# Patient Record
Sex: Female | Born: 1935
Health system: Southern US, Community
[De-identification: ages and names within clinical notes are randomized; demographics above are authoritative.]

## PROBLEM LIST (undated history)

## (undated) DIAGNOSIS — R011 Cardiac murmur, unspecified: Secondary | ICD-10-CM

## (undated) DIAGNOSIS — L608 Other nail disorders: Secondary | ICD-10-CM

## (undated) DIAGNOSIS — M1990 Unspecified osteoarthritis, unspecified site: Secondary | ICD-10-CM

## (undated) DIAGNOSIS — E1129 Type 2 diabetes mellitus with other diabetic kidney complication: Secondary | ICD-10-CM

## (undated) DIAGNOSIS — E875 Hyperkalemia: Secondary | ICD-10-CM

## (undated) DIAGNOSIS — N189 Chronic kidney disease, unspecified: Secondary | ICD-10-CM

## (undated) DIAGNOSIS — G4700 Insomnia, unspecified: Secondary | ICD-10-CM

## (undated) DIAGNOSIS — I739 Peripheral vascular disease, unspecified: Secondary | ICD-10-CM

## (undated) DIAGNOSIS — I10 Essential (primary) hypertension: Secondary | ICD-10-CM

## (undated) DIAGNOSIS — E785 Hyperlipidemia, unspecified: Secondary | ICD-10-CM

## (undated) DIAGNOSIS — R5381 Other malaise: Secondary | ICD-10-CM

## (undated) DIAGNOSIS — N390 Urinary tract infection, site not specified: Secondary | ICD-10-CM

## (undated) DIAGNOSIS — E11319 Type 2 diabetes mellitus with unspecified diabetic retinopathy without macular edema: Secondary | ICD-10-CM

## (undated) DIAGNOSIS — R5383 Other fatigue: Secondary | ICD-10-CM

## (undated) DIAGNOSIS — F419 Anxiety disorder, unspecified: Secondary | ICD-10-CM

## (undated) DIAGNOSIS — R112 Nausea with vomiting, unspecified: Secondary | ICD-10-CM

## (undated) DIAGNOSIS — R609 Edema, unspecified: Secondary | ICD-10-CM

## (undated) DIAGNOSIS — M13169 Monoarthritis, not elsewhere classified, unspecified knee: Secondary | ICD-10-CM

## (undated) DIAGNOSIS — F411 Generalized anxiety disorder: Secondary | ICD-10-CM

## (undated) DIAGNOSIS — S42309A Unspecified fracture of shaft of humerus, unspecified arm, initial encounter for closed fracture: Secondary | ICD-10-CM

## (undated) DIAGNOSIS — I8390 Asymptomatic varicose veins of unspecified lower extremity: Secondary | ICD-10-CM

## (undated) DIAGNOSIS — H9190 Unspecified hearing loss, unspecified ear: Secondary | ICD-10-CM

## (undated) DIAGNOSIS — R7989 Other specified abnormal findings of blood chemistry: Secondary | ICD-10-CM

## (undated) DIAGNOSIS — E042 Nontoxic multinodular goiter: Secondary | ICD-10-CM

## (undated) DIAGNOSIS — E663 Overweight: Secondary | ICD-10-CM

## (undated) DIAGNOSIS — N289 Disorder of kidney and ureter, unspecified: Secondary | ICD-10-CM

## (undated) DIAGNOSIS — Z9889 Other specified postprocedural states: Secondary | ICD-10-CM

## (undated) DIAGNOSIS — M25569 Pain in unspecified knee: Secondary | ICD-10-CM

## (undated) DIAGNOSIS — M25559 Pain in unspecified hip: Secondary | ICD-10-CM

## (undated) HISTORY — DX: Pain in unspecified knee: M25.569

## (undated) HISTORY — DX: Chronic kidney disease, unspecified: N18.9

## (undated) HISTORY — DX: Disorder of kidney and ureter, unspecified: N28.9

## (undated) HISTORY — DX: Hyperlipidemia, unspecified: E78.5

## (undated) HISTORY — DX: Hyperkalemia: E87.5

## (undated) HISTORY — DX: Pain in unspecified hip: M25.559

## (undated) HISTORY — DX: Type 2 diabetes mellitus with other diabetic kidney complication: E11.29

## (undated) HISTORY — DX: Urinary tract infection, site not specified: N39.0

## (undated) HISTORY — DX: Insomnia, unspecified: G47.00

## (undated) HISTORY — DX: Unspecified osteoarthritis, unspecified site: M19.90

## (undated) HISTORY — DX: Type 2 diabetes mellitus with unspecified diabetic retinopathy without macular edema: E11.319

## (undated) HISTORY — PX: LIPOMA EXCISION: SHX5283

## (undated) HISTORY — DX: Cardiac murmur, unspecified: R01.1

## (undated) HISTORY — DX: Unspecified fracture of shaft of humerus, unspecified arm, initial encounter for closed fracture: S42.309A

## (undated) HISTORY — DX: Other malaise: R53.83

## (undated) HISTORY — DX: Edema, unspecified: R60.9

## (undated) HISTORY — PX: EYE SURGERY: SHX253

## (undated) HISTORY — DX: Overweight: E66.3

## (undated) HISTORY — DX: Other malaise: R53.81

## (undated) HISTORY — DX: Other nail disorders: L60.8

## (undated) HISTORY — DX: Generalized anxiety disorder: F41.1

## (undated) HISTORY — PX: ABDOMINAL HYSTERECTOMY: SHX81

## (undated) HISTORY — DX: Other specified abnormal findings of blood chemistry: R79.89

## (undated) HISTORY — DX: Asymptomatic varicose veins of unspecified lower extremity: I83.90

## (undated) HISTORY — PX: TONSILLECTOMY: SUR1361

---
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
## 2004-10-27 ENCOUNTER — Ambulatory Visit: Payer: Self-pay

## 2008-10-13 ENCOUNTER — Emergency Department (HOSPITAL_COMMUNITY): Admission: EM | Admit: 2008-10-13 | Discharge: 2008-10-13 | Payer: Self-pay | Admitting: Emergency Medicine

## 2012-06-24 ENCOUNTER — Other Ambulatory Visit: Payer: Self-pay | Admitting: Internal Medicine

## 2012-06-24 DIAGNOSIS — I999 Unspecified disorder of circulatory system: Secondary | ICD-10-CM

## 2012-06-27 ENCOUNTER — Ambulatory Visit
Admission: RE | Admit: 2012-06-27 | Discharge: 2012-06-27 | Disposition: A | Discharge status: Home / Self Care | Payer: Medicare Other | Source: Ambulatory Visit | Attending: Internal Medicine | Admitting: Internal Medicine

## 2012-06-27 DIAGNOSIS — I999 Unspecified disorder of circulatory system: Secondary | ICD-10-CM

## 2012-09-16 ENCOUNTER — Ambulatory Visit
Admission: RE | Admit: 2012-09-16 | Discharge: 2012-09-16 | Disposition: A | Discharge status: Home / Self Care | Payer: Medicare Other | Source: Ambulatory Visit | Attending: Internal Medicine | Admitting: Internal Medicine

## 2012-09-16 ENCOUNTER — Other Ambulatory Visit: Payer: Self-pay | Admitting: Internal Medicine

## 2012-09-16 DIAGNOSIS — R52 Pain, unspecified: Secondary | ICD-10-CM

## 2012-09-16 DIAGNOSIS — R609 Edema, unspecified: Secondary | ICD-10-CM

## 2012-11-08 ENCOUNTER — Other Ambulatory Visit (HOSPITAL_COMMUNITY): Payer: Self-pay | Admitting: Orthopaedic Surgery

## 2012-11-12 ENCOUNTER — Encounter (HOSPITAL_COMMUNITY): Payer: Self-pay | Admitting: Pharmacy Technician

## 2012-11-15 NOTE — Patient Instructions (Addendum)
[place]  11/15/2012   Your procedure is scheduled on:  11/29/12 FRIDAY  Report to [place] at  [place]    AM.  Call this number if you have problems the morning of surgery: [phone]     DO NOT TAKE ANY DIABETES MEDICINES MORNING OF SURGERY  Remember:   Do not eat food  Or drink :After Midnight.THURSDAY NIGHT   Take these medicines the morning of surgery with A SIP OF WATER: ATENOLOL   .  Contacts, dentures or partial plates can not be worn to surgery  Leave suitcase in the car. After surgery it may be brought to your room.  For patients admitted to the hospital, checkout time is 11:00 AM day of  discharge.             SPECIAL INSTRUCTIONS- SEE [place] PREPARING FOR SURGERY INSTRUCTION SHEET-     DO NOT WEAR JEWELRY, LOTIONS, POWDERS, OR PERFUMES.  WOMEN-- DO NOT SHAVE LEGS OR UNDERARMS FOR 12 HOURS BEFORE SHOWERS. MEN MAY SHAVE FACE.  Patients discharged the day of surgery will not be allowed to drive home. IF going home the day of surgery, you must have a driver and someone to stay with you for the first 24 hours  Name and phone number of your driver:  admission                                                                      Please read over the following fact sheets that you were given: MRSA Information, Incentive Spirometry Sheet, Blood Transfusion Sheet  Information                                                                                   [name]  PST 336  [phone]                 [place]                                                  Patient Signature _____________________________

## 2012-11-18 ENCOUNTER — Ambulatory Visit (HOSPITAL_COMMUNITY)
Admission: RE | Admit: 2012-11-18 | Discharge: 2012-11-18 | Disposition: A | Discharge status: Home / Self Care | Payer: Medicare Other | Source: Ambulatory Visit | Attending: Orthopaedic Surgery | Admitting: Orthopaedic Surgery

## 2012-11-18 ENCOUNTER — Encounter (HOSPITAL_COMMUNITY): Payer: Self-pay

## 2012-11-18 ENCOUNTER — Encounter (HOSPITAL_COMMUNITY)
Admission: RE | Admit: 2012-11-18 | Discharge: 2012-11-18 | Disposition: A | Discharge status: Home / Self Care | Payer: Medicare Other | Source: Ambulatory Visit | Attending: Orthopaedic Surgery | Admitting: Orthopaedic Surgery

## 2012-11-18 DIAGNOSIS — I700 Atherosclerosis of aorta: Secondary | ICD-10-CM | POA: Insufficient documentation

## 2012-11-18 DIAGNOSIS — Z01810 Encounter for preprocedural cardiovascular examination: Secondary | ICD-10-CM | POA: Insufficient documentation

## 2012-11-18 DIAGNOSIS — Z01812 Encounter for preprocedural laboratory examination: Secondary | ICD-10-CM | POA: Insufficient documentation

## 2012-11-18 DIAGNOSIS — R9431 Abnormal electrocardiogram [ECG] [EKG]: Secondary | ICD-10-CM | POA: Insufficient documentation

## 2012-11-18 DIAGNOSIS — Z01818 Encounter for other preprocedural examination: Secondary | ICD-10-CM | POA: Insufficient documentation

## 2012-11-18 HISTORY — DX: Peripheral vascular disease, unspecified: I73.9

## 2012-11-18 HISTORY — DX: Essential (primary) hypertension: I10

## 2012-11-18 HISTORY — DX: Anxiety disorder, unspecified: F41.9

## 2012-11-18 HISTORY — DX: Unspecified osteoarthritis, unspecified site: M19.90

## 2012-11-18 LAB — CBC
HCT: 36.2 % (ref 36.0–46.0)
Hemoglobin: 12.2 g/dL (ref 12.0–15.0)
MCH: 30.7 pg (ref 26.0–34.0)
MCHC: 33.7 g/dL (ref 30.0–36.0)

## 2012-11-18 LAB — PROTIME-INR: INR: 0.88 (ref 0.00–1.49)

## 2012-11-18 LAB — BASIC METABOLIC PANEL
BUN: 22 mg/dL (ref 6–23)
Chloride: 103 mEq/L (ref 96–112)
Glucose, Bld: 129 mg/dL — ABNORMAL HIGH (ref 70–99)
Potassium: 4.2 mEq/L (ref 3.5–5.1)

## 2012-11-18 LAB — APTT: aPTT: 29 seconds (ref 24–37)

## 2012-11-18 LAB — URINE MICROSCOPIC-ADD ON

## 2012-11-18 LAB — URINALYSIS, ROUTINE W REFLEX MICROSCOPIC
Bilirubin Urine: NEGATIVE
Glucose, UA: NEGATIVE mg/dL
Ketones, ur: NEGATIVE mg/dL
Protein, ur: NEGATIVE mg/dL

## 2012-11-18 LAB — ABO/RH: ABO/RH(D): A NEG

## 2012-11-18 NOTE — Progress Notes (Signed)
Faxed u/a with micro to Dr Ninfa Linden with confirmation= also notified Judeen Hammans at his office of impending fax

## 2012-11-28 NOTE — Anesthesia Preprocedure Evaluation (Addendum)
Anesthesia Evaluation  Patient identified by MRN, date of birth, ID band Patient awake    Reviewed: Allergy & Precautions, H&P , NPO status , Patient's Chart, lab work & pertinent test results  Airway Mallampati: III TM Distance: >3 FB Neck ROM: Full    Dental  (+) Edentulous Upper and Partial Lower   Pulmonary neg pulmonary ROS,  breath sounds clear to auscultation  Pulmonary exam normal       Cardiovascular hypertension, Pt. on medications and Pt. on home beta blockers + Peripheral Vascular Disease Rhythm:Regular Rate:Normal     Neuro/Psych Anxiety negative neurological ROS     GI/Hepatic negative GI ROS, Neg liver ROS,   Endo/Other  diabetes, Type 2, Oral Hypoglycemic Agents  Renal/GU negative Renal ROS  negative genitourinary   Musculoskeletal negative musculoskeletal ROS (+)   Abdominal   Peds  Hematology negative hematology ROS (+)   Anesthesia Other Findings   Reproductive/Obstetrics                          Anesthesia Physical Anesthesia Plan  ASA: II  Anesthesia Plan: General   Post-op Pain Management:    Induction: Intravenous  Airway Management Planned: Oral ETT  Additional Equipment:   Intra-op Plan:   Post-operative Plan: Extubation in OR  Informed Consent: I have reviewed the patients History and Physical, chart, labs and discussed the procedure including the risks, benefits and alternatives for the proposed anesthesia with the patient or authorized representative who has indicated his/her understanding and acceptance.   Dental advisory given  Plan Discussed with: CRNA  Anesthesia Plan Comments:         Anesthesia Quick Evaluation

## 2012-11-29 ENCOUNTER — Inpatient Hospital Stay (HOSPITAL_COMMUNITY): Payer: Medicare Other

## 2012-11-29 ENCOUNTER — Encounter (HOSPITAL_COMMUNITY): Payer: Self-pay | Admitting: *Deleted

## 2012-11-29 ENCOUNTER — Inpatient Hospital Stay (HOSPITAL_COMMUNITY)
Admission: RE | Admit: 2012-11-29 | Discharge: 2012-12-02 | DRG: 470 | Disposition: A | Discharge status: Home Health | Payer: Medicare Other | Source: Ambulatory Visit | Attending: Orthopaedic Surgery | Admitting: Orthopaedic Surgery

## 2012-11-29 ENCOUNTER — Encounter (HOSPITAL_COMMUNITY): Payer: Self-pay | Admitting: Anesthesiology

## 2012-11-29 ENCOUNTER — Encounter (HOSPITAL_COMMUNITY)
Admission: RE | Disposition: A | Discharge status: Home Health | Payer: Self-pay | Source: Ambulatory Visit | Attending: Orthopaedic Surgery

## 2012-11-29 ENCOUNTER — Inpatient Hospital Stay (HOSPITAL_COMMUNITY): Payer: Medicare Other | Admitting: Anesthesiology

## 2012-11-29 DIAGNOSIS — F411 Generalized anxiety disorder: Secondary | ICD-10-CM | POA: Diagnosis present

## 2012-11-29 DIAGNOSIS — M169 Osteoarthritis of hip, unspecified: Secondary | ICD-10-CM

## 2012-11-29 DIAGNOSIS — M1610 Unilateral primary osteoarthritis, unspecified hip: Principal | ICD-10-CM | POA: Diagnosis present

## 2012-11-29 DIAGNOSIS — D62 Acute posthemorrhagic anemia: Secondary | ICD-10-CM | POA: Diagnosis not present

## 2012-11-29 DIAGNOSIS — I10 Essential (primary) hypertension: Secondary | ICD-10-CM | POA: Diagnosis present

## 2012-11-29 DIAGNOSIS — E119 Type 2 diabetes mellitus without complications: Secondary | ICD-10-CM | POA: Diagnosis present

## 2012-11-29 HISTORY — PX: TOTAL HIP ARTHROPLASTY: SHX124

## 2012-11-29 LAB — GLUCOSE, CAPILLARY
Glucose-Capillary: 120 mg/dL — ABNORMAL HIGH (ref 70–99)
Glucose-Capillary: 144 mg/dL — ABNORMAL HIGH (ref 70–99)
Glucose-Capillary: 218 mg/dL — ABNORMAL HIGH (ref 70–99)
Glucose-Capillary: 237 mg/dL — ABNORMAL HIGH (ref 70–99)
Glucose-Capillary: 284 mg/dL — ABNORMAL HIGH (ref 70–99)

## 2012-11-29 SURGERY — ARTHROPLASTY, HIP, TOTAL, ANTERIOR APPROACH
Anesthesia: General | Site: Hip | Laterality: Right | Wound class: Clean

## 2012-11-29 MED ORDER — HYDROMORPHONE HCL PF 1 MG/ML IJ SOLN
INTRAMUSCULAR | Status: DC | PRN
  Administered 2012-11-29 (×2): 1 mg via INTRAVENOUS

## 2012-11-29 MED ORDER — METHOCARBAMOL 100 MG/ML IJ SOLN: 500.0000 mg | Freq: Four times a day (QID) | INTRAVENOUS | Status: DC | PRN

## 2012-11-29 MED ORDER — FENTANYL CITRATE 0.05 MG/ML IJ SOLN
INTRAMUSCULAR | Status: DC | PRN
  Administered 2012-11-29 (×2): 25 ug via INTRAVENOUS

## 2012-11-29 MED ORDER — METOCLOPRAMIDE HCL 5 MG/ML IJ SOLN: 5.0000 mg | Freq: Three times a day (TID) | INTRAMUSCULAR | Status: DC | PRN

## 2012-11-29 MED ORDER — SODIUM CHLORIDE 0.9 % IR SOLN
Status: DC | PRN
  Administered 2012-11-29: 1000 mL

## 2012-11-29 MED ORDER — ACETAMINOPHEN 325 MG PO TABS: 650.0000 mg | ORAL_TABLET | Freq: Four times a day (QID) | ORAL | Status: DC | PRN

## 2012-11-29 MED ORDER — FERROUS SULFATE 325 (65 FE) MG PO TABS
325.0000 mg | ORAL_TABLET | Freq: Three times a day (TID) | ORAL | Status: DC
  Administered 2012-11-29 – 2012-12-02 (×9): 325 mg via ORAL
  Filled 2012-11-29 (×11): qty 1

## 2012-11-29 MED ORDER — LACTATED RINGERS IV SOLN
INTRAVENOUS | Status: DC | PRN
  Administered 2012-11-29 (×3): via INTRAVENOUS

## 2012-11-29 MED ORDER — PHENYLEPHRINE HCL 10 MG/ML IJ SOLN
INTRAMUSCULAR | Status: DC | PRN
  Administered 2012-11-29 (×2): 40 ug via INTRAVENOUS

## 2012-11-29 MED ORDER — ATENOLOL 50 MG PO TABS
75.0000 mg | ORAL_TABLET | Freq: Two times a day (BID) | ORAL | Status: DC
  Administered 2012-11-29 – 2012-12-02 (×5): 75 mg via ORAL
  Filled 2012-11-29 (×8): qty 1

## 2012-11-29 MED ORDER — GLIPIZIDE 5 MG PO TABS
5.0000 mg | ORAL_TABLET | Freq: Two times a day (BID) | ORAL | Status: DC
  Administered 2012-11-29 – 2012-12-01 (×5): 5 mg via ORAL
  Filled 2012-11-29 (×8): qty 1

## 2012-11-29 MED ORDER — SODIUM CHLORIDE 0.9 % IV SOLN
INTRAVENOUS | Status: DC
  Administered 2012-11-29: 75 mL/h via INTRAVENOUS
  Administered 2012-11-30 (×2): via INTRAVENOUS

## 2012-11-29 MED ORDER — DOCUSATE SODIUM 100 MG PO CAPS
100.0000 mg | ORAL_CAPSULE | Freq: Two times a day (BID) | ORAL | Status: DC
  Administered 2012-11-29 – 2012-12-02 (×7): 100 mg via ORAL

## 2012-11-29 MED ORDER — HYDRALAZINE HCL 20 MG/ML IJ SOLN
INTRAMUSCULAR | Status: DC | PRN
  Administered 2012-11-29 (×2): 4 mg via INTRAVENOUS

## 2012-11-29 MED ORDER — ZOLPIDEM TARTRATE 5 MG PO TABS: 5.0000 mg | ORAL_TABLET | Freq: Every evening | ORAL | Status: DC | PRN

## 2012-11-29 MED ORDER — FUROSEMIDE 20 MG PO TABS
20.0000 mg | ORAL_TABLET | Freq: Every morning | ORAL | Status: DC
Start: 2012-11-29 — End: 2012-12-02
  Administered 2012-11-29: 20 mg via ORAL
  Filled 2012-11-29 (×4): qty 1

## 2012-11-29 MED ORDER — ACETAMINOPHEN 650 MG RE SUPP: 650.0000 mg | Freq: Four times a day (QID) | RECTAL | Status: DC | PRN

## 2012-11-29 MED ORDER — MIDAZOLAM HCL 5 MG/5ML IJ SOLN
INTRAMUSCULAR | Status: DC | PRN
  Administered 2012-11-29 (×2): 1 mg via INTRAVENOUS

## 2012-11-29 MED ORDER — ACETAMINOPHEN-CODEINE #3 300-30 MG PO TABS
1.0000 | ORAL_TABLET | ORAL | Status: DC | PRN
  Administered 2012-12-01: 1 via ORAL
  Administered 2012-12-02: 2 via ORAL
  Filled 2012-11-29: qty 1
  Filled 2012-11-29: qty 2

## 2012-11-29 MED ORDER — INSULIN ASPART 100 UNIT/ML ~~LOC~~ SOLN
0.0000 [IU] | Freq: Every day | SUBCUTANEOUS | Status: DC
  Administered 2012-11-29: 3 [IU] via SUBCUTANEOUS

## 2012-11-29 MED ORDER — LOSARTAN POTASSIUM 25 MG PO TABS
25.0000 mg | ORAL_TABLET | Freq: Two times a day (BID) | ORAL | Status: DC
  Administered 2012-11-29 – 2012-12-01 (×3): 25 mg via ORAL
  Filled 2012-11-29 (×8): qty 1

## 2012-11-29 MED ORDER — LACTATED RINGERS IV SOLN: INTRAVENOUS | Status: DC

## 2012-11-29 MED ORDER — DEXAMETHASONE SODIUM PHOSPHATE 4 MG/ML IJ SOLN
INTRAMUSCULAR | Status: DC | PRN
  Administered 2012-11-29: 10 mg via INTRAVENOUS

## 2012-11-29 MED ORDER — METOCLOPRAMIDE HCL 5 MG/ML IJ SOLN
INTRAMUSCULAR | Status: DC | PRN
  Administered 2012-11-29 (×2): 5 mg via INTRAVENOUS

## 2012-11-29 MED ORDER — METFORMIN HCL 500 MG PO TABS
1000.0000 mg | ORAL_TABLET | Freq: Two times a day (BID) | ORAL | Status: DC
  Administered 2012-11-29 – 2012-12-01 (×5): 1000 mg via ORAL
  Filled 2012-11-29 (×8): qty 2

## 2012-11-29 MED ORDER — EPHEDRINE SULFATE 50 MG/ML IJ SOLN
INTRAMUSCULAR | Status: DC | PRN
  Administered 2012-11-29: 10 mg via INTRAVENOUS
  Administered 2012-11-29: 5 mg via INTRAVENOUS
  Administered 2012-11-29: 10 mg via INTRAVENOUS
  Administered 2012-11-29: 5 mg via INTRAVENOUS

## 2012-11-29 MED ORDER — METHOCARBAMOL 500 MG PO TABS
500.0000 mg | ORAL_TABLET | Freq: Four times a day (QID) | ORAL | Status: DC | PRN
  Administered 2012-11-29 – 2012-12-01 (×5): 500 mg via ORAL
  Filled 2012-11-29 (×6): qty 1

## 2012-11-29 MED ORDER — CEFAZOLIN SODIUM 1-5 GM-% IV SOLN
1.0000 g | Freq: Four times a day (QID) | INTRAVENOUS | Status: AC
  Administered 2012-11-29 (×2): 1 g via INTRAVENOUS
  Filled 2012-11-29 (×2): qty 50

## 2012-11-29 MED ORDER — PHENOL 1.4 % MT LIQD: 1.0000 | OROMUCOSAL | Status: DC | PRN

## 2012-11-29 MED ORDER — PROPOFOL INFUSION 10 MG/ML OPTIME
INTRAVENOUS | Status: DC | PRN
  Administered 2012-11-29: 75 ug/kg/min via INTRAVENOUS

## 2012-11-29 MED ORDER — ONDANSETRON HCL 4 MG PO TABS
4.0000 mg | ORAL_TABLET | Freq: Four times a day (QID) | ORAL | Status: DC | PRN
  Administered 2012-12-01: 4 mg via ORAL
  Filled 2012-11-29: qty 1

## 2012-11-29 MED ORDER — CEFAZOLIN SODIUM-DEXTROSE 2-3 GM-% IV SOLR
2.0000 g | INTRAVENOUS | Status: AC
  Administered 2012-11-29: 2 g via INTRAVENOUS

## 2012-11-29 MED ORDER — POLYVINYL ALCOHOL 1.4 % OP SOLN
1.0000 [drp] | Freq: Two times a day (BID) | OPHTHALMIC | Status: DC | PRN
  Filled 2012-11-29 (×2): qty 15

## 2012-11-29 MED ORDER — DIPHENHYDRAMINE HCL 12.5 MG/5ML PO ELIX: 12.5000 mg | ORAL_SOLUTION | ORAL | Status: DC | PRN

## 2012-11-29 MED ORDER — HYDROMORPHONE HCL PF 1 MG/ML IJ SOLN: 1.0000 mg | INTRAMUSCULAR | Status: DC | PRN

## 2012-11-29 MED ORDER — 0.9 % SODIUM CHLORIDE (POUR BTL) OPTIME
TOPICAL | Status: DC | PRN
  Administered 2012-11-29: 1000 mL

## 2012-11-29 MED ORDER — ACETAMINOPHEN 10 MG/ML IV SOLN
INTRAVENOUS | Status: DC | PRN
  Administered 2012-11-29: 1000 mg via INTRAVENOUS

## 2012-11-29 MED ORDER — ATORVASTATIN CALCIUM 20 MG PO TABS
20.0000 mg | ORAL_TABLET | Freq: Every day | ORAL | Status: DC
  Administered 2012-11-29 – 2012-12-01 (×3): 20 mg via ORAL
  Filled 2012-11-29 (×4): qty 1

## 2012-11-29 MED ORDER — INSULIN ASPART 100 UNIT/ML ~~LOC~~ SOLN
0.0000 [IU] | Freq: Three times a day (TID) | SUBCUTANEOUS | Status: DC
  Administered 2012-11-30: 3 [IU] via SUBCUTANEOUS
  Administered 2012-11-30 – 2012-12-01 (×4): 2 [IU] via SUBCUTANEOUS
  Administered 2012-12-01: 3 [IU] via SUBCUTANEOUS

## 2012-11-29 MED ORDER — OXYCODONE HCL ER 10 MG PO T12A
10.0000 mg | EXTENDED_RELEASE_TABLET | Freq: Two times a day (BID) | ORAL | Status: DC
  Administered 2012-11-29 – 2012-12-02 (×6): 10 mg via ORAL
  Filled 2012-11-29 (×6): qty 1

## 2012-11-29 MED ORDER — FENTANYL CITRATE 0.05 MG/ML IJ SOLN: 25.0000 ug | INTRAMUSCULAR | Status: DC | PRN

## 2012-11-29 MED ORDER — RIVAROXABAN 10 MG PO TABS
10.0000 mg | ORAL_TABLET | Freq: Every day | ORAL | Status: DC
  Administered 2012-11-30 – 2012-12-02 (×3): 10 mg via ORAL
  Filled 2012-11-29 (×4): qty 1

## 2012-11-29 MED ORDER — ONDANSETRON HCL 4 MG/2ML IJ SOLN
INTRAMUSCULAR | Status: DC | PRN
  Administered 2012-11-29: 4 mg via INTRAVENOUS

## 2012-11-29 MED ORDER — PROPOFOL 10 MG/ML IV BOLUS
INTRAVENOUS | Status: DC | PRN
  Administered 2012-11-29: 150 mg via INTRAVENOUS

## 2012-11-29 MED ORDER — ROCURONIUM BROMIDE 100 MG/10ML IV SOLN
INTRAVENOUS | Status: DC | PRN
  Administered 2012-11-29: 50 mg via INTRAVENOUS

## 2012-11-29 MED ORDER — METOCLOPRAMIDE HCL 5 MG PO TABS
5.0000 mg | ORAL_TABLET | Freq: Three times a day (TID) | ORAL | Status: DC | PRN
  Filled 2012-11-29: qty 2

## 2012-11-29 MED ORDER — OXYCODONE HCL 5 MG PO TABS
5.0000 mg | ORAL_TABLET | ORAL | Status: DC | PRN
  Administered 2012-11-29 – 2012-11-30 (×4): 5 mg via ORAL
  Administered 2012-11-30 (×3): 10 mg via ORAL
  Administered 2012-11-30: 5 mg via ORAL
  Administered 2012-11-30 – 2012-12-01 (×3): 10 mg via ORAL
  Administered 2012-12-01: 5 mg via ORAL
  Filled 2012-11-29 (×2): qty 2
  Filled 2012-11-29 (×2): qty 1
  Filled 2012-11-29: qty 2
  Filled 2012-11-29 (×3): qty 1
  Filled 2012-11-29: qty 2
  Filled 2012-11-29: qty 1
  Filled 2012-11-29 (×2): qty 2

## 2012-11-29 MED ORDER — MENTHOL 3 MG MT LOZG: 1.0000 | LOZENGE | OROMUCOSAL | Status: DC | PRN

## 2012-11-29 MED ORDER — GLYCOPYRROLATE 0.2 MG/ML IJ SOLN
INTRAMUSCULAR | Status: DC | PRN
  Administered 2012-11-29: 0.3 mg via INTRAVENOUS

## 2012-11-29 MED ORDER — ONDANSETRON HCL 4 MG/2ML IJ SOLN
4.0000 mg | Freq: Four times a day (QID) | INTRAMUSCULAR | Status: DC | PRN
  Administered 2012-11-29: 4 mg via INTRAVENOUS
  Filled 2012-11-29: qty 2

## 2012-11-29 MED ORDER — NEOSTIGMINE METHYLSULFATE 1 MG/ML IJ SOLN
INTRAMUSCULAR | Status: DC | PRN
  Administered 2012-11-29: 3 mg via INTRAVENOUS

## 2012-11-29 SURGICAL SUPPLY — 37 items
ADH SKN CLS APL DERMABOND .7 (GAUZE/BANDAGES/DRESSINGS) ×1
BAG SPEC THK2 15X12 ZIP CLS (MISCELLANEOUS) ×1
BAG ZIPLOCK 12X15 (MISCELLANEOUS) ×2 IMPLANT
BLADE SAW SGTL 18X1.27X75 (BLADE) ×2 IMPLANT
CELLS DAT CNTRL 66122 CELL SVR (MISCELLANEOUS) ×1 IMPLANT
CLOTH BEACON ORANGE TIMEOUT ST (SAFETY) ×2 IMPLANT
DERMABOND ADVANCED (GAUZE/BANDAGES/DRESSINGS) ×1
DERMABOND ADVANCED .7 DNX12 (GAUZE/BANDAGES/DRESSINGS) ×1 IMPLANT
DRAPE C-ARM 42X72 X-RAY (DRAPES) ×2 IMPLANT
DRAPE STERI IOBAN 125X83 (DRAPES) ×2 IMPLANT
DRAPE U-SHAPE 47X51 STRL (DRAPES) ×6 IMPLANT
DRSG AQUACEL AG ADV 3.5X10 (GAUZE/BANDAGES/DRESSINGS) ×2 IMPLANT
DURAPREP 26ML APPLICATOR (WOUND CARE) ×2 IMPLANT
ELECT BLADE TIP CTD 4 INCH (ELECTRODE) ×2 IMPLANT
ELECT REM PT RETURN 9FT ADLT (ELECTROSURGICAL) ×2
ELECTRODE REM PT RTRN 9FT ADLT (ELECTROSURGICAL) ×1 IMPLANT
FACESHIELD LNG OPTICON STERILE (SAFETY) ×8 IMPLANT
GLOVE BIO SURGEON STRL SZ7.5 (GLOVE) ×2 IMPLANT
GLOVE BIOGEL PI IND STRL 8 (GLOVE) ×3 IMPLANT
GLOVE BIOGEL PI INDICATOR 8 (GLOVE) ×2
GLOVE ECLIPSE 8.0 STRL XLNG CF (GLOVE) ×3 IMPLANT
GOWN STRL REIN XL XLG (GOWN DISPOSABLE) ×4 IMPLANT
HANDPIECE INTERPULSE COAX TIP (DISPOSABLE) ×2
KIT BASIN OR (CUSTOM PROCEDURE TRAY) ×2 IMPLANT
PACK TOTAL JOINT (CUSTOM PROCEDURE TRAY) ×2 IMPLANT
PADDING CAST COTTON 6X4 STRL (CAST SUPPLIES) ×2 IMPLANT
RTRCTR WOUND ALEXIS 18CM MED (MISCELLANEOUS) ×2
SET HNDPC FAN SPRY TIP SCT (DISPOSABLE) ×1 IMPLANT
SUT ETHIBOND NAB CT1 #1 30IN (SUTURE) ×2 IMPLANT
SUT MNCRL AB 4-0 PS2 18 (SUTURE) ×2 IMPLANT
SUT NYLON 3 0 (SUTURE) ×2 IMPLANT
SUT VIC AB 1 CT1 36 (SUTURE) ×4 IMPLANT
SUT VIC AB 2-0 CT1 27 (SUTURE) ×4
SUT VIC AB 2-0 CT1 TAPERPNT 27 (SUTURE) ×2 IMPLANT
TOWEL OR 17X26 10 PK STRL BLUE (TOWEL DISPOSABLE) ×4 IMPLANT
TOWEL OR NON WOVEN STRL DISP B (DISPOSABLE) ×2 IMPLANT
TRAY FOLEY CATH 14FRSI W/METER (CATHETERS) ×2 IMPLANT

## 2012-11-29 NOTE — Transfer of Care (Signed)
Immediate Anesthesia Transfer of Care Note  Patient: Julie Baxter  Procedure(s) Performed: Procedure(s): RIGHT TOTAL HIP ARTHROPLASTY ANTERIOR APPROACH (Right)  Patient Location: PACU  Anesthesia Type:General  Level of Consciousness: awake, alert , oriented, patient cooperative and responds to stimulation  Airway & Oxygen Therapy: Patient Spontanous Breathing and Patient connected to face mask  Post-op Assessment: Report given to PACU RN, Post -op Vital signs reviewed and stable and Patient moving all extremities  Post vital signs: Reviewed and stable  Complications: No apparent anesthesia complications

## 2012-11-29 NOTE — Progress Notes (Signed)
Utilization review completed.  

## 2012-11-29 NOTE — H&P (Signed)
TOTAL HIP ADMISSION H&P  Patient is admitted for right total hip arthroplasty.  Subjective:  Chief Complaint: right hip pain  HPI: [place], 77 y.o. female, has a history of pain and functional disability in the right hip(s) due to arthritis and patient has failed non-surgical conservative treatments for greater than 12 weeks to include NSAID's and/or analgesics, use of assistive devices and activity modification.  Onset of symptoms was abrupt starting 1 years ago with rapidlly worsening course since that time.The patient noted no past surgery on the right hip(s).  Patient currently rates pain in the right hip at 9 out of 10 with activity. Patient has night pain, worsening of pain with activity and weight bearing, trendelenberg gait, pain that interfers with activities of daily living, pain with passive range of motion and crepitus. Patient has evidence of subchondral cysts, subchondral sclerosis, periarticular osteophytes and joint space narrowing by imaging studies. This condition presents safety issues increasing the risk of falls.  There is no current active infection.  Patient Active Problem List   Diagnosis Date Noted  . Degenerative arthritis of hip 11/29/2012   Past Medical History  Diagnosis Date  . Hypertension   . Anxiety     regarding surgery  . Diabetes mellitus without complication   . Peripheral vascular disease     varicose veins-  "Blood Clot anterior RIGHT LEG WITH PREGNANCY"  . Arthritis     Past Surgical History  Procedure Laterality Date  . Abdominal hysterectomy    . Eye surgery Bilateral     cataract extraction with IOL  . Spinal meningitis  1957  . Tonsillectomy    . Lipoma excision      x2  bilateral buttocks    Prescriptions prior to admission  Medication Sig Dispense Refill  . aspirin EC 81 MG tablet Take 81 mg by mouth every evening.      Marland Kitchen atenolol (TENORMIN) 50 MG tablet Take 75 mg by mouth 2 (two) times daily.      . furosemide (LASIX) 20  MG tablet Take 20 mg by mouth every morning.      Marland Kitchen glipiZIDE (GLUCOTROL) 5 MG tablet Take 5 mg by mouth 2 (two) times daily before a meal.      . losartan (COZAAR) 25 MG tablet Take 25 mg by mouth 2 (two) times daily.      . metFORMIN (GLUCOPHAGE) 500 MG tablet Take 1,000 mg by mouth 2 (two) times daily with a meal.      . methylcellulose (ARTIFICIAL TEARS) 1 % ophthalmic solution Place 1 drop into both eyes 2 (two) times daily as needed (dry eyes).      . rosuvastatin (CRESTOR) 10 MG tablet Take 10 mg by mouth every evening.      Marland Kitchen OVER THE COUNTER MEDICATION Take 3 tablets by mouth daily as needed. Restful legs       Allergies  Allergen Reactions  . Naproxen Swelling and Other (See Comments)    Blacked out   . Tramadol Nausea And Vomiting    History  Substance Use Topics  . Smoking status: Never Smoker   . Smokeless tobacco: Never Used  . Alcohol Use: No    History reviewed. No pertinent family history.   Review of Systems  Musculoskeletal: Positive for joint pain.  All other systems reviewed and are negative.    Objective:  Physical Exam  Constitutional: She is oriented to person, place, and time. She appears well-developed and well-nourished.  HENT:  Head: Normocephalic and atraumatic.  Eyes: EOM are normal. Pupils are equal, round, and reactive to light.  Neck: Normal range of motion. Neck supple.  Cardiovascular: Normal rate and regular rhythm.   Respiratory: Effort normal and breath sounds normal.  GI: Soft. Bowel sounds are normal.  Musculoskeletal:       Right hip: She exhibits decreased range of motion, decreased strength, bony tenderness and crepitus.  Neurological: She is alert and oriented to person, place, and time.  Skin: Skin is warm and dry.  Psychiatric: She has a normal mood and affect.    Vital signs in last 24 hours: Temp:  [98.3 F (36.8 C)] 98.3 F (36.8 C) (04/04 0527) Pulse Rate:  [71] 71 (04/04 0527) Resp:  [18] 18 (04/04 0527) BP:  (185)/(92) 185/92 mmHg (04/04 0527) SpO2:  [100 %] 100 % (04/04 0527)  Labs:   There is no weight on file to calculate BMI.   Imaging Review Plain radiographs demonstrate moderate degenerative joint disease of the right hip(s). The bone quality appears to be good for age and reported activity level.  Assessment/Plan:  End stage arthritis, right hip(s)  The patient history, physical examination, clinical judgement of the provider and imaging studies are consistent with end stage degenerative joint disease of the right hip(s) and total hip arthroplasty is deemed medically necessary. The treatment options including medical management, injection therapy, arthroscopy and arthroplasty were discussed at length. The risks and benefits of total hip arthroplasty were presented and reviewed. The risks due to aseptic loosening, infection, stiffness, dislocation/subluxation,  thromboembolic complications and other imponderables were discussed.  The patient acknowledged the explanation, agreed to proceed with the plan and consent was signed. Patient is being admitted for inpatient treatment for surgery, pain control, PT, OT, prophylactic antibiotics, VTE prophylaxis, progressive ambulation and ADL's and discharge planning.The patient is planning to be discharged to skilled nursing facility

## 2012-11-29 NOTE — Op Note (Signed)
Julie Baxter, Julie Baxter NO.:  [id number]  MEDICAL RECORD NO.:  [id number]  LOCATION:  91                         FACILITY:  [facility]  PHYSICIAN:  Lind Guest. Ninfa Linden, M.D.DATE OF BIRTH:  [birth year]  DATE OF PROCEDURE:  11/29/2012 DATE OF DISCHARGE:                              OPERATIVE REPORT   PREOPERATIVE DIAGNOSIS:  Severe end-stage arthritis and degenerative joint disease, right hip.  POSTOPERATIVE DIAGNOSIS:  Severe end-stage arthritis and degenerative joint disease, right hip.  PROCEDURE:  Right total hip arthroplasty through direct anterior approach.  IMPLANTS:  DePuy Sector Gription acetabular component size 50, size 32+ 4 neutral polyethylene liner, size 11 Corail femoral component with a varus offset (KLA), size 32+ 1 metal hip ball.  SURGEON:  Lind Guest. Ninfa Linden, M.D.  ASSISTANT:  Erskine Emery, P.A.C.  ANESTHESIA:  General.  ESTIMATED BLOOD LOSS:  350-400 mL.  ANTIBIOTICS:  2 g IV Ancef.  COMPLICATIONS:  None.  INDICATIONS:  Ms. Medsker is a 77 year old female, well known to me. She has debilitating pain in her right hip and known end-stage arthritis.  She has tried all types of conservative treatment, and this has not gotten better for her.  At that point it has gotten to where her pain is daily.  Her activities are barely limited.  Her mobility is severely limited.  She wished to proceed with a total hip arthroplasty. She understands the risks of acute blood loss anemia, infection, fracture, nerve injury, DVT, and PE.  The goals or decreased pain and improved mobility and function as well as improved quality of life.  PROCEDURE DESCRIPTION:  After informed consent was obtained, appropriate right hip was marked.  She was brought to the operating room.  General anesthesia was obtained while she was on her stretcher.  Foley catheter was placed and then both feet had traction boots applied to the them. She was then placed supine  on the Hana fracture table with perineal post in place and both feet in line with skeletal traction, but no traction applied.  Her right operative hip was then prepped and draped with DuraPrep and sterile drapes.  Time-out was called.  She was identified as correct patient, correct right hip.  We then made an incision inferior and posterior to the anterior superior iliac spine, and dissected down to the tensor fascia lata.  The tensor fascia was then divided longitudinally, and we proceeded with a direct anterior approach to the hip.  A Cobra retractor was placed around the lateral neck, and up underneath the rectus femoris.  A Cobra retractor was placed medially.  We cauterized the lateral femoral circumflex vessels and then opened up the hip capsule in the L-type format.  The fusion was encounter, and we could see right away that she had severe sclerotic changes in the femoral head.  We placed a Cobra retractors within the joint capsule.  Then, I made my femoral neck cut just proximal to the lesser trochanter with the oscillating saw and completed this with an osteotome.  We placed a corkscrew guide to the femoral head and removed the femoral head in its entirety and found a large section devoid of cartilage with hard sclerotic bone, which  has been going on for years. We then placed the Bent Hohmann medially over the acetabular rim and a Cobra retractor laterally.  I cleaned the acetabulum debris and remnants of acetabular labrum.  We then began reaming from size 42 reamer in 2 mm increments up to a size 50 with all reamers placed under direct visualization and the last reamer placed under direct fluoroscopy so we could obtain better reaming, inclination, and version.  Once I was pleased with this, we placed the real DePuy Sector Gription acetabular component size 50, and again was pleased with our alignment, we knocked this into place with a mallet.  I then placed the apex hole  eliminator guide and the real polyethylene liner.  Attention was then turned to the femur.  With the leg externally rotated to 90 degrees, extended and adducted, placed the Mueller retractor medially and Hohmann retractor behind the greater trochanter, I released the lateral joint capsule.  I used a box cutting guide and a rongeur to open up the femoral canal and to lateralize side.  I then began broaching with a size 8 broach up to a size 11.  We trialed a standard and a varus neck and brought the leg back up and over it with a 32+ 1 trial hip ball.  We brought the leg into reduced position.  She was stable with internal-external rotation with minimal shuck, and her leg lengths were measured towards where she was just slightly long on the right side.  We then dislocated the hip and removed the trial components.  I lateralized a little more so we could just improve her alignment and then placed the real Corail femoral component from DePuy size 11 with varus offset and the real 32+ 1 metal hip ball.  We then brought the leg back up and over traction and internal rotation, reducing the pelvis.  Again, it was stable.  We copiously irrigated the soft tissues with normal saline solution and got a final pictures under direct fluoroscopic guidance.  We then closed the joint capsule with interrupted #1 Ethibond suture followed by a running #1 Vicryl in the tensor fascia; 0 and 2-0 Vicryl in the deep and subcutaneous tissue.  Subcuticular 4-0 Monocryl with Dermabond on the skin.  A well-padded dressing was applied.  She was taken off the Hana table, awakened, and extubated and taken to recovery room in stable condition.  All final counts correct.  There were no complications noted.  Of note, Erskine Emery, P.A.C was present in the entire case. The assistant was cruciate with the patient's positioning, exposure of the hip joint, retracting and closure of the wound.     Lind Guest. Ninfa Linden,  M.D.     CYB/MEDQ  D:  11/29/2012  T:  11/29/2012  Job:  [id number]

## 2012-11-29 NOTE — Progress Notes (Signed)
Dr. Winfred Leeds made aware of patient's blood pressures and Aldrete scores.

## 2012-11-29 NOTE — Progress Notes (Signed)
Portable AP PELVIS and Lateral Right Hip X-RAYS done.

## 2012-11-29 NOTE — Progress Notes (Signed)
X-RAY results noted 

## 2012-11-29 NOTE — Anesthesia Postprocedure Evaluation (Signed)
Anesthesia Post Note  Patient: Julie Baxter  Procedure(s) Performed: Procedure(s) (LRB): RIGHT TOTAL HIP ARTHROPLASTY ANTERIOR APPROACH (Right)  Anesthesia type: General  Patient location: PACU  Post pain: Pain level controlled  Post assessment: Post-op Vital signs reviewed  Last Vitals:  Filed Vitals:   11/29/12 1015  BP: 147/60  Pulse: 63  Temp:   Resp: 15    Post vital signs: Reviewed  Level of consciousness: sedated  Complications: No apparent anesthesia complications

## 2012-11-29 NOTE — Care Management Note (Addendum)
    Page 1 of 2   12/02/2012     12:00:45 PM   CARE MANAGEMENT NOTE 12/02/2012  Patient:  [place], [place]   Account Number:  [id number]  Date Initiated:  11/29/2012  Documentation initiated by:  Sherrin Daisy  Subjective/Objective Assessment:   dx rt total hip arthroplasty-anterior approach.    Pre-arranged with [place] for [facility] services.     Action/Plan:   Pt plans to go home to [place] where spouse and daughter will be caregivers.Daughter works for [employer]. She wants [facility] for services.   Anticipated DC Date:  12/03/2011   Anticipated DC Plan:  [place]  In-house referral  Clinical Social Worker      DC Planning Services  CM consult      [facility] Choice  HOME HEALTH   Choice offered to / List presented to:  C-1 Patient   DME arranged  3-N-1  [place]      DME agency  [place] arranged  [place].   Status of service:  Completed, signed off Medicare Important Message given?  NA - LOS <3 / Initial given by admissions (If response is "NO", the following Medicare IM given date fields will be blank) Date Medicare IM given:   Date Additional Medicare IM given:    Discharge Disposition:  [place]  Per UR Regulation:    If discussed at Long Length of Stay Meetings, dates discussed:    Comments:  12/02/2012 Sherrin Daisy BSN RN CCM [phone] Plans are for patient to discharge today.  [facility] notified that patient will be discharged today; plans are for [facility] services to start [place] 12/04/2012.   12/01/12 Elsie Ra, [place] Spoke with patient and family at bedside to discuss home health choices. Daughter in law, Gwynne Edinger reports she works for [employer] and the choice is [place] [facility] for services. Patient will need HHC orders for PT/OT and RN if needed. Spoke with Altha Harm with  [facility] to make aware of referral and will fax face sheet. Will also need order for 3 in 1, RW and shower bench. Marthenia Rolling, [place] [phone]    11/29/2012 Sherrin Daisy BSN RN CCM [phone] PT/ot pending. Pt states daughter will handle DME needs when questioned about equipment. CM will f/u.

## 2012-11-29 NOTE — Preoperative (Signed)
Beta Blockers   Reason not to administer Beta Blockers:Not Applicable, took BB this am 

## 2012-11-29 NOTE — Progress Notes (Signed)
CSW met with pt / family to assist with d/c planning. Pt plans to return home with [facility] services. RNCM will assist with d/c planning needs. CSW is available to assist if plan changes and [facility] is needed.   Werner Lean LCSW [phone]

## 2012-11-29 NOTE — Brief Op Note (Signed)
11/29/2012  9:17 AM  PATIENT:  Julie Baxter  77 y.o. female  PRE-OPERATIVE DIAGNOSIS:  Severe osteoarthritis right hip  POST-OPERATIVE DIAGNOSIS:  Severe osteoarthritis right hip  PROCEDURE:  Procedure(s): RIGHT TOTAL HIP ARTHROPLASTY ANTERIOR APPROACH (Right)  SURGEON:  Surgeon(s) and Role:    * Mcarthur Rossetti, MD - Primary  PHYSICIAN ASSISTANT:  Benita Stabile, PA-C   ANESTHESIA:   general  EBL:  Total I/O In: 1000 [I.V.:1000] Out: 600 [Urine:250; Blood:350]  BLOOD ADMINISTERED:none  DRAINS: none   LOCAL MEDICATIONS USED:  NONE  SPECIMEN:  No Specimen  DISPOSITION OF SPECIMEN:  N/A  COUNTS:  YES  TOURNIQUET:  * No tourniquets in log *  DICTATION: .Other Dictation: Dictation Number [id number]  PLAN OF CARE: Admit to inpatient   PATIENT DISPOSITION:  PACU - hemodynamically stable.   Delay start of Pharmacological VTE agent (>24hrs) due to surgical blood loss or risk of bleeding: no

## 2012-11-29 NOTE — Evaluation (Signed)
Physical Therapy Evaluation Patient Details Name: Julie Baxter MRN: [id number] DOB: 1935-11-02 Today's Date: 11/29/2012 Time: 1520-1600 PT Time Calculation (min): 40 min  PT Assessment / Plan / Recommendation Clinical Impression  Pt s/p R THR presents with decreased R LE strength/ROM  and post op pain limiting functional mobility    PT Assessment  Patient needs continued PT services    Follow Up Recommendations  Home health PT    Does the patient have the potential to tolerate intense rehabilitation      Barriers to Discharge None      Equipment Recommendations  None recommended by PT    Recommendations for Other Services OT consult   Frequency 7X/week    Precautions / Restrictions Precautions Precautions: Fall Restrictions Weight Bearing Restrictions: No Other Position/Activity Restrictions: WBAT   Pertinent Vitals/Pain 3-4/10; premed, ice pack provided      Mobility  Bed Mobility Bed Mobility: Supine to Sit Supine to Sit: 1: +2 Total assist Supine to Sit: Patient Percentage: 60% Details for Bed Mobility Assistance: cues for sequence and use of UEs and L LE to self assist.  Physical assist to manage R LE and to bring trunk to upright sitting Transfers Transfers: Sit to Stand;Stand to Sit Sit to Stand: 1: +2 Total assist Sit to Stand: Patient Percentage: 70% Stand to Sit: 1: +2 Total assist Stand to Sit: Patient Percentage: 70% Details for Transfer Assistance: cues for LE management and use of UEs to self assist Ambulation/Gait Ambulation/Gait Assistance: 1: +2 Total assist Ambulation/Gait: Patient Percentage: 70% Ambulation Distance (Feet): 28 Feet Assistive device: Rolling walker Ambulation/Gait Assistance Details: cues for posture, sequence and position from RW Gait Pattern: Step-to pattern;Decreased step length - right;Decreased step length - left    Exercises Total Joint Exercises Ankle Circles/Pumps: AROM;15 reps;Both;Supine Quad Sets:  AROM;Both;10 reps;Supine Heel Slides: AAROM;15 reps;Right;Supine Hip ABduction/ADduction: AAROM;15 reps;Right;Supine   PT Diagnosis: Difficulty walking  PT Problem List: Decreased strength;Decreased range of motion;Decreased activity tolerance;Decreased mobility;Pain;Obesity;Decreased knowledge of use of DME;Decreased safety awareness PT Treatment Interventions: DME instruction;Gait training;Stair training;Functional mobility training;Therapeutic activities;Therapeutic exercise;Patient/family education   PT Goals Acute Rehab PT Goals PT Goal Formulation: With patient Time For Goal Achievement: 12/06/12 Potential to Achieve Goals: Good Pt will go Supine/Side to Sit: with supervision PT Goal: Supine/Side to Sit - Progress: Goal set today Pt will go Sit to Supine/Side: with supervision PT Goal: Sit to Supine/Side - Progress: Goal set today Pt will go Sit to Stand: with supervision PT Goal: Sit to Stand - Progress: Goal set today Pt will go Stand to Sit: with supervision PT Goal: Stand to Sit - Progress: Goal set today Pt will Ambulate: 51 - 150 feet;with supervision;with rolling walker PT Goal: Ambulate - Progress: Goal set today Pt will Go Up / Down Stairs: 1-2 stairs;with min assist;with least restrictive assistive device PT Goal: Up/Down Stairs - Progress: Goal set today  Visit Information  Last PT Received On: 11/29/12 Assistance Needed: +2    Subjective Data  Subjective: It doesn't hurt as much as it did before surgery Patient Stated Goal: Resume previous lifestyle with decreased pain   Prior Functioning  Home Living Lives With: Spouse Available Help at Discharge: Family Type of Home: House Home Access: Stairs to enter Technical brewer of Steps: 2+1 Entrance Stairs-Rails: None Home Layout: One level Additional Comments: Pt states her DIL works for [employer] and will arrange equipment needs Prior Function Level of Independence: Independent Able to Take  Stairs?: Yes Driving: Yes Communication Communication:  No difficulties Dominant Hand: Right    Cognition  Cognition Overall Cognitive Status: Appears within functional limits for tasks assessed/performed Arousal/Alertness: Awake/alert Orientation Level: Appears intact for tasks assessed Behavior During Session: [facility] for tasks performed Cognition - Other Comments: Occasionally slow to process - ?? Meds    Extremity/Trunk Assessment Right Upper Extremity Assessment RUE ROM/Strength/Tone: WFL for tasks assessed Left Upper Extremity Assessment LUE ROM/Strength/Tone: WFL for tasks assessed Right Lower Extremity Assessment RLE ROM/Strength/Tone: Deficits RLE ROM/Strength/Tone Deficits: Hip strength 2+/5 with AAROM to 80 flex and 20 abd Left Lower Extremity Assessment LLE ROM/Strength/Tone: WFL for tasks assessed   Balance    End of Session PT - End of Session Equipment Utilized During Treatment: Gait belt Activity Tolerance: Patient tolerated treatment well Patient left: in chair;with call bell/phone within reach;with family/visitor present Nurse Communication: Mobility status  GP     Julie Baxter 11/29/2012, 4:55 PM

## 2012-11-30 LAB — BASIC METABOLIC PANEL
CO2: 28 mEq/L (ref 19–32)
Calcium: 8.9 mg/dL (ref 8.4–10.5)
Creatinine, Ser: 0.92 mg/dL (ref 0.50–1.10)
GFR calc non Af Amer: 60 mL/min — ABNORMAL LOW (ref >90)
Sodium: 138 mEq/L (ref 135–145)

## 2012-11-30 LAB — CBC
MCH: 31.3 pg (ref 26.0–34.0)
MCHC: 34.7 g/dL (ref 30.0–36.0)
MCV: 90.2 fL (ref 78.0–100.0)
Platelets: 206 10*3/uL (ref 150–400)

## 2012-11-30 NOTE — Progress Notes (Signed)
Physical Therapy Treatment Patient Details Name: Julie Baxter MRN: [id number] DOB: 1936/04/06 Today's Date: 11/30/2012 Time: 0932-1007 PT Time Calculation (min): 35 min  PT Assessment / Plan / Recommendation Comments on Treatment Session  Pt cooperative but ltd by nausea    Follow Up Recommendations  Home health PT     Does the patient have the potential to tolerate intense rehabilitation     Barriers to Discharge        Equipment Recommendations  None recommended by PT    Recommendations for Other Services OT consult  Frequency 7X/week   Plan Discharge plan remains appropriate    Precautions / Restrictions Precautions Precautions: Fall Restrictions Weight Bearing Restrictions: No Other Position/Activity Restrictions: WBAT   Pertinent Vitals/Pain Pt c/o L abdominal pain more than R hip pain but both are min at end of session with pt ore concerned about ongoing nausea    Mobility  Bed Mobility Bed Mobility: Supine to Sit Supine to Sit: 1: +2 Total assist Supine to Sit: Patient Percentage: 70% Details for Bed Mobility Assistance: cues for sequence and use of UEs and L LE to self assist.  Physical assist to manage R LE and to bring trunk to upright sitting Transfers Transfers: Sit to Stand;Stand to Sit Sit to Stand: 1: +2 Total assist Sit to Stand: Patient Percentage: 70% Stand to Sit: 1: +2 Total assist Stand to Sit: Patient Percentage: 70% Details for Transfer Assistance: cues for LE management and use of UEs to self assist Ambulation/Gait Ambulation/Gait Assistance: 1: +2 Total assist Ambulation/Gait: Patient Percentage: 70% Ambulation Distance (Feet): 31 Feet Assistive device: Rolling walker Ambulation/Gait Assistance Details: cues for sequence, posture and position from RW Gait Pattern: Step-to pattern;Decreased step length - right;Decreased step length - left General Gait Details: Ltd by ongoing nausea    Exercises Total Joint Exercises Ankle  Circles/Pumps: AROM;15 reps;Both;Supine Quad Sets: AROM;Both;10 reps;Supine Heel Slides: AAROM;15 reps;Right;Supine Hip ABduction/ADduction: AAROM;15 reps;Right;Supine   PT Diagnosis:    PT Problem List:   PT Treatment Interventions:     PT Goals Acute Rehab PT Goals PT Goal Formulation: With patient Time For Goal Achievement: 12/06/12 Potential to Achieve Goals: Good Pt will go Supine/Side to Sit: with supervision PT Goal: Supine/Side to Sit - Progress: Progressing toward goal Pt will go Sit to Supine/Side: with supervision PT Goal: Sit to Supine/Side - Progress: Progressing toward goal Pt will go Sit to Stand: with supervision PT Goal: Sit to Stand - Progress: Progressing toward goal Pt will go Stand to Sit: with supervision PT Goal: Stand to Sit - Progress: Progressing toward goal Pt will Ambulate: 51 - 150 feet;with supervision;with rolling walker PT Goal: Ambulate - Progress: Progressing toward goal Pt will Go Up / Down Stairs: 1-2 stairs;with min assist;with least restrictive assistive device  Visit Information  Last PT Received On: 11/30/12 Assistance Needed: +2    Subjective Data  Subjective: I'm nauseaous and my stomach hurts but I want to try Patient Stated Goal: Resume previous lifestyle with decreased pain   Cognition  Cognition Overall Cognitive Status: Appears within functional limits for tasks assessed/performed Arousal/Alertness: Awake/alert Orientation Level: Appears intact for tasks assessed Behavior During Session: [facility] for tasks performed    Balance     End of Session PT - End of Session Equipment Utilized During Treatment: Gait belt Activity Tolerance: Patient tolerated treatment well Patient left: in chair;with call bell/phone within reach;with family/visitor present Nurse Communication: Mobility status   GP     Graylyn Bunney 11/30/2012, 12:53 PM

## 2012-11-30 NOTE — Progress Notes (Signed)
Physical Therapy Treatment Patient Details Name: Julie Baxter MRN: [id number] DOB: 1936-05-22 Today's Date: 11/30/2012 Time: [id number] PT Time Calculation (min): 32 min  PT Assessment / Plan / Recommendation Comments on Treatment Session  Pt cooperative and motivated but demonstrating intermittant slow processing of cues and difficulty problem solving     Follow Up Recommendations  Home health PT     Does the patient have the potential to tolerate intense rehabilitation     Barriers to Discharge        Equipment Recommendations  None recommended by PT    Recommendations for Other Services OT consult  Frequency 7X/week   Plan Discharge plan remains appropriate    Precautions / Restrictions Precautions Precautions: Fall Restrictions Weight Bearing Restrictions: No Other Position/Activity Restrictions: WBAT   Pertinent Vitals/Pain     Mobility  Bed Mobility Bed Mobility: Supine to Sit;Sit to Supine Supine to Sit: 3: Mod assist;HOB elevated Sit to Supine: 1: +2 Total assist Sit to Supine: Patient Percentage: 50% Details for Bed Mobility Assistance: cues for sequence and use of UEs and L LE to self assist.  Physical assist to manage Bil LE and to bring trunk to upright sitting Transfers Transfers: Sit to Stand;Stand to Sit Sit to Stand: 4: Min assist;3: Mod assist;From elevated surface;From bed;From chair/3-in-1;With armrests;With upper extremity assist Stand to Sit: 4: Min assist;3: Mod assist;With upper extremity assist;With armrests;To bed;To chair/3-in-1 Details for Transfer Assistance: cues for LE management and use of UEs to self assist Ambulation/Gait Ambulation/Gait Assistance: 1: +2 Total assist Ambulation/Gait: Patient Percentage: 70% Ambulation Distance (Feet): 112 Feet (and 20) Assistive device: Rolling walker Ambulation/Gait Assistance Details: cues for posture, initial sequence and position from RW Gait Pattern: Step-to pattern;Step-through  pattern;Trunk flexed    Exercises     PT Diagnosis:    PT Problem List:   PT Treatment Interventions:     PT Goals Acute Rehab PT Goals PT Goal Formulation: With patient Time For Goal Achievement: 12/06/12 Potential to Achieve Goals: Good Pt will go Supine/Side to Sit: with supervision PT Goal: Supine/Side to Sit - Progress: Progressing toward goal Pt will go Sit to Supine/Side: with supervision PT Goal: Sit to Supine/Side - Progress: Progressing toward goal Pt will go Sit to Stand: with supervision PT Goal: Sit to Stand - Progress: Progressing toward goal Pt will go Stand to Sit: with supervision PT Goal: Stand to Sit - Progress: Progressing toward goal Pt will Ambulate: 51 - 150 feet;with supervision;with rolling walker PT Goal: Ambulate - Progress: Progressing toward goal Pt will Go Up / Down Stairs: 1-2 stairs;with min assist;with least restrictive assistive device  Visit Information  Last PT Received On: 11/30/12 Assistance Needed: +2    Subjective Data  Subjective: I am feeling better but you must have to have a lot of patience to work with someone like me Patient Stated Goal: Resume previous lifestyle with decreased pain   Cognition  Cognition Overall Cognitive Status: Appears within functional limits for tasks assessed/performed Arousal/Alertness: Awake/alert Orientation Level: Appears intact for tasks assessed Behavior During Session: [facility] for tasks performed Cognition - Other Comments: Occasionally slow to process - ?? Meds    Balance     End of Session PT - End of Session Equipment Utilized During Treatment: Gait belt Activity Tolerance: Patient tolerated treatment well Patient left: in bed;with call bell/phone within reach Nurse Communication: Mobility status   GP     Kaveh Kissinger 11/30/2012, 4:52 PM

## 2012-11-30 NOTE — Progress Notes (Signed)
Subjective: 1 Day Post-Op Procedure(s) (LRB): RIGHT TOTAL HIP ARTHROPLASTY ANTERIOR APPROACH (Right) Patient reports pain as moderate.  Reports left lower abdominal pain.  Some nausea.  Asymptomatic acute blood loss anemia.  Objective: Vital signs in last 24 hours: Temp:  [97.4 F (36.3 C)-98.6 F (37 C)] 98.6 F (37 C) (04/05 0515) Pulse Rate:  [58-74] 68 (04/05 0515) Resp:  [12-16] 16 (04/05 0515) BP: (93-151)/(44-77) 93/56 mmHg (04/05 0515) SpO2:  [97 %-100 %] 98 % (04/05 0515) Weight:  [94.802 kg (209 lb)] 94.802 kg (209 lb) (04/04 1114)  Intake/Output from previous day: 04/04 0701 - 04/05 0700 In: 3995 [P.O.:280; I.V.:3665; IV Piggyback:50] Out: 2775 [Urine:2425; Blood:350] Intake/Output this shift: Total I/O In: 120 [P.O.:120] Out: 150 [Urine:150]   Recent Labs  11/30/12 0430  HGB 9.9*    Recent Labs  11/30/12 0430  WBC 13.6*  RBC 3.16*  HCT 28.5*  PLT 206    Recent Labs  11/30/12 0430  NA 138  K 4.5  CL 103  CO2 28  BUN 15  CREATININE 0.92  GLUCOSE 178*  CALCIUM 8.9   No results found for this basename: LABPT, INR,  in the last 72 hours  Sensation intact distally Intact pulses distally Dorsiflexion/Plantar flexion intact Incision: dressing C/D/I Abdomin soft  Assessment/Plan: 1 Day Post-Op Procedure(s) (LRB): RIGHT TOTAL HIP ARTHROPLASTY ANTERIOR APPROACH (Right) Up with therapy  Domanique Luckett Y 11/30/2012, 9:51 AM

## 2012-12-01 LAB — CBC
HCT: 28.5 % — ABNORMAL LOW (ref 36.0–46.0)
MCH: 31.7 pg (ref 26.0–34.0)
MCV: 92.2 fL (ref 78.0–100.0)
RBC: 3.09 MIL/uL — ABNORMAL LOW (ref 3.87–5.11)
RDW: 13.6 % (ref 11.5–15.5)
WBC: 13.4 10*3/uL — ABNORMAL HIGH (ref 4.0–10.5)

## 2012-12-01 LAB — GLUCOSE, CAPILLARY
Glucose-Capillary: 160 mg/dL — ABNORMAL HIGH (ref 70–99)
Glucose-Capillary: 108 mg/dL — ABNORMAL HIGH (ref 70–99)

## 2012-12-01 NOTE — Progress Notes (Signed)
Subjective: Pt stable nausea this am   Objective: Vital signs in last 24 hours: Temp:  [98.5 F (36.9 C)-99 F (37.2 C)] 99 F (37.2 C) (04/06 0458) Pulse Rate:  [77-83] 83 (04/06 0744) Resp:  [16-20] 20 (04/06 0458) BP: (96-140)/(64-75) 140/75 mmHg (04/06 0744) SpO2:  [92 %-98 %] 92 % (04/06 0458)  Intake/Output from previous day: 04/05 0701 - 04/06 0700 In: 1065 [P.O.:120; I.V.:945] Out: 1350 [Urine:1350] Intake/Output this shift: Total I/O In: 120 [P.O.:120] Out: 600 [Urine:600]  Exam:  Neurovascular intact Sensation intact distally Intact pulses distally Dorsiflexion/Plantar flexion intact  Labs:  Recent Labs  11/30/12 0430 12/01/12 0432  HGB 9.9* 9.8*    Recent Labs  11/30/12 0430 12/01/12 0432  WBC 13.6* 13.4*  RBC 3.16* 3.09*  HCT 28.5* 28.5*  PLT 206 PLATELET CLUMPS NOTED ON SMEAR    Recent Labs  11/30/12 0430  NA 138  K 4.5  CL 103  CO2 28  BUN 15  CREATININE 0.92  GLUCOSE 178*  CALCIUM 8.9   No results found for this basename: LABPT, INR,  in the last 72 hours  Assessment/Plan: Pt doing well probable dc am   Julie Baxter 12/01/2012, 1:53 PM

## 2012-12-01 NOTE — Evaluation (Signed)
Occupational Therapy Evaluation Patient Details Name: Julie Baxter MRN: [id number] DOB: 02-Nov-1935 Today's Date: 12/01/2012 Time: 0902-0927 OT Time Calculation (min): 25 min  OT Assessment / Plan / Recommendation Clinical Impression  77 yo female s/p Rt THR direct anterior that could benefit from skilled OT acutely. Recommend HHOt for d/c planning.    OT Assessment  Patient needs continued OT Services    Follow Up Recommendations  Home health OT;Supervision - Intermittent    Barriers to Discharge      Equipment Recommendations  3 in 1 bedside comode    Recommendations for Other Services    Frequency  Min 2X/week    Precautions / Restrictions Precautions Precautions: Fall Restrictions Other Position/Activity Restrictions: WBAT   Pertinent Vitals/Pain Reports feeling flush with mobility    ADL  Eating/Feeding: Independent Where Assessed - Eating/Feeding: Chair Grooming: Wash/dry hands;Modified independent Where Assessed - Grooming: Unsupported standing Lower Body Bathing: Supervision/safety Where Assessed - Lower Body Bathing: Unsupported sit to stand Upper Body Dressing: Modified independent Where Assessed - Upper Body Dressing: Unsupported sitting Toilet Transfer: Supervision/safety Toilet Transfer Method: Sit to stand Toilet Transfer Equipment: Raised toilet seat with arms (or 3-in-1 over toilet) Toileting - Clothing Manipulation and Hygiene: Supervision/safety Where Assessed - Toileting Clothing Manipulation and Hygiene: Sit to stand from 3-in-1 or toilet Equipment Used: Gait belt;Rolling walker Transfers/Ambulation Related to ADLs: Pt completed transfer with MIN v/c for hand placement. pt with delayed response and needing min v/c for sequence with RW. Pt needing to stop several times to process the correct sequence ADL Comments: Pt needed (A) for bed mobility and demonstrates delayed processing throughout session. pt is appropriate but slow to respond. pt needed  v/c for safety with RW and hand placement. Pt positioned in chair for breakfast and Ot to reattempt second visit to address LB dressing / bathing    OT Diagnosis: Generalized weakness;Acute pain  OT Problem List: Decreased strength;Decreased activity tolerance;Impaired balance (sitting and/or standing);Decreased safety awareness;Decreased knowledge of use of DME or AE;Decreased knowledge of precautions;Pain;Obesity OT Treatment Interventions: Self-care/ADL training;DME and/or AE instruction;Therapeutic activities;Patient/family education;Balance training   OT Goals Acute Rehab OT Goals OT Goal Formulation: With patient/family Time For Goal Achievement: 12/15/12 Potential to Achieve Goals: Good ADL Goals Pt Will Perform Lower Body Bathing: with modified independence;Sit to stand from chair ADL Goal: Lower Body Bathing - Progress: Goal set today Pt Will Perform Lower Body Dressing: with modified independence;Sit to stand from chair ADL Goal: Lower Body Dressing - Progress: Goal set today Pt Will Perform Tub/Shower Transfer: with supervision;Ambulation ADL Goal: Tub/Shower Transfer - Progress: Goal set today  Visit Information  Last OT Received On: 12/01/12 Assistance Needed: +1    Subjective Data  Subjective: "I took that muscle relaxer and the pain medicine- I should have done that together"- pt verbalizing feeling flush and slower processing Patient Stated Goal: to go home and ride in grandson blazer   Prior [place] Lives With: Spouse Available Help at Discharge: Family Type of Home: House Home Access: Stairs to enter Technical brewer of Steps: 2+1 Entrance Stairs-Rails: None Home Layout: One level Biochemist, clinical: Standard Additional Comments: Pt states her DIL works for [employer] and will arrange equipment needs Prior Function Level of Independence: Independent Able to Take Stairs?: Yes Driving: Yes Communication Communication: No  difficulties Dominant Hand: Right         Vision/Perception Vision - History Baseline Vision: No visual deficits Patient Visual Report: No change from baseline  Vision - Assessment Vision Assessment: Vision not tested   Cognition  Cognition Overall Cognitive Status: Appears within functional limits for tasks assessed/performed Arousal/Alertness: Awake/alert Orientation Level: Appears intact for tasks assessed Behavior During Session: [facility] for tasks performed Cognition - Other Comments: slow processing throughout session    Extremity/Trunk Assessment Right Upper Extremity Assessment RUE ROM/Strength/Tone: WFL for tasks assessed RUE Sensation: WFL - Light Touch RUE Coordination: WFL - gross/fine motor Left Upper Extremity Assessment LUE ROM/Strength/Tone: Within functional levels LUE Sensation: WFL - Light Touch LUE Coordination: WFL - gross/fine motor     Mobility Bed Mobility Supine to Sit: 3: Mod assist Details for Bed Mobility Assistance: pt is able to boost buttock to EOB and slide bil Le off eob. pt is unable to push into sitting with elbows or to roll onto side to push into sitting. Pt needed therapist (A) to facilitate OOB. Transfers Sit to Stand: 4: Min guard;With upper extremity assist;From bed Stand to Sit: With upper extremity assist;4: Min guard;To chair/3-in-1 Details for Transfer Assistance: cues for Lt LE and hand placement     Exercise     Balance Static Sitting Balance Static Sitting - Balance Support: Bilateral upper extremity supported;Feet supported Static Sitting - Level of Assistance: 5: Stand by assistance Static Standing Balance Static Standing - Balance Support: No upper extremity supported;During functional activity Static Standing - Level of Assistance: 5: Stand by assistance (sink level task)   End of Session OT - End of Session Activity Tolerance: Patient tolerated treatment well Patient left: in chair;with call bell/phone within  reach;with family/visitor present Nurse Communication: Mobility status  GO     Veneda Melter 12/01/2012, 9:36 AM Pager: [phone]

## 2012-12-01 NOTE — Progress Notes (Signed)
Physical Therapy Treatment Patient Details Name: Julie Baxter MRN: [id number] DOB: 1935/11/04 Today's Date: 12/01/2012 Time: 1133-1207 PT Time Calculation (min): 34 min  PT Assessment / Plan / Recommendation Comments on Treatment Session  Pt cooperative and motivated but demonstrating intermittant slow processing of cues and difficulty problem solving  -? ongong nausea    Follow Up Recommendations  Home health PT     Does the patient have the potential to tolerate intense rehabilitation     Barriers to Discharge        Equipment Recommendations  None recommended by PT    Recommendations for Other Services OT consult  Frequency 7X/week   Plan Discharge plan remains appropriate    Precautions / Restrictions Precautions Precautions: Fall Restrictions Other Position/Activity Restrictions: WBAT   Pertinent Vitals/Pain     Mobility  Bed Mobility Bed Mobility: Sit to Supine Supine to Sit: 3: Mod assist Sit to Supine: 4: Min assist Details for Bed Mobility Assistance: VC for sequence and min assist for R LE onto bed Transfers Transfers: Sit to Stand;Stand to Sit Sit to Stand: 4: Min guard;With armrests;From chair/3-in-1;With upper extremity assist Stand to Sit: 4: Min guard;With armrests;To bed;To chair/3-in-1;With upper extremity assist Details for Transfer Assistance: cues for Lt LE and hand placement Ambulation/Gait Ambulation/Gait Assistance: 4: Min guard Ambulation Distance (Feet): 68 Feet (and 20 and 5) Assistive device: Rolling walker Ambulation/Gait Assistance Details: min cues for posture and position from  RW Gait Pattern: Step-to pattern;Step-through pattern;Trunk flexed General Gait Details: Ltd by ongoing nausea Stairs: Yes Stairs Assistance: 4: Min assist Stairs Assistance Details (indicate cue type and reason): cues for sequence and foot placement Stair Management Technique: Two rails;Forwards;Step to pattern Number of Stairs: 3    Exercises     PT  Diagnosis:    PT Problem List:   PT Treatment Interventions:     PT Goals Acute Rehab PT Goals PT Goal Formulation: With patient Time For Goal Achievement: 12/06/12 Potential to Achieve Goals: Good Pt will go Supine/Side to Sit: with supervision PT Goal: Supine/Side to Sit - Progress: Progressing toward goal Pt will go Sit to Supine/Side: with supervision PT Goal: Sit to Supine/Side - Progress: Progressing toward goal Pt will go Sit to Stand: with supervision PT Goal: Sit to Stand - Progress: Progressing toward goal Pt will go Stand to Sit: with supervision PT Goal: Stand to Sit - Progress: Progressing toward goal Pt will Ambulate: 51 - 150 feet;with supervision;with rolling walker PT Goal: Ambulate - Progress: Progressing toward goal Pt will Go Up / Down Stairs: 1-2 stairs;with min assist;with least restrictive assistive device PT Goal: Up/Down Stairs - Progress: Progressing toward goal  Visit Information  Last PT Received On: 12/01/12 Assistance Needed: +1    Subjective Data  Subjective: I am feeling a little qeasy (during ambulation) Patient Stated Goal: Resume previous lifestyle with decreased pain   Cognition  Cognition Overall Cognitive Status: Appears within functional limits for tasks assessed/performed Arousal/Alertness: Awake/alert Orientation Level: Appears intact for tasks assessed Behavior During Session: [facility] for tasks performed Cognition - Other Comments: processing much improved this session    Balance  Static Sitting Balance Static Sitting - Balance Support: Bilateral upper extremity supported;Feet supported Static Sitting - Level of Assistance: 5: Stand by assistance Static Standing Balance Static Standing - Balance Support: No upper extremity supported;During functional activity Static Standing - Level of Assistance: 5: Stand by assistance (sink level task)  End of Session PT - End of Session Equipment Utilized During Treatment:  Gait belt Activity  Tolerance: Patient limited by fatigue;Other (comment) (and nausea) Patient left: in bed;with family/visitor present;with call bell/phone within reach Nurse Communication: Mobility status;Other (comment) (requests meds for nausea)   GP     Dolorez Jeffrey 12/01/2012, 12:48 PM

## 2012-12-01 NOTE — Progress Notes (Signed)
Occupational Therapy Treatment Patient Details Name: Julie Baxter MRN: [id number] DOB: 08-07-1936 Today's Date: 12/01/2012 Time: [id number] OT Time Calculation (min): 15 min  OT Assessment / Plan / Recommendation Comments on Treatment Session Pt will required 3n1, HHOT and tub bench for d/c home.  Pt will need to speak with MD regarding pain management and decr flushed feeling with medications prior to d/c home.    Follow Up Recommendations  Home health OT;Supervision - Intermittent    Barriers to Discharge       Equipment Recommendations  3 in 1 bedside comode;Tub/shower bench    Recommendations for Other Services    Frequency Min 2X/week   Plan Discharge plan remains appropriate    Precautions / Restrictions Precautions Precautions: Fall Restrictions Other Position/Activity Restrictions: WBAT   Pertinent Vitals/Pain No pain at this time Improved progressing Did report vomiting this AM    ADL  Eating/Feeding: Independent Where Assessed - Eating/Feeding: Chair Grooming: Wash/dry hands;Modified independent Where Assessed - Grooming: Unsupported standing Lower Body Bathing: Supervision/safety Where Assessed - Lower Body Bathing: Unsupported sit to stand Upper Body Dressing: Modified independent Where Assessed - Upper Body Dressing: Unsupported sitting Lower Body Dressing: Modified independent Where Assessed - Lower Body Dressing: Unsupported sit to stand Toilet Transfer: Supervision/safety Toilet Transfer Method: Sit to Loss adjuster, chartered: Raised toilet seat with arms (or 3-in-1 over toilet) Toileting - Clothing Manipulation and Hygiene: Supervision/safety Where Assessed - Toileting Clothing Manipulation and Hygiene: Sit to stand from 3-in-1 or toilet Equipment Used: Gait belt;Rolling walker Transfers/Ambulation Related to ADLs: Pt completed transfer with MIN v/c for hand placement. pt with delayed response and needing min v/c for sequence with RW. Pt  needing to stop several times to process the correct sequence ADL Comments: Pt demonstrates ability to touch bil toes with bil UE at the same time unsupported sitting. Pt does not required AE for d/c home. Pt has tub with doors at home. Pt and family educated on the need for the doors to be removed and then could utilize a tub bench. Pt will practice tub bench with home therapist. Daughter in law is able to obtain all necessary DME today from [facility] if needed. Pt reports vomiting this morning s/p breakfast and poor reaction to medication. OT recommends that pt have adequate level of pain management prior to d/c home however clear for d/c from OT standpoint.    OT Diagnosis: Generalized weakness;Acute pain  OT Problem List: Decreased strength;Decreased activity tolerance;Impaired balance (sitting and/or standing);Decreased safety awareness;Decreased knowledge of use of DME or AE;Decreased knowledge of precautions;Pain;Obesity OT Treatment Interventions: Self-care/ADL training;DME and/or AE instruction;Therapeutic activities;Patient/family education;Balance training   OT Goals Acute Rehab OT Goals OT Goal Formulation: With patient/family Time For Goal Achievement: 12/15/12 Potential to Achieve Goals: Good ADL Goals Pt Will Perform Lower Body Bathing: with modified independence;Sit to stand from chair ADL Goal: Lower Body Bathing - Progress: Met Pt Will Perform Lower Body Dressing: with modified independence;Sit to stand from chair ADL Goal: Lower Body Dressing - Progress: Met Pt Will Perform Tub/Shower Transfer: with supervision;Ambulation ADL Goal: Tub/Shower Transfer - Progress: Goal set today  Visit Information  Last OT Received On: 12/01/12 Assistance Needed: +1    Subjective Data  Subjective: "I took that muscle relaxer and the pain medicine- I should have done that together"- pt verbalizing feeling flush and slower processing Patient Stated Goal: to go home and ride in  grandson blazer   Prior [place] Lives With: Spouse Available  Help at Discharge: Family Type of Home: House Home Access: Stairs to enter [employer] of Steps: 2+1 Entrance Stairs-Rails: None Home Layout: One level Bathroom Shower/Tub: Tub/shower unit;Door [employer]: Standard Additional Comments: Pt states her DIL works for [employer] and will arrange equipment needs Prior Function Level of Independence: Independent Able to Take Stairs?: Yes Driving: Yes Communication Communication: No difficulties Dominant Hand: Right    Cognition  Cognition Overall Cognitive Status: Appears within functional limits for tasks assessed/performed Arousal/Alertness: Awake/alert Orientation Level: Appears intact for tasks assessed Behavior During Session: [facility] for tasks performed Cognition - Other Comments: processing much improved this session    Mobility  Bed Mobility Supine to Sit: 3: Mod assist Details for Bed Mobility Assistance: pt is able to boost buttock to EOB and slide bil Le off eob. pt is unable to push into sitting with elbows or to roll onto side to push into sitting. Pt needed therapist (A) to facilitate OOB. Transfers Sit to Stand: 4: Min guard;With upper extremity assist;From bed Stand to Sit: With upper extremity assist;4: Min guard;To chair/3-in-1 Details for Transfer Assistance: cues for Lt LE and hand placement    Exercises      Balance Static Sitting Balance Static Sitting - Balance Support: Bilateral upper extremity supported;Feet supported Static Sitting - Level of Assistance: 5: Stand by assistance Static Standing Balance Static Standing - Balance Support: No upper extremity supported;During functional activity Static Standing - Level of Assistance: 5: Stand by assistance (sink level task)   End of Session OT - End of Session Activity Tolerance: Patient tolerated treatment well Patient left: in chair;with call bell/phone  within reach;with family/visitor present Nurse Communication: Mobility status  GO     Veneda Melter 12/01/2012, 11:39 AM  Pager: [phone]

## 2012-12-01 NOTE — Progress Notes (Signed)
PT NOTE - PM session deferred - per nursing pt with BP 78/46.  Will follow in am

## 2012-12-01 NOTE — Care Management Note (Addendum)
Spoke with patient and family at bedside to discuss home health choices. Daughter in law, Gwynne Edinger reports she works for [employer] and the choice is [facility] for services. Patient will need HHC orders for PT/OT and RN if needed. Spoke with Altha Harm with [facility] to make aware of referral and will fax face sheet. Will also need order for 3 in 1, RW and shower bench. [place], [place]

## 2012-12-02 ENCOUNTER — Encounter (HOSPITAL_COMMUNITY): Payer: Self-pay | Admitting: Orthopaedic Surgery

## 2012-12-02 LAB — PREPARE RBC (CROSSMATCH)

## 2012-12-02 LAB — CBC
HCT: 23.4 % — ABNORMAL LOW (ref 36.0–46.0)
MCH: 31.4 pg (ref 26.0–34.0)
MCHC: 34.2 g/dL (ref 30.0–36.0)
MCV: 91.8 fL (ref 78.0–100.0)
RDW: 13.3 % (ref 11.5–15.5)
WBC: 7.9 10*3/uL (ref 4.0–10.5)

## 2012-12-02 LAB — GLUCOSE, CAPILLARY
Glucose-Capillary: 57 mg/dL — ABNORMAL LOW (ref 70–99)
Glucose-Capillary: 115 mg/dL — ABNORMAL HIGH (ref 70–99)

## 2012-12-02 MED ORDER — ASPIRIN EC 325 MG PO TBEC: 325.0000 mg | DELAYED_RELEASE_TABLET | Freq: Two times a day (BID) | ORAL | Status: DC

## 2012-12-02 MED ORDER — FERROUS SULFATE 325 (65 FE) MG PO TABS: 325.0000 mg | ORAL_TABLET | Freq: Three times a day (TID) | ORAL | Status: DC

## 2012-12-02 MED ORDER — OXYCODONE-ACETAMINOPHEN 5-325 MG PO TABS: 1.0000 | ORAL_TABLET | ORAL | Status: DC | PRN

## 2012-12-02 MED ORDER — ACETAMINOPHEN-CODEINE #3 300-30 MG PO TABS: 1.0000 | ORAL_TABLET | ORAL | Status: DC | PRN

## 2012-12-02 MED ORDER — METHOCARBAMOL 500 MG PO TABS: 500.0000 mg | ORAL_TABLET | Freq: Four times a day (QID) | ORAL | Status: DC | PRN

## 2012-12-02 NOTE — Progress Notes (Signed)
Physical Therapy Treatment Patient Details Name: Julie Baxter MRN: [id number] DOB: December 07, 1935 Today's Date: 12/02/2012 Time: 1346-1400 PT Time Calculation (min): 14 min  PT Assessment / Plan / Recommendation Comments on Treatment Session  POD # 3 R THR Dir Anter planning to D/C to home today.  Practiced steps and given HEP.    Follow Up Recommendations  Home health PT     Does the patient have the potential to tolerate intense rehabilitation     Barriers to Discharge        Equipment Recommendations  None recommended by PT    Recommendations for Other Services    Frequency 7X/week   Plan Discharge plan remains appropriate    Precautions / Restrictions  Direct Anterior THR   Pertinent Vitals/Pain No c/o pain    Mobility  Bed Mobility Bed Mobility: Not assessed Details for Bed Mobility Assistance: Pt EOB on arrival Transfers Transfers: Sit to Stand;Stand to Sit Sit to Stand: 5: Supervision Stand to Sit: 5: Supervision Details for Transfer Assistance: increased time and 25% VC's on proper hand placement and safety Ambulation/Gait Ambulation/Gait Assistance: 5: Supervision Ambulation Distance (Feet): 2 Feet Assistive device: Rolling walker Ambulation/Gait Assistance Details: increased time and 25% VC's on safety with turns and backward gait Gait Pattern: Step-to pattern;Step-through pattern;Trunk flexed Gait velocity: decreased Stairs: Yes Stairs Assistance: 4: Min assist Stairs Assistance Details (indicate cue type and reason): with family and 25% VC's on proper tech Number of Stairs: 2    PT Goals                                            progressing     Visit Information  Last PT Received On: 12/02/12 Assistance Needed: +1    Subjective Data  Subjective: You get me home and I will find a way to get into the house Patient Stated Goal: home   Cognition       Balance   fair+  End of Session PT - End of Session Equipment Utilized During Treatment:  Gait belt  Rica Koyanagi  PTA [facility]  Acute  Rehab Pager      [phone]

## 2012-12-02 NOTE — Progress Notes (Signed)
CBG: 57  Treatment: 15 GM carbohydrate snack  Symptoms: None  Follow-up CBG: Time: 0850 CBG Result: 85  Possible Reasons for Event: Inadequate meal intake

## 2012-12-02 NOTE — Progress Notes (Signed)
Subjective: 3 Days Post-Op Procedure(s) (LRB): RIGHT TOTAL HIP ARTHROPLASTY ANTERIOR APPROACH (Right) Patient reports pain as mild.  Somewhat symptomatic acute blood loss anemia.  Objective: Vital signs in last 24 hours: Temp:  [97.8 F (36.6 C)-99.2 F (37.3 C)] 98.5 F (36.9 C) (04/07 0450) Pulse Rate:  [76-83] 83 (04/07 0450) Resp:  [16-20] 20 (04/07 0450) BP: (78-140)/(46-75) 103/62 mmHg (04/07 0450) SpO2:  [91 %-95 %] 94 % (04/07 0450)  Intake/Output from previous day: 04/06 0701 - 04/07 0700 In: 400 [P.O.:360; I.V.:40] Out: 1775 [Urine:1775] Intake/Output this shift:     Recent Labs  11/30/12 0430 12/01/12 0432 12/02/12 0428  HGB 9.9* 9.8* 8.0*    Recent Labs  12/01/12 0432 12/02/12 0428  WBC 13.4* 7.9  RBC 3.09* 2.55*  HCT 28.5* 23.4*  PLT PLATELET CLUMPS NOTED ON SMEAR 182    Recent Labs  11/30/12 0430  NA 138  K 4.5  CL 103  CO2 28  BUN 15  CREATININE 0.92  GLUCOSE 178*  CALCIUM 8.9   No results found for this basename: LABPT, INR,  in the last 72 hours  Sensation intact distally Intact pulses distally Dorsiflexion/Plantar flexion intact Incision: dressing C/D/I  Assessment/Plan: 3 Days Post-Op Procedure(s) (LRB): RIGHT TOTAL HIP ARTHROPLASTY ANTERIOR APPROACH (Right) Discharge home with home health Transfuse one unit PRBC's today  Rosella Crandell Y 12/02/2012, 7:23 AM

## 2012-12-02 NOTE — Progress Notes (Signed)
OT Cancellation Note  Patient Details Name: KIRBI SIRMON MRN: [id number] DOB: [birth year]   Cancelled Treatment:    Reason Eval/Treat Not Completed: Other (comment) (pt with no further concerns. defer tub transfer to [facility]) Pt has adequate assist for ADL by husband and goals met for LB ADL. Pt states she will sponge bathe initially so defer tub transfer to [facility].  Jules Schick [id number] 12/02/2012, 12:19 PM

## 2012-12-02 NOTE — Discharge Summary (Signed)
Patient ID: Julie Baxter MRN: [id number] DOB/AGE: Jul 10, 1936 77 y.o.  Admit date: 11/29/2012 Discharge date: 12/02/2012  Admission Diagnoses:  Principal Problem:   Degenerative arthritis of hip   Discharge Diagnoses:  Same  Past Medical History  Diagnosis Date  . Hypertension   . Anxiety     regarding surgery  . Diabetes mellitus without complication   . Peripheral vascular disease     varicose veins-  "Blood Clot anterior RIGHT LEG WITH PREGNANCY"  . Arthritis     Surgeries: Procedure(s): RIGHT TOTAL HIP ARTHROPLASTY ANTERIOR APPROACH on 11/29/2012   Consultants:    Discharged Condition: Improved  Hospital Course: Julie Baxter is an 77 y.o. female who was admitted 11/29/2012 for operative treatment ofDegenerative arthritis of hip. Patient has severe unremitting pain that affects sleep, daily activities, and work/hobbies. After pre-op clearance the patient was taken to the operating room on 11/29/2012 and underwent  Procedure(s):   RIGHT TOTAL HIP ARTHROPLASTY ANTERIOR APPROACH.    Patient was given perioperative antibiotics: Anti-infectives   Start     Dose/Rate Route Frequency Ordered Stop   11/29/12 1400  ceFAZolin (ANCEF) IVPB 1 g/50 mL premix     1 g 100 mL/hr over 30 Minutes Intravenous Every 6 hours 11/29/12 1125 11/29/12 2030   11/29/12 0552  ceFAZolin (ANCEF) IVPB 2 g/50 mL premix     2 g 100 mL/hr over 30 Minutes Intravenous On call to O.R. 11/29/12 [id number] 11/29/12 [id number]       Patient was given sequential compression devices, early ambulation, and chemoprophylaxis to prevent DVT.  Patient benefited maximally from hospital stay and there were no complications.  She did receive one unit of blood due to symptomatic acute blood loss anemia prior to discharge.  Recent vital signs: Patient Vitals for the past 24 hrs:  BP Temp Temp src Pulse Resp SpO2  12/02/12 0450 103/62 mmHg 98.5 F (36.9 C) Oral 83 20 94 %  12/01/12 2200 99/61 mmHg 97.8 F (36.6 C) Oral 77  20 93 %  12/01/12 1600 - - - - 16 94 %  12/01/12 1459 106/50 mmHg - - 76 - 95 %  12/01/12 1400 78/46 mmHg 99.2 F (37.3 C) Oral 77 17 93 %  12/01/12 1200 - - - - 20 91 %  12/01/12 0800 - - - - 16 94 %  12/01/12 0744 140/75 mmHg - - 83 - -     Recent laboratory studies:  Recent Labs  11/30/12 0430 12/01/12 0432 12/02/12 0428  WBC 13.6* 13.4* 7.9  HGB 9.9* 9.8* 8.0*  HCT 28.5* 28.5* 23.4*  PLT 206 PLATELET CLUMPS NOTED ON SMEAR 182  NA 138  --   --   K 4.5  --   --   CL 103  --   --   CO2 28  --   --   BUN 15  --   --   CREATININE 0.92  --   --   GLUCOSE 178*  --   --   CALCIUM 8.9  --   --      Discharge Medications:     Medication List    TAKE these medications       acetaminophen-codeine 300-30 MG per tablet  Commonly known as:  TYLENOL #3  Take 1-2 tablets by mouth every 4 (four) hours as needed for pain.     aspirin EC 325 MG tablet  Take 1 tablet (325 mg total) by mouth 2 (two) times daily.  atenolol 50 MG tablet  Commonly known as:  TENORMIN  Take 75 mg by mouth 2 (two) times daily.     ferrous sulfate 325 (65 FE) MG tablet  Take 1 tablet (325 mg total) by mouth 3 (three) times daily after meals.     furosemide 20 MG tablet  Commonly known as:  LASIX  Take 20 mg by mouth every morning.     glipiZIDE 5 MG tablet  Commonly known as:  GLUCOTROL  Take 5 mg by mouth 2 (two) times daily before a meal.     losartan 25 MG tablet  Commonly known as:  COZAAR  Take 25 mg by mouth 2 (two) times daily.     metFORMIN 500 MG tablet  Commonly known as:  GLUCOPHAGE  Take 1,000 mg by mouth 2 (two) times daily with a meal.     methylcellulose 1 % ophthalmic solution  Commonly known as:  ARTIFICIAL TEARS  Place 1 drop into both eyes 2 (two) times daily as needed (dry eyes).     OVER THE COUNTER MEDICATION  Take 3 tablets by mouth daily as needed. Restful legs     oxyCODONE-acetaminophen 5-325 MG per tablet  Commonly known as:  ROXICET  Take 1-2 tablets  by mouth every 4 (four) hours as needed for pain.     rosuvastatin 10 MG tablet  Commonly known as:  CRESTOR  Take 10 mg by mouth every evening.        Diagnostic Studies: Dg Chest 2 View  11/18/2012  *RADIOLOGY REPORT*  Clinical Data: Preoperative evaluation prior to total hip replacement.  CHEST - 2 VIEW  Comparison: No priors.  Findings: Lung volumes are normal.  No consolidative airspace disease.  No pleural effusions.  No pneumothorax.  No pulmonary nodule or mass noted.  Pulmonary vasculature and the cardiomediastinal silhouette are within normal limits. Atherosclerosis of the thoracic aorta.  IMPRESSION: 1. No radiographic evidence of acute cardiopulmonary disease. 2.  Atherosclerosis.   Original Report Authenticated By: Vinnie Langton, M.D.    Dg Hip Complete Right  11/29/2012  *RADIOLOGY REPORT*  Clinical Data: Post hip replacement procedure.  RIGHT HIP - COMPLETE 2+ VIEW  Comparison: 09/16/2012.  Findings: Right hip arthroplasty has been performed. There is expected relationship between the femoral and acetabular hardware components with no dislocation or disruption of hardware evident.  IMPRESSION: Right hip arthroplasty has been performed. There is expected relationship between the femoral and acetabular hardware components with no dislocation or disruption of hardware evident.   Original Report Authenticated By: Shanon Brow Call    Dg Pelvis Portable  11/29/2012  *RADIOLOGY REPORT*  Clinical Data: Right hip replacement  PORTABLE PELVIS  Comparison: Plain film 09/16/2012  Findings: Interval right hip total arthroplasty.  The femoral component and acetabular component appear well positioned.  No evidence of fracture.  IMPRESSION: No complication following right hip total arthroplasty.   Original Report Authenticated By: Suzy Bouchard, M.D.    Dg Hip Portable 1 View Right  11/29/2012  *RADIOLOGY REPORT*  Clinical Data: Postop right hip replacement  PORTABLE RIGHT HIP - 1 VIEW  Comparison:  Plain film 09/16/2012  Findings: Interval total right hip arthroplasty.  No evidence of fracture or dislocation  IMPRESSION: No complication following right hip arthroplasty.   Original Report Authenticated By: Suzy Bouchard, M.D.    Dg C-arm 1-60 Min-no Report  11/29/2012  CLINICAL DATA: anterior hip   C-ARM 1-60 MINUTES  Fluoroscopy was utilized by the requesting physician.  No radiographic  interpretation.      Disposition:  To home      Discharge Orders   Future Orders Complete By Expires     Call MD / Call 911  As directed     Comments:      If you experience chest pain or shortness of breath, CALL 911 and be transported to the hospital emergency room.  If you develope a fever above 101 F, pus (white drainage) or increased drainage or redness at the wound, or calf pain, call your surgeon's office.    Constipation Prevention  As directed     Comments:      Drink plenty of fluids.  Prune juice may be helpful.  You may use a stool softener, such as Colace (over the counter) 100 mg twice a day.  Use MiraLax (over the counter) for constipation as needed.    Diet - low sodium heart healthy  As directed     Discharge patient  As directed     Discharge wound care:  As directed     Comments:      Keep dressing clean dry and intact until Wednesday then remove and shower    Increase activity slowly as tolerated  As directed     Weight bearing as tolerated  As directed        Follow-up Information   Follow up with [facility]. (home health services and DME/ equipment)    Contact information:   [phone]      Follow up with Mcarthur Rossetti, MD. Schedule an appointment as soon as possible for a visit in 2 weeks.   Contact information:   [place] [address] [phone]        Signed: Mcarthur Rossetti 12/02/2012, 7:27 AM

## 2012-12-02 NOTE — Progress Notes (Signed)
Pt to d/c home with [facility]. Pt has all recommended DME. AVS reviewed and "My Chart" discussed with pt. Pt capable of verbalizing medications and follow-up appointments. Remains hemodynamically stable. No signs and symptoms of distress. Educated pt to return to ER in the case of SOB, dizziness, or chest pain.

## 2012-12-03 LAB — TYPE AND SCREEN: ABO/RH(D): A NEG

## 2012-12-04 ENCOUNTER — Ambulatory Visit: Payer: Self-pay | Admitting: Internal Medicine

## 2012-12-04 ENCOUNTER — Encounter: Payer: Self-pay | Admitting: Geriatric Medicine

## 2013-02-23 ENCOUNTER — Other Ambulatory Visit: Payer: Self-pay | Admitting: Internal Medicine

## 2013-02-24 ENCOUNTER — Other Ambulatory Visit: Payer: Self-pay | Admitting: *Deleted

## 2013-02-24 MED ORDER — GLIPIZIDE 5 MG PO TABS: 5.0000 mg | ORAL_TABLET | Freq: Two times a day (BID) | ORAL | Status: DC

## 2013-02-24 MED ORDER — ROSUVASTATIN CALCIUM 10 MG PO TABS: 10.0000 mg | ORAL_TABLET | Freq: Every evening | ORAL | Status: DC

## 2013-02-25 ENCOUNTER — Other Ambulatory Visit: Payer: Self-pay | Admitting: *Deleted

## 2013-03-10 ENCOUNTER — Ambulatory Visit: Payer: Self-pay | Admitting: Internal Medicine

## 2013-03-28 ENCOUNTER — Other Ambulatory Visit: Payer: Self-pay | Admitting: Geriatric Medicine

## 2013-03-28 MED ORDER — ATENOLOL 50 MG PO TABS: 75.0000 mg | ORAL_TABLET | Freq: Two times a day (BID) | ORAL | Status: DC

## 2013-04-14 ENCOUNTER — Encounter: Payer: Self-pay | Admitting: Internal Medicine

## 2013-04-14 ENCOUNTER — Ambulatory Visit (INDEPENDENT_AMBULATORY_CARE_PROVIDER_SITE_OTHER): Payer: Medicare Other | Admitting: Internal Medicine

## 2013-04-14 VITALS — BP 136/88 | HR 61 | Temp 97.9°F | Resp 14 | Ht 64.0 in | Wt 203.6 lb

## 2013-04-14 DIAGNOSIS — E782 Mixed hyperlipidemia: Secondary | ICD-10-CM | POA: Insufficient documentation

## 2013-04-14 DIAGNOSIS — E119 Type 2 diabetes mellitus without complications: Secondary | ICD-10-CM

## 2013-04-14 DIAGNOSIS — E785 Hyperlipidemia, unspecified: Secondary | ICD-10-CM

## 2013-04-14 DIAGNOSIS — E11319 Type 2 diabetes mellitus with unspecified diabetic retinopathy without macular edema: Secondary | ICD-10-CM

## 2013-04-14 DIAGNOSIS — M1611 Unilateral primary osteoarthritis, right hip: Secondary | ICD-10-CM

## 2013-04-14 DIAGNOSIS — M1610 Unilateral primary osteoarthritis, unspecified hip: Secondary | ICD-10-CM

## 2013-04-14 DIAGNOSIS — M169 Osteoarthritis of hip, unspecified: Secondary | ICD-10-CM

## 2013-04-14 DIAGNOSIS — E1139 Type 2 diabetes mellitus with other diabetic ophthalmic complication: Secondary | ICD-10-CM

## 2013-04-14 DIAGNOSIS — I10 Essential (primary) hypertension: Secondary | ICD-10-CM

## 2013-04-14 NOTE — Progress Notes (Signed)
Patient ID: Julie Baxter, female   DOB: [birth year], 77 y.o.   MRN: [id number] Location:  [facility]  Code Status: does not yet have living will or HCPOA at this time--discussed completing this  Allergies  Allergen Reactions  . Naproxen Swelling and Other (See Comments)    Blacked out   . Tramadol Nausea And Vomiting    Chief Complaint  Patient presents with  . Medical Managment of Chronic Issues    HPI: Patient is a 77 y.o. white female seen in the office today for f/u of chronic conditions including DMII, HTN, hyperlipidemia and osteoarthritis of the knees and hips.  Doing well.  Only c/o dryness of nose and cheeks beneath eyes--using a salicylic acid product that she will stop.  Having difficulty with right knee, but following up with ortho due to bone spur behind knee.  BP great at home.  Ok here, too.  Had right hip replaced by Dr. Ninfa Linden.  No visual changes.  Sees eye doctor in [place].  Has some sensation loss in her right leg also.  Review of Systems:  Review of Systems  Constitutional: Negative for fever and chills.  HENT: Negative for congestion.   Eyes: Negative for blurred vision.  Respiratory: Negative for shortness of breath.   Cardiovascular: Negative for chest pain.  Gastrointestinal: Negative for constipation.  Genitourinary: Negative for dysuria, urgency and frequency.  Musculoskeletal: Positive for joint pain.  Neurological: Positive for tingling and sensory change.  Psychiatric/Behavioral: Negative for depression and memory loss. The patient is not nervous/anxious.      Past Medical History  Diagnosis Date  . Hypertension   . Anxiety     regarding surgery  . Diabetes mellitus without complication   . Peripheral vascular disease     varicose veins-  "Blood Clot anterior RIGHT LEG WITH PREGNANCY"  . Arthritis   . Pain in joint, pelvic region and thigh   . Hyperpotassemia   . Osteoarthrosis, unspecified whether  generalized or localized, unspecified site   . Overweight(278.02)   . Diabetic retinopathy   . Chronic kidney disease   . Asymptomatic varicose veins   . Edema   . Type II or unspecified type diabetes mellitus with renal manifestations, not stated as uncontrolled(250.40)   . Unspecified disorder of kidney and ureter   . Unspecified disorder of kidney and ureter   . Cough   . Pain in joint, lower leg   . Other specified disease of nail   . Urinary tract infection, site not specified   . Type II or unspecified type diabetes mellitus without mention of complication, uncontrolled   . Other and unspecified hyperlipidemia   . Other abnormal blood chemistry   . Other malaise and fatigue   . Insomnia, unspecified   . Type II or unspecified type diabetes mellitus without mention of complication, not stated as uncontrolled   . Anxiety state, unspecified   . Undiagnosed cardiac murmurs     Past Surgical History  Procedure Laterality Date  . Abdominal hysterectomy    . Eye surgery Bilateral     cataract extraction with IOL  . Spinal meningitis  1957  . Tonsillectomy    . Lipoma excision      x2  bilateral buttocks  . Total hip arthroplasty Right 11/29/2012    Procedure: RIGHT TOTAL HIP ARTHROPLASTY ANTERIOR APPROACH;  Surgeon: Mcarthur Rossetti, MD;  Location: [facility];  Service: Orthopedics;  Laterality: Right;  Social History:   reports that she has never smoked. She has never used smokeless tobacco. She reports that she does not drink alcohol or use illicit drugs.  No family history on file.  Medications: Patient's Medications  New Prescriptions   No medications on file  Previous Medications   ASPIRIN 81 MG TABLET    Take one tablet twice daily   ATENOLOL (TENORMIN) 50 MG TABLET    Take 1.5 tablets (75 mg total) by mouth 2 (two) times daily.   FUROSEMIDE (LASIX) 20 MG TABLET    Take 20 mg by mouth every morning. Take 1 tablet by mouth daily for blood pressure and edema.    GLIPIZIDE (GLUCOTROL) 5 MG TABLET    Take 1 tablet (5 mg total) by mouth 2 (two) times daily before a meal.   LOSARTAN (COZAAR) 25 MG TABLET    Take 1 tablet by mouth  every morning and 1 tablet  in the evening   METFORMIN (GLUCOPHAGE) 500 MG TABLET    Take 1,000 mg by mouth 2 (two) times daily with a meal.   METHYLCELLULOSE (ARTIFICIAL TEARS) 1 % OPHTHALMIC SOLUTION    Place 1 drop into both eyes 2 (two) times daily as needed (dry eyes).   OVER THE COUNTER MEDICATION    Take 3 tablets by mouth daily as needed. Restful legs   ROSUVASTATIN (CRESTOR) 10 MG TABLET    Take 1 tablet (10 mg total) by mouth every evening.  Modified Medications   No medications on file  Discontinued Medications   ACETAMINOPHEN-CODEINE (TYLENOL #3) 300-30 MG PER TABLET    Take 1-2 tablets by mouth every 4 (four) hours as needed for pain.   ASPIRIN EC 325 MG TABLET    Take 1 tablet (325 mg total) by mouth 2 (two) times daily.   FERROUS SULFATE 325 (65 FE) MG TABLET    Take 1 tablet (325 mg total) by mouth 3 (three) times daily after meals.   OXYCODONE-ACETAMINOPHEN (ROXICET) 5-325 MG PER TABLET    Take 1-2 tablets by mouth every 4 (four) hours as needed for pain.     Physical Exam: Filed Vitals:   04/14/13 1330  BP: 136/88  Pulse: 61  Temp: 97.9 F (36.6 C)  TempSrc: Oral  Resp: 14  Height: 5\' 4"  (1.626 m)  Weight: 203 lb 9.6 oz (92.352 kg)  Physical Exam  Constitutional: She is oriented to person, place, and time. She appears well-developed and well-nourished. No distress.  Obese white female  HENT:  Head: Normocephalic and atraumatic.  Eyes: Pupils are equal, round, and reactive to light.  Cardiovascular: Normal rate, regular rhythm, normal heart sounds and intact distal pulses.   Pulmonary/Chest: Effort normal and breath sounds normal.  Abdominal: Soft. Bowel sounds are normal. She exhibits no distension.  Musculoskeletal: She exhibits no edema.  Neurological: She is alert and oriented to person, place,  and time. No cranial nerve deficit.  Skin: Skin is warm and dry.  Dry skin over nose and upper cheeks beneath eyes   Labs reviewed: Basic Metabolic Panel:  Recent Labs  11/18/12 0955 11/30/12 0430  NA 141 138  K 4.2 4.5  CL 103 103  CO2 27 28  GLUCOSE 129* 178*  BUN 22 15  CREATININE 1.00 0.92  CALCIUM 9.2 8.9   CBC:  Recent Labs  11/30/12 0430 12/01/12 0432 12/02/12 0428  WBC 13.6* 13.4* 7.9  HGB 9.9* 9.8* 8.0*  HCT 28.5* 28.5* 23.4*  MCV 90.2 92.2 91.8  PLT 206 PLATELET CLUMPS NOTED ON SMEAR 182   Assessment/Plan 1. Hypertension --readings at goal at home, sometimes elevated here with anxiety  2. Diabetes mellitus with ophthalmologic complications --follows with ophtho --check hba1c --stable  3. Diabetic retinopathy --follow up with ophtho  4.  Hyperlipidemia goal <100 --f/u flp when fasting this week  5.  Hip osteoarthritis:  S/p right hip replacement (anterior approach) by Dr. Rush Farmer --has recovered wonderfully--still has some mild numbness of right thigh and has bone spur behind knee  Refused bone density, cscope Had pneumococcal vaccine w/in past 5 years at [facility] Had zostavax last year at [facility] Had tetanus vaccine also  Labs/tests ordered:  FLP, hba1c, cbc, cmp fasting will come thursday Next appt:  6 mos

## 2013-04-17 ENCOUNTER — Other Ambulatory Visit: Payer: Medicare Other

## 2013-04-17 DIAGNOSIS — E119 Type 2 diabetes mellitus without complications: Secondary | ICD-10-CM

## 2013-04-17 DIAGNOSIS — E785 Hyperlipidemia, unspecified: Secondary | ICD-10-CM

## 2013-04-17 DIAGNOSIS — I10 Essential (primary) hypertension: Secondary | ICD-10-CM

## 2013-04-18 ENCOUNTER — Other Ambulatory Visit: Payer: Self-pay | Admitting: Geriatric Medicine

## 2013-04-18 LAB — CBC WITH DIFFERENTIAL/PLATELET
Basophils Absolute: 0 10*3/uL (ref 0.0–0.2)
Basos: 0 % (ref 0–3)
Eos: 3 % (ref 0–5)
Eosinophils Absolute: 0.3 10*3/uL (ref 0.0–0.4)
HCT: 37.1 % (ref 34.0–46.6)
Hemoglobin: 12.6 g/dL (ref 11.1–15.9)
Immature Grans (Abs): 0 10*3/uL (ref 0.0–0.1)
Immature Granulocytes: 0 % (ref 0–2)
Lymphocytes Absolute: 3.3 10*3/uL — ABNORMAL HIGH (ref 0.7–3.1)
Lymphs: 36 % (ref 14–46)
MCH: 31 pg (ref 26.6–33.0)
MCHC: 34 g/dL (ref 31.5–35.7)
MCV: 91 fL (ref 79–97)
Monocytes Absolute: 0.9 10*3/uL (ref 0.1–0.9)
Monocytes: 10 % (ref 4–12)
Neutrophils Absolute: 4.6 10*3/uL (ref 1.4–7.0)
Neutrophils Relative %: 51 % (ref 40–74)
RBC: 4.07 x10E6/uL (ref 3.77–5.28)
RDW: 13.3 % (ref 12.3–15.4)
WBC: 9.2 10*3/uL (ref 3.4–10.8)

## 2013-04-18 LAB — COMPREHENSIVE METABOLIC PANEL
ALT: 11 IU/L (ref 0–32)
AST: 15 IU/L (ref 0–40)
Albumin/Globulin Ratio: 1.9 (ref 1.1–2.5)
Albumin: 4.2 g/dL (ref 3.5–4.8)
Alkaline Phosphatase: 65 IU/L (ref 39–117)
BUN/Creatinine Ratio: 19 (ref 11–26)
BUN: 19 mg/dL (ref 8–27)
CO2: 25 mmol/L (ref 18–29)
Calcium: 9.7 mg/dL (ref 8.6–10.2)
Chloride: 101 mmol/L (ref 97–108)
Creatinine, Ser: 1.02 mg/dL — ABNORMAL HIGH (ref 0.57–1.00)
GFR calc Af Amer: 62 mL/min/{1.73_m2} (ref >59)
GFR calc non Af Amer: 54 mL/min/{1.73_m2} — ABNORMAL LOW (ref >59)
Globulin, Total: 2.2 g/dL (ref 1.5–4.5)
Glucose: 129 mg/dL — ABNORMAL HIGH (ref 65–99)
Potassium: 4.8 mmol/L (ref 3.5–5.2)
Sodium: 142 mmol/L (ref 134–144)
Total Bilirubin: 0.4 mg/dL (ref 0.0–1.2)
Total Protein: 6.4 g/dL (ref 6.0–8.5)

## 2013-04-18 LAB — LIPID PANEL
Chol/HDL Ratio: 2.6 ratio units (ref 0.0–4.4)
Cholesterol, Total: 120 mg/dL (ref 100–199)
HDL: 47 mg/dL (ref >39)
LDL Calculated: 46 mg/dL (ref 0–99)
Triglycerides: 133 mg/dL (ref 0–149)
VLDL Cholesterol Cal: 27 mg/dL (ref 5–40)

## 2013-04-18 LAB — HEMOGLOBIN A1C
Est. average glucose Bld gHb Est-mCnc: 166 mg/dL
Hgb A1c MFr Bld: 7.4 % — ABNORMAL HIGH (ref 4.8–5.6)

## 2013-04-18 MED ORDER — LINAGLIPTIN 5 MG PO TABS: 5.0000 mg | ORAL_TABLET | Freq: Every day | ORAL | Status: DC

## 2013-07-28 ENCOUNTER — Other Ambulatory Visit: Payer: Self-pay | Admitting: Internal Medicine

## 2013-08-02 ENCOUNTER — Other Ambulatory Visit: Payer: Self-pay | Admitting: Internal Medicine

## 2013-10-15 ENCOUNTER — Other Ambulatory Visit: Payer: Self-pay | Admitting: *Deleted

## 2013-10-15 ENCOUNTER — Telehealth: Payer: Self-pay | Admitting: *Deleted

## 2013-10-15 MED ORDER — LOSARTAN POTASSIUM 50 MG PO TABS: ORAL_TABLET | ORAL | Status: DC

## 2013-10-15 NOTE — Telephone Encounter (Signed)
Patient insurance denied Losartan 25 bid. So Julie Baxter changed it to Losartan 50mg  One tablet once daily. Faxed Rx into [facility]. And Patient Notified.

## 2013-10-15 NOTE — Telephone Encounter (Signed)
Patient called and stated that she needed prior auth on Losartan twice daily. I called [facility] # [phone] and spoke with [facility] and she stated that she will send it into clinical review and it will take 24-72 hours. Patient Notified.

## 2013-11-03 ENCOUNTER — Other Ambulatory Visit: Payer: Self-pay | Admitting: *Deleted

## 2013-11-03 MED ORDER — METFORMIN HCL 500 MG PO TABS: 1000.0000 mg | ORAL_TABLET | Freq: Two times a day (BID) | ORAL | Status: DC

## 2013-11-03 NOTE — Telephone Encounter (Signed)
[facility] 

## 2013-11-14 ENCOUNTER — Other Ambulatory Visit: Payer: Self-pay | Admitting: *Deleted

## 2013-11-14 MED ORDER — FUROSEMIDE 20 MG PO TABS: ORAL_TABLET | ORAL | Status: DC

## 2013-11-14 NOTE — Telephone Encounter (Signed)
[facility] 

## 2014-02-16 ENCOUNTER — Encounter: Payer: Self-pay | Admitting: Internal Medicine

## 2014-02-28 ENCOUNTER — Other Ambulatory Visit: Payer: Self-pay | Admitting: Internal Medicine

## 2014-02-28 ENCOUNTER — Other Ambulatory Visit: Payer: Self-pay | Admitting: Adult Health

## 2014-03-03 ENCOUNTER — Other Ambulatory Visit: Payer: Self-pay | Admitting: *Deleted

## 2014-03-04 ENCOUNTER — Telehealth: Payer: Self-pay | Admitting: *Deleted

## 2014-03-04 ENCOUNTER — Other Ambulatory Visit: Payer: Self-pay | Admitting: *Deleted

## 2014-03-04 MED ORDER — AMBULATORY NON FORMULARY MEDICATION: Status: DC

## 2014-03-04 MED ORDER — GLIPIZIDE 5 MG PO TABS: 5.0000 mg | ORAL_TABLET | Freq: Two times a day (BID) | ORAL | Status: DC

## 2014-03-04 MED ORDER — ATENOLOL 50 MG PO TABS: 75.0000 mg | ORAL_TABLET | Freq: Two times a day (BID) | ORAL | Status: DC

## 2014-03-04 NOTE — Telephone Encounter (Signed)
Spoke with daughter concerning her mother accu -check machine, she is not sure of what is needed but will get in touch with mom. Patient will return call in am(03/05/14) or daughter will call back with information needed.

## 2014-03-04 NOTE — Telephone Encounter (Signed)
Left message on daughter phone to call the office-  find out what type of accu-check device her mother is using and what does she need for [place] to order for her.

## 2014-03-13 ENCOUNTER — Other Ambulatory Visit: Payer: Self-pay | Admitting: *Deleted

## 2014-03-13 MED ORDER — AMBULATORY NON FORMULARY MEDICATION: Status: DC

## 2014-03-13 MED ORDER — ROSUVASTATIN CALCIUM 10 MG PO TABS
10.0000 mg | ORAL_TABLET | Freq: Every evening | ORAL | Status: DC
Start: 2014-03-13 — End: 2014-06-25

## 2014-03-13 NOTE — Telephone Encounter (Signed)
[facility] 

## 2014-03-19 IMAGING — CR DG LUMBAR SPINE COMPLETE 4+V
5 series · 5 of 5 positions shown · non-contrast
Comparison: None.

CLINICAL DATA: A right lower back and right hip pain, no injury

LUMBAR SPINE - COMPLETE 4+ VIEW

[t l-spine a.p.]
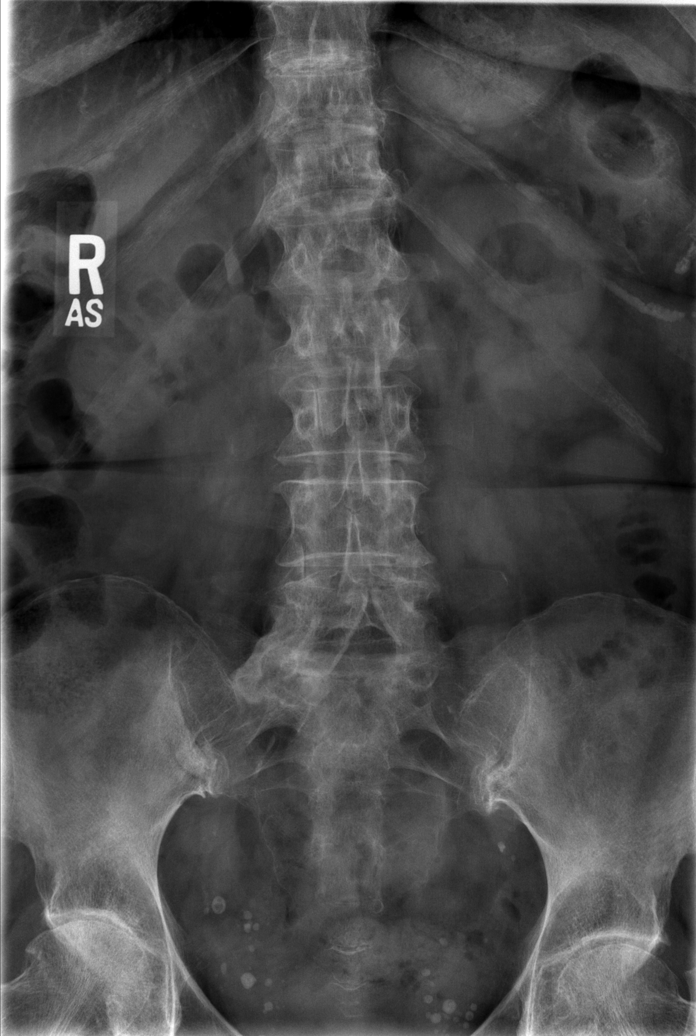

[t l-spine oblique exposure (1 of 2)]
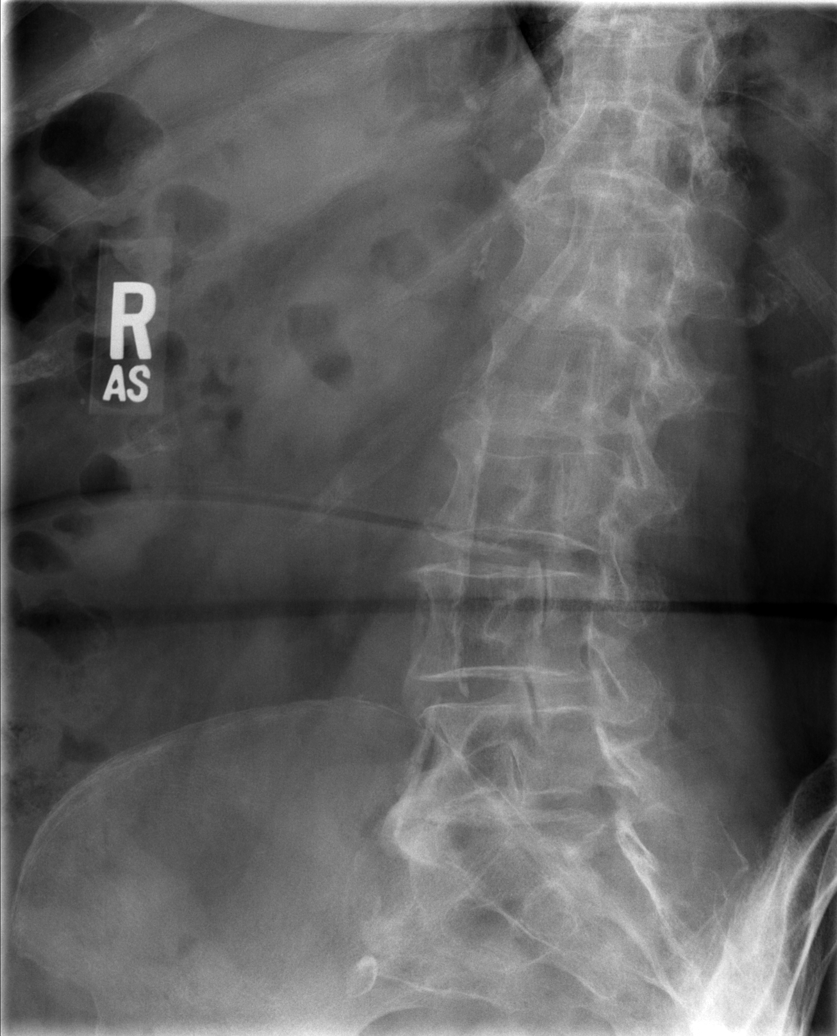

[t l-spine oblique exposure (2 of 2)]
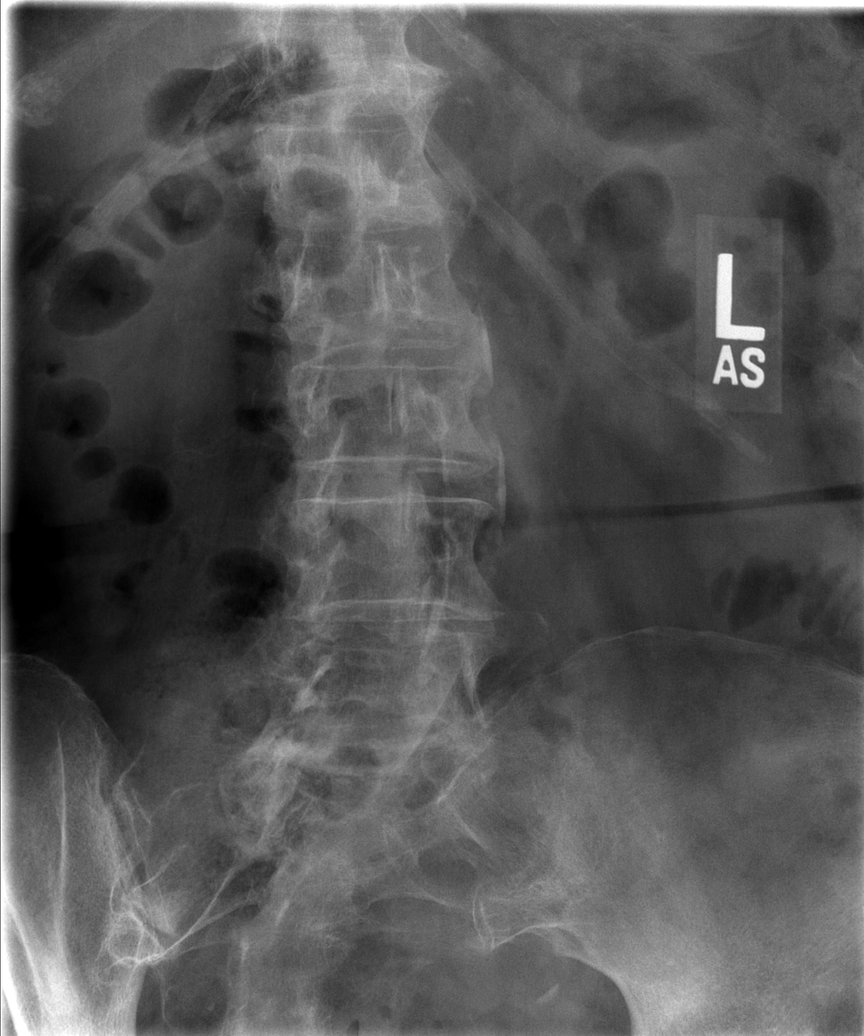

[t l-spine lat]
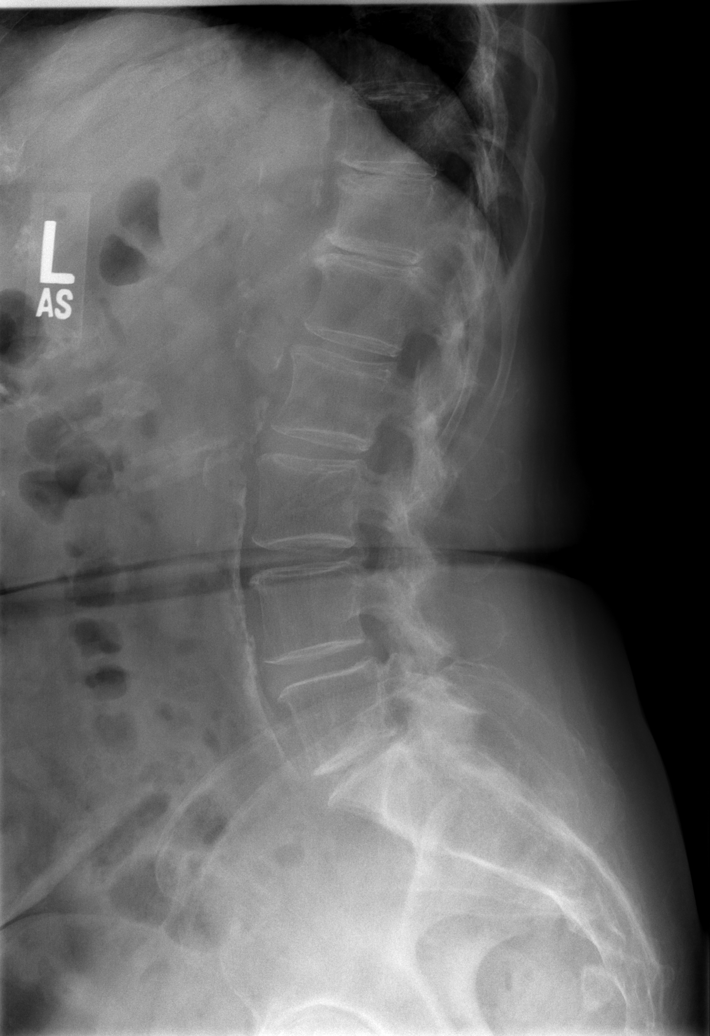

[t l-spine l5-s1 spot]
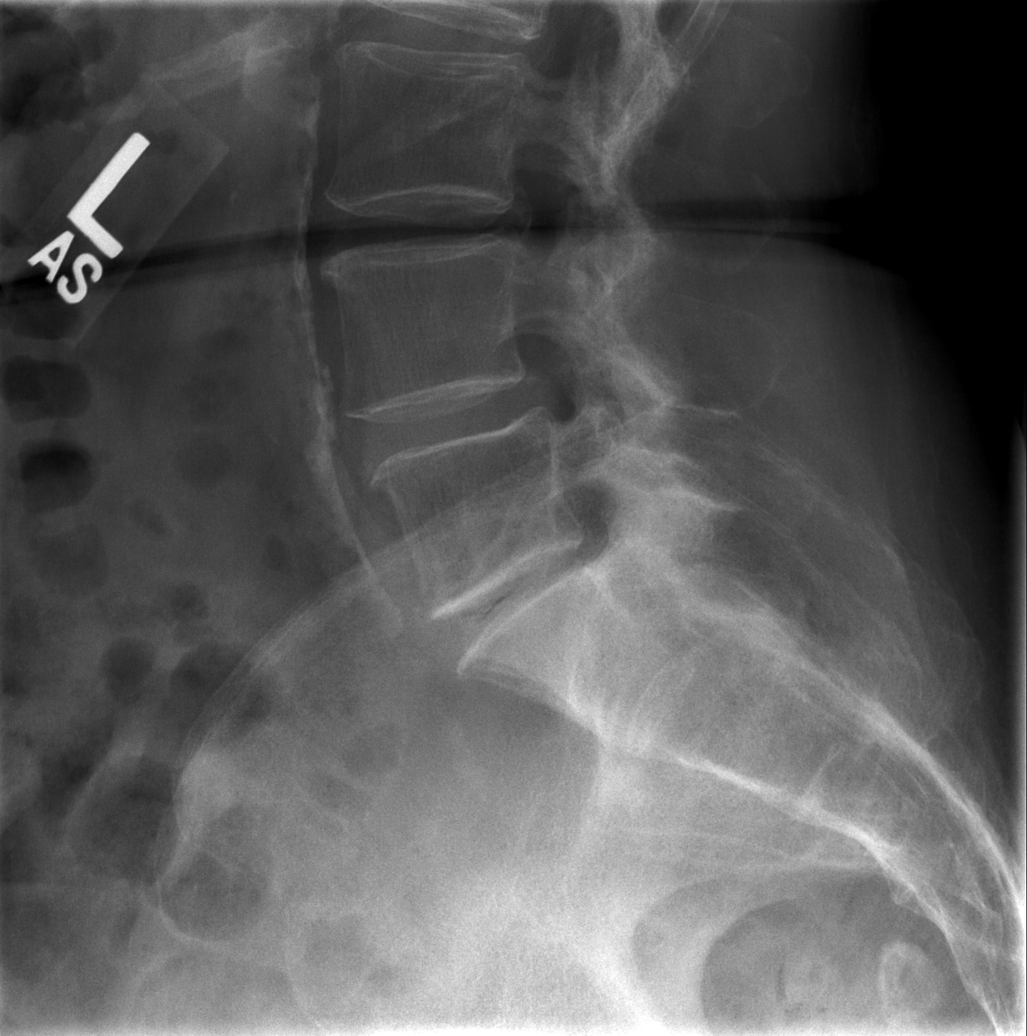

[5 of 5 positions shown; findings below may reference images not displayed]

FINDINGS: The lumbar vertebrae are in normal alignment.  There is
degenerative disc disease at the L5-S1 level.  No compression
deformity is seen.  The bones are somewhat osteopenic.  On oblique
views very little degenerative change of the facet joints is seen.
There are degenerative changes noted in the hips right greater than
left.
IMPRESSION: 1.  Normal alignment with degenerative disc disease at L5-S1.
2.  Degenerative change of the hips right greater than left.

## 2014-06-19 ENCOUNTER — Other Ambulatory Visit: Payer: Medicare Other

## 2014-06-19 DIAGNOSIS — E785 Hyperlipidemia, unspecified: Secondary | ICD-10-CM

## 2014-06-19 DIAGNOSIS — I10 Essential (primary) hypertension: Secondary | ICD-10-CM

## 2014-06-19 DIAGNOSIS — E119 Type 2 diabetes mellitus without complications: Secondary | ICD-10-CM

## 2014-06-20 LAB — LIPID PANEL
Chol/HDL Ratio: 3.2 ratio units (ref 0.0–4.4)
Cholesterol, Total: 155 mg/dL (ref 100–199)
HDL: 48 mg/dL (ref >39)
LDL Calculated: 77 mg/dL (ref 0–99)
Triglycerides: 149 mg/dL (ref 0–149)
VLDL Cholesterol Cal: 30 mg/dL (ref 5–40)

## 2014-06-20 LAB — COMPREHENSIVE METABOLIC PANEL
ALT: 8 IU/L (ref 0–32)
AST: 13 IU/L (ref 0–40)
Albumin/Globulin Ratio: 1.8 (ref 1.1–2.5)
Albumin: 4.1 g/dL (ref 3.5–4.8)
Alkaline Phosphatase: 63 IU/L (ref 39–117)
BUN/Creatinine Ratio: 15 (ref 11–26)
BUN: 16 mg/dL (ref 8–27)
CO2: 25 mmol/L (ref 18–29)
Calcium: 9.5 mg/dL (ref 8.7–10.3)
Chloride: 103 mmol/L (ref 97–108)
Creatinine, Ser: 1.07 mg/dL — ABNORMAL HIGH (ref 0.57–1.00)
GFR calc Af Amer: 58 mL/min/{1.73_m2} — ABNORMAL LOW (ref >59)
GFR calc non Af Amer: 51 mL/min/{1.73_m2} — ABNORMAL LOW (ref >59)
Globulin, Total: 2.3 g/dL (ref 1.5–4.5)
Glucose: 131 mg/dL — ABNORMAL HIGH (ref 65–99)
Potassium: 5.2 mmol/L (ref 3.5–5.2)
Sodium: 143 mmol/L (ref 134–144)
Total Bilirubin: 0.3 mg/dL (ref 0.0–1.2)
Total Protein: 6.4 g/dL (ref 6.0–8.5)

## 2014-06-20 LAB — TSH: TSH: 1.97 u[IU]/mL (ref 0.450–4.500)

## 2014-06-20 LAB — CBC WITH DIFFERENTIAL/PLATELET
Basophils Absolute: 0 10*3/uL (ref 0.0–0.2)
Basos: 1 %
Eos: 4 %
Eosinophils Absolute: 0.3 10*3/uL (ref 0.0–0.4)
HCT: 36.3 % (ref 34.0–46.6)
Hemoglobin: 12.1 g/dL (ref 11.1–15.9)
Immature Grans (Abs): 0 10*3/uL (ref 0.0–0.1)
Immature Granulocytes: 0 %
Lymphocytes Absolute: 2.6 10*3/uL (ref 0.7–3.1)
Lymphs: 36 %
MCH: 30.3 pg (ref 26.6–33.0)
MCHC: 33.3 g/dL (ref 31.5–35.7)
MCV: 91 fL (ref 79–97)
Monocytes Absolute: 1.1 10*3/uL — ABNORMAL HIGH (ref 0.1–0.9)
Monocytes: 15 %
Neutrophils Absolute: 3.2 10*3/uL (ref 1.4–7.0)
Neutrophils Relative %: 44 %
RBC: 4 x10E6/uL (ref 3.77–5.28)
RDW: 13.4 % (ref 12.3–15.4)
WBC: 7.3 10*3/uL (ref 3.4–10.8)

## 2014-06-20 LAB — HEMOGLOBIN A1C
Est. average glucose Bld gHb Est-mCnc: 148 mg/dL
Hgb A1c MFr Bld: 6.8 % — ABNORMAL HIGH (ref 4.8–5.6)

## 2014-06-22 ENCOUNTER — Other Ambulatory Visit: Payer: Self-pay

## 2014-06-24 ENCOUNTER — Telehealth: Payer: Self-pay | Admitting: *Deleted

## 2014-06-24 NOTE — Telephone Encounter (Signed)
Patient has canceled appointment for tomorrow because she had something come up and now has an appointment in March. Patient has only been taking 1/2 tablet of simvastatin 40mg  which is her husbands because hers went to $300.00 out of pocket. Patient has been taking this for a year. Patient is taking BP regularly and it has been good. Wants a Rx Simvastatin faxed to [facility] if this is ok. Still wants the Simvastatin 40mg  and will only take 1/2. Please Advise.

## 2014-06-25 ENCOUNTER — Encounter: Payer: Self-pay | Admitting: Internal Medicine

## 2014-06-25 MED ORDER — SIMVASTATIN 40 MG PO TABS: 40.0000 mg | ORAL_TABLET | Freq: Every day | ORAL | Status: DC

## 2014-06-25 NOTE — Telephone Encounter (Signed)
Continue the simvastatin, but if she keeps missing appointments, we are going to stop filling her medications

## 2014-06-25 NOTE — Telephone Encounter (Signed)
Patient Notified and faxed Rx into pharmacy patient is aware to take 1/2 tablets once daily. Patient will keep appointment in March and call if she needs [place].

## 2014-10-14 DIAGNOSIS — E11339 Type 2 diabetes mellitus with moderate nonproliferative diabetic retinopathy without macular edema: Secondary | ICD-10-CM | POA: Diagnosis not present

## 2014-10-14 DIAGNOSIS — E11359 Type 2 diabetes mellitus with proliferative diabetic retinopathy without macular edema: Secondary | ICD-10-CM | POA: Diagnosis not present

## 2014-10-14 LAB — HM DIABETES EYE EXAM

## 2014-11-06 ENCOUNTER — Ambulatory Visit (INDEPENDENT_AMBULATORY_CARE_PROVIDER_SITE_OTHER): Payer: Medicare Other | Admitting: Internal Medicine

## 2014-11-06 ENCOUNTER — Encounter: Payer: Self-pay | Admitting: Internal Medicine

## 2014-11-06 VITALS — BP 170/100 | HR 61 | Temp 98.2°F | Resp 20 | Ht 64.0 in | Wt 208.4 lb

## 2014-11-06 DIAGNOSIS — M1731 Unilateral post-traumatic osteoarthritis, right knee: Secondary | ICD-10-CM | POA: Diagnosis not present

## 2014-11-06 DIAGNOSIS — F32A Depression, unspecified: Secondary | ICD-10-CM

## 2014-11-06 DIAGNOSIS — E11329 Type 2 diabetes mellitus with mild nonproliferative diabetic retinopathy without macular edema: Secondary | ICD-10-CM

## 2014-11-06 DIAGNOSIS — Z0000 Encounter for general adult medical examination without abnormal findings: Secondary | ICD-10-CM | POA: Diagnosis not present

## 2014-11-06 DIAGNOSIS — I10 Essential (primary) hypertension: Secondary | ICD-10-CM

## 2014-11-06 DIAGNOSIS — M1611 Unilateral primary osteoarthritis, right hip: Secondary | ICD-10-CM

## 2014-11-06 DIAGNOSIS — F329 Major depressive disorder, single episode, unspecified: Secondary | ICD-10-CM | POA: Diagnosis not present

## 2014-11-06 DIAGNOSIS — E113299 Type 2 diabetes mellitus with mild nonproliferative diabetic retinopathy without macular edema, unspecified eye: Secondary | ICD-10-CM

## 2014-11-06 DIAGNOSIS — Z23 Encounter for immunization: Secondary | ICD-10-CM | POA: Diagnosis not present

## 2014-11-06 DIAGNOSIS — E119 Type 2 diabetes mellitus without complications: Secondary | ICD-10-CM

## 2014-11-06 DIAGNOSIS — I6523 Occlusion and stenosis of bilateral carotid arteries: Secondary | ICD-10-CM | POA: Diagnosis not present

## 2014-11-06 DIAGNOSIS — E2839 Other primary ovarian failure: Secondary | ICD-10-CM | POA: Diagnosis not present

## 2014-11-06 MED ORDER — BUPROPION HCL ER (XL) 150 MG PO TB24: 150.0000 mg | ORAL_TABLET | Freq: Every day | ORAL | Status: DC

## 2014-11-06 MED ORDER — TETANUS-DIPHTH-ACELL PERTUSSIS 5-2.5-18.5 LF-MCG/0.5 IM SUSP: 0.5000 mL | Freq: Once | INTRAMUSCULAR | Status: DC

## 2014-11-06 MED ORDER — TRIAMCINOLONE ACETONIDE 40 MG/ML IJ SUSP
40.0000 mg | Freq: Once | INTRAMUSCULAR | Status: AC
  Administered 2014-11-06: 40 mg via INTRA_ARTICULAR

## 2014-11-06 NOTE — Progress Notes (Signed)
Clock test passed

## 2014-11-06 NOTE — Patient Instructions (Signed)
Cont cozaar 1/2 twice daily. Cont tenormin 1.5 twice daily.

## 2014-11-06 NOTE — Progress Notes (Signed)
Patient ID: Julie Baxter, female   DOB: 1936-04-08, 79 y.o.   MRN: [id number]   Location:  [facility]  Code Status: full code  Allergies  Allergen Reactions  . Naproxen Swelling and Other (See Comments)    Blacked out   . Tramadol Nausea And Vomiting    Chief Complaint  Patient presents with  . Annual Exam    Patient would like to talk about shot in Rt. knee    HPI: Patient is a 79 y.o. white female seen in the office today for her annual exam.    BP not usually this high.  Was high yesterday at 157.  This am 147/67.  Record reveals they range anywhere from the 110s-130s the majority of the time.  There are a few outliers in 140s-150s and this am very high.  Also made salty zucchini squash casserole, she says. Has had a lot of stress past few days.  Her husband has h/o heart attack and aneurysm, has chf which has worsened.  Had severe pain when did stress test.  She is very worried about him.  MMSE 28/30, passed clock (missed orientation and copying pentagons).  Says she can still memorize phone numbers.     Right knee painful.  Prior torn cartilage many years ago.  Right knee swells.  If has to walk on cement floors, feels like she'll die afterwards.  Lateral aspect burned like 50 cent piece.  Admits she's lived with it for years.  Still has some nerve pain around lateral aspect of right hip.  Right leg is a little longer.  Sees Dr. Zadie Rhine for her retinopathy.  Refuses colonoscopy.  Pays attention to her stools.    Review of Systems:  Review of Systems  Constitutional: Negative for fever and malaise/fatigue.  HENT: Negative for congestion and hearing loss.   Eyes: Negative for blurred vision.       Follows with ophtho for retinopathy  Respiratory: Negative for shortness of breath.   Cardiovascular: Negative for chest pain.       Elevated bp today--says she ate salty meal and is anxious, is ok at home  Gastrointestinal: Negative  for abdominal pain.  Genitourinary: Negative for dysuria.  Musculoskeletal: Positive for joint pain. Negative for falls.  Skin: Negative for rash.  Neurological: Positive for tingling and sensory change. Negative for dizziness, weakness and headaches.  Endo/Heme/Allergies:       Diabetes  Psychiatric/Behavioral: Positive for depression. Negative for memory loss. The patient is nervous/anxious.        Caregiver stress due to husband's chf     Past Medical History  Diagnosis Date  . Hypertension   . Anxiety     regarding surgery  . Diabetes mellitus without complication   . Peripheral vascular disease     varicose veins-  "Blood Clot anterior RIGHT LEG WITH PREGNANCY"  . Arthritis   . Pain in joint, pelvic region and thigh   . Hyperpotassemia   . Osteoarthrosis, unspecified whether generalized or localized, unspecified site   . Overweight(278.02)   . Diabetic retinopathy   . Chronic kidney disease   . Asymptomatic varicose veins   . Edema   . Type II or unspecified type diabetes mellitus with renal manifestations, not stated as uncontrolled   . Unspecified disorder of kidney and ureter   . Unspecified disorder of kidney and ureter   . Cough   . Pain in joint, lower leg   .  Other specified disease of nail   . Urinary tract infection, site not specified   . Type II or unspecified type diabetes mellitus without mention of complication, uncontrolled   . Other and unspecified hyperlipidemia   . Other abnormal blood chemistry   . Other malaise and fatigue   . Insomnia, unspecified   . Type II or unspecified type diabetes mellitus without mention of complication, not stated as uncontrolled   . Anxiety state, unspecified   . Undiagnosed cardiac murmurs     Past Surgical History  Procedure Laterality Date  . Abdominal hysterectomy    . Eye surgery Bilateral     cataract extraction with IOL  . Spinal meningitis  1957  . Tonsillectomy    . Lipoma excision      x2  bilateral  buttocks  . Total hip arthroplasty Right 11/29/2012    Procedure: RIGHT TOTAL HIP ARTHROPLASTY ANTERIOR APPROACH;  Surgeon: Mcarthur Rossetti, MD;  Location: [facility];  Service: Orthopedics;  Laterality: Right;    Social History:   reports that she has never smoked. She has never used smokeless tobacco. She reports that she does not drink alcohol or use illicit drugs.  No family history on file.  Medications: Patient's Medications  New Prescriptions   No medications on file  Previous Medications   AMBULATORY NON FORMULARY MEDICATION    Medication Name:T/S ACCU-CHK Smartview STP Use to check blood sugar twice daily. Dx 250.00   ASPIRIN 81 MG TABLET    Take one tablet twice daily   ATENOLOL (TENORMIN) 50 MG TABLET    Take 1.5 tablets (75 mg total) by mouth 2 (two) times daily.   FUROSEMIDE (LASIX) 20 MG TABLET    Take 1 tablet by mouth daily for blood pressure and edema.   GLIPIZIDE (GLUCOTROL) 5 MG TABLET    Take 1 tablet (5 mg total) by mouth 2 (two) times daily before a meal.   LOSARTAN (COZAAR) 50 MG TABLET    Take one tablet by mouth once daily to control blood pressure   METFORMIN (GLUCOPHAGE) 500 MG TABLET    Take 2 tablets (1,000 mg total) by mouth 2 (two) times daily with a meal.   METHYLCELLULOSE (ARTIFICIAL TEARS) 1 % OPHTHALMIC SOLUTION    Place 1 drop into both eyes 2 (two) times daily as needed (dry eyes).   OVER THE COUNTER MEDICATION    Take 3 tablets by mouth daily as needed. Restful legs   SIMVASTATIN (ZOCOR) 40 MG TABLET    Take 1 tablet (40 mg total) by mouth daily.  Modified Medications   No medications on file  Discontinued Medications   LINAGLIPTIN (TRADJENTA) 5 MG TABS TABLET    Take 1 tablet (5 mg total) by mouth daily. Along with metformin     Physical Exam: Filed Vitals:   11/06/14 0858  BP: 170/100  Pulse: 61  Temp: 98.2 F (36.8 C)  TempSrc: Oral  Resp: 20  Height: 5\' 4"  (1.626 m)  Weight: 208 lb 6.4 oz (94.53 kg)  SpO2: 98%  Physical Exam    Constitutional: She is oriented to person, place, and time. She appears well-developed and well-nourished. No distress.  Overweight white female  HENT:  Head: Normocephalic and atraumatic.  Right Ear: External ear normal.  Left Ear: External ear normal.  Nose: Nose normal.  Mouth/Throat: Oropharynx is clear and moist. No oropharyngeal exudate.  Eyes: Conjunctivae and EOM are normal. Pupils are equal, round, and reactive to light.  Neck: Normal  range of motion. Neck supple. No JVD present. No thyromegaly present.  No carotid bruits  Cardiovascular: Normal rate, regular rhythm, normal heart sounds and intact distal pulses.   Varicose veins  Pulmonary/Chest: Effort normal and breath sounds normal.  Abdominal: Soft. Bowel sounds are normal. She exhibits no distension and no mass. There is no tenderness.  Genitourinary:  Refuses gyn exam, rectal exam and breast exam; would not even fully disrobe today  Musculoskeletal: Normal range of motion. She exhibits edema and tenderness.  Of right greater than left knee  Lymphadenopathy:    She has no cervical adenopathy.  Neurological: She is alert and oriented to person, place, and time. She has normal reflexes.  See foot exam  Skin: Skin is warm and dry.     Labs reviewed: Basic Metabolic Panel:  Recent Labs  06/19/14 0938  NA 143  K 5.2  CL 103  CO2 25  GLUCOSE 131*  BUN 16  CREATININE 1.07*  CALCIUM 9.5  TSH 1.970   Liver Function Tests:  Recent Labs  06/19/14 0938  AST 13  ALT 8  ALKPHOS 63  BILITOT 0.3  PROT 6.4   No results for input(s): LIPASE, AMYLASE in the last 8760 hours. No results for input(s): AMMONIA in the last 8760 hours. CBC:  Recent Labs  06/19/14 0938  WBC 7.3  NEUTROABS 3.2  HGB 12.1  HCT 36.3  MCV 91   Lipid Panel:  Recent Labs  06/19/14 0938  CHOL 155  HDL 48  LDLCALC 77  TRIG 149  CHOLHDL 3.2   Lab Results  Component Value Date   HGBA1C 6.8* 06/19/2014       Assessment/Plan 1. Routine general medical examination at a health care facility -had diabetic foot exam today; sees Dr. Zadie Rhine regularly for diabetic retinopathy, refuses gyn and breast exams, agrees to [facility] and bone density when mammo due; given prevnar today; says she had flu and pneumovax before but we don't have them on record for this year, refuses zostavax, refuses cscope, Rx given for tetanus booster  2. Essential hypertension -bp elevated today; advised to monitor at home and avoid adding salt to foods; if bps remaining over 140/90, to contact me to we can adjust meds  3. Diabetes mellitus without complication -cont glipizide, glucophage, cozaar, asa 81, check labs -tradjenta was not covered by insurance so will have to use [place] or onglyza if hba1c not at goal -encouraged daily exercise, but want knee and hip to feel better first - CBC with Differential/Platelet; Future - Comprehensive metabolic panel; Future - Hemoglobin A1c; Future - Lipid panel; Future - Pneumococcal conjugate vaccine 13-valent IM  4. Mild nonproliferative diabetic retinopathy without macular edema associated with type 2 diabetes mellitus -cont f/u with Dr. Zadie Rhine - Hemoglobin A1c; Future  5. Primary osteoarthritis of right hip -chronic problem, advised tylenol use for pain when more activity planned  6. Post-traumatic osteoarthritis of right knee -had prior trauma to knee many years ago -steroid/lidocaine injection 50/50 solution injected into lateral aspect of knee after area was cleansed with betadine and marked and sprayed with bupivicaine spray -she noted improvement in pain when completed  7. Depression -Rx given for wellbutrin to try  -advised to take in day b/c it can be stimulatory and I don't want her to be awake at night -used this b/c she does not need to gain more weight - buPROPion (WELLBUTRIN XL) 150 MG 24 hr tablet; Take 1 tablet (150 mg total) by mouth daily.  Dispense: 30  tablet; Refill: 3  8. Need for tetanus booster -Rx provided to get done at pharmacy - Tdap ([place]) 5-2.5-18.5 LF-MCG/0.5 injection; Inject 0.5 mLs into the muscle once.  Dispense: 0.5 mL; Refill: 0  9. Need for vaccination with 13-polyvalent pneumococcal conjugate vaccine - Pneumococcal conjugate vaccine 13-valent IM given today b/c she says she had pneumovax before she started coming here but we have no record of it  10. Estrogen deficiency - has not had bone density and needs one so ordered; encouraged weight bearing exercise and discussed vitamin D supplement, but wants to know results first - DG Bone Density; Future  11. Carotid stenosis, bilateral - due for f/u imaging - US Carotid Bilateral; Future  Labs/tests ordered: Orders Placed This Encounter  Procedures  . DG Bone Density    EPIC ORDER PF:     Standing Status: Future     Number of Occurrences:      Standing Expiration Date: 01/06/2016    Order Specific Question:  Reason for Exam (SYMPTOM  OR DIAGNOSIS REQUIRED)    Answer:  estrogen deficiency    Order Specific Question:  Preferred imaging location?    Answer:  [facility]  . US Carotid Bilateral    208 LBS/NO NEEDS/INS/[employer] MEDICARE/RLC/PT W/EPIC ORDER    Standing Status: Future     Number of Occurrences:      Standing Expiration Date: 01/06/2016    Order Specific Question:  Preferred imaging location?    Answer:  [facility]  . Pneumococcal conjugate vaccine 13-valent IM  . CBC with Differential/Platelet    Standing Status: Future     Number of Occurrences:      Standing Expiration Date: 03/08/2015  . Comprehensive metabolic panel    Standing Status: Future     Number of Occurrences:      Standing Expiration Date: 03/08/2015    Order Specific Question:  Has the patient fasted?    Answer:  Yes  . Hemoglobin A1c    Standing Status: Future     Number of Occurrences:      Standing Expiration Date: 03/08/2015  . Lipid panel    Standing  Status: Future     Number of Occurrences:      Standing Expiration Date: 03/08/2015    Order Specific Question:  Has the patient fasted?    Answer:  Yes    Next appt:  6 mos, but get labs in 2 mos  Hasani Diemer L. Aribelle Mccosh, D.O. [facility] [address], [place] [address] Cell Phone (Mon-Fri 8am-5pm):  [phone] On Call:  [phone] & follow prompts after 5pm & weekends Office Phone:  [phone] Office Fax:  [phone]

## 2014-11-09 ENCOUNTER — Other Ambulatory Visit: Payer: Self-pay | Admitting: Nurse Practitioner

## 2014-11-16 ENCOUNTER — Other Ambulatory Visit: Payer: Self-pay

## 2014-11-23 ENCOUNTER — Ambulatory Visit
Admission: RE | Admit: 2014-11-23 | Discharge: 2014-11-23 | Disposition: A | Discharge status: Home / Self Care | Payer: Medicare Other | Source: Ambulatory Visit | Attending: Internal Medicine | Admitting: Internal Medicine

## 2014-11-23 ENCOUNTER — Other Ambulatory Visit: Payer: Self-pay | Admitting: Internal Medicine

## 2014-11-23 DIAGNOSIS — E042 Nontoxic multinodular goiter: Secondary | ICD-10-CM

## 2014-11-23 DIAGNOSIS — I6523 Occlusion and stenosis of bilateral carotid arteries: Secondary | ICD-10-CM | POA: Diagnosis not present

## 2014-11-24 ENCOUNTER — Other Ambulatory Visit: Payer: Self-pay | Admitting: *Deleted

## 2014-11-25 ENCOUNTER — Ambulatory Visit (INDEPENDENT_AMBULATORY_CARE_PROVIDER_SITE_OTHER): Payer: Medicare Other | Admitting: Internal Medicine

## 2014-11-25 ENCOUNTER — Encounter: Payer: Self-pay | Admitting: Internal Medicine

## 2014-11-25 ENCOUNTER — Telehealth: Payer: Self-pay | Admitting: *Deleted

## 2014-11-25 VITALS — BP 178/98 | HR 64 | Temp 98.2°F | Wt 204.0 lb

## 2014-11-25 DIAGNOSIS — I10 Essential (primary) hypertension: Secondary | ICD-10-CM | POA: Diagnosis not present

## 2014-11-25 DIAGNOSIS — F411 Generalized anxiety disorder: Secondary | ICD-10-CM | POA: Diagnosis not present

## 2014-11-25 MED ORDER — LOSARTAN POTASSIUM 50 MG PO TABS: ORAL_TABLET | ORAL | Status: DC

## 2014-11-25 NOTE — Progress Notes (Signed)
Patient ID: Julie Baxter, female   DOB: [birth year], 79 y.o.   MRN: [id number]    Facility  [facility]    Place of Service:   OFFICE   Allergies  Allergen Reactions  . Naproxen Swelling and Other (See Comments)    Blacked out   . Tramadol Nausea And Vomiting    Chief Complaint  Patient presents with  . Acute Visit    Complains of High blood pressure. Monday it was 200/85am, 158/78pm and today it was 217/100 and confirmed at fire dept. Patient states she is under alot of stress. Patient did take and extra 1/2 tablet of Atenolol and 2 baby Aspirins.     HPI:  Essential hypertension: Recent blood pressures have been quite elevated. She attributes this to stress related to her husband's health. He has some cardiac problems. They have been married 34 years. She denies headache, visual disturbance, shortness of breath, chest discomfort, palpitations, nausea, or new medications that might elevate blood pressure  Generalized anxiety disorder: Related to her husband's health and her own hypertensive blood pressures recently. She is not using the bupropion which had been prescribed by Dr. Mariea Clonts.    Medications: Patient's Medications  New Prescriptions   No medications on file  Previous Medications   AMBULATORY NON FORMULARY MEDICATION    Medication Name:T/S ACCU-CHK Smartview STP Use to check blood sugar twice daily. Dx 250.00   ASPIRIN 81 MG TABLET    Take one tablet twice daily   ATENOLOL (TENORMIN) 50 MG TABLET    Take 1.5 tablets (75 mg total) by mouth 2 (two) times daily.   BUPROPION (WELLBUTRIN XL) 150 MG 24 HR TABLET    Take 1 tablet (150 mg total) by mouth daily.   FUROSEMIDE (LASIX) 20 MG TABLET    Take 1 tablet by mouth daily for blood pressure and edema.   GLIPIZIDE (GLUCOTROL) 5 MG TABLET    Take 1 tablet (5 mg total) by mouth 2 (two) times daily before a meal.   LOSARTAN (COZAAR) 50 MG TABLET    Take one tablet by mouth  once daily to control blood pressure   METFORMIN  (GLUCOPHAGE) 500 MG TABLET    Take 2 tablets (1,000 mg total) by mouth 2 (two) times daily with a meal.   METHYLCELLULOSE (ARTIFICIAL TEARS) 1 % OPHTHALMIC SOLUTION    Place 1 drop into both eyes 2 (two) times daily as needed (dry eyes).   OVER THE COUNTER MEDICATION    Take 3 tablets by mouth daily as needed. Restful legs   SIMVASTATIN (ZOCOR) 40 MG TABLET    Take 1 tablet (40 mg total) by mouth daily.   TDAP (BOOSTRIX) 5-2.5-18.5 LF-MCG/0.5 INJECTION    Inject 0.5 mLs into the muscle once.  Modified Medications   No medications on file  Discontinued Medications   No medications on file     Review of Systems  Constitutional: Negative for fever.  HENT: Negative for congestion and hearing loss.   Eyes:       Follows with ophtho for retinopathy  Respiratory: Negative for shortness of breath.   Cardiovascular: Negative for chest pain.       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 She attributes to anxiety about her husband's health.  Gastrointestinal: Negative for abdominal pain.  Genitourinary: Negative for dysuria.  Skin: Negative for rash.  Neurological: Negative for dizziness, weakness and headaches.  Hematological:  Diabetes  Psychiatric/Behavioral: The patient is nervous/anxious.        Caregiver stress due to husband's chf    Filed Vitals:   11/25/14 1458  BP: 178/98  Pulse: 64  Temp: 98.2 F (36.8 C)  TempSrc: Oral  Weight: 204 lb (92.534 kg)   Body mass index is 35 kg/(m^2).  Physical Exam  Constitutional: She is oriented to person, place, and time. She appears well-developed and well-nourished. No distress.  Overweight white female  HENT:  Head: Normocephalic and atraumatic.  Right Ear: External ear normal.  Left Ear: External ear normal.  Nose: Nose normal.  Mouth/Throat: Oropharynx is clear and moist. No oropharyngeal exudate.  Eyes: Conjunctivae and EOM are normal. Pupils are equal, round, and reactive to  light.  Neck: Normal range of motion. Neck supple. No JVD present. No thyromegaly present.  No carotid bruits  Cardiovascular: Normal rate, regular rhythm, normal heart sounds and intact distal pulses.   Varicose veins  Pulmonary/Chest: Effort normal and breath sounds normal.  Abdominal: Soft. Bowel sounds are normal. She exhibits no distension and no mass. There is no tenderness.  Genitourinary:  Refuses gyn exam, rectal exam and breast exam; would not even fully disrobe today  Musculoskeletal: Normal range of motion. She exhibits edema and tenderness.  Of right greater than left knee  Lymphadenopathy:    She has no cervical adenopathy.  Neurological: She is alert and oriented to person, place, and time. She has normal reflexes.  See foot exam  Skin: Skin is warm and dry. No rash noted. No erythema.  Psychiatric: Judgment and thought content normal.  Increased anxiety.     Labs reviewed: Abstract on 10/14/2014  Component Date Value Ref Range Status  . HM Diabetic Eye Exam 10/14/2014 Retinopathy* No Retinopathy Final     Assessment/Plan  1. Essential hypertension Increase losartan - losartan (COZAAR) 50 MG tablet; Take one tablet twice daily to control blood pressure  Dispense: 180 tablet; Refill: 1  2. Generalized anxiety disorder Start bupropion is recommended.

## 2014-11-25 NOTE — Telephone Encounter (Signed)
Patient came in for an appointment today to see Dr. Nyoka Cowden. Printed message for chart.

## 2014-11-25 NOTE — Telephone Encounter (Signed)
Let's add amlodipine 10mg  po daily.  Continue to check blood pressures twice daily and call if still remain over 140/90.

## 2014-11-25 NOTE — Telephone Encounter (Signed)
LMOM to return call.

## 2014-11-25 NOTE — Patient Instructions (Signed)
Start Wellbutrin as recommended. Increase the losartan to 50 mg twice daily.

## 2014-11-25 NOTE — Telephone Encounter (Signed)
Patient stated that her BP has been running high. Monday it was 200/85 am, 158/78 pm and today it was 217/100 am Patient stated that she is under a lot of stress with Ray. Patient took this morning 1/2 Losartan, 1 1/2 Atenolol and an extra 1/2 Atenolol and 2 Baby Aspirin. Patient does not want an appointment or to go to Urgent Care. Please Advise.

## 2014-12-01 ENCOUNTER — Ambulatory Visit
Admission: RE | Admit: 2014-12-01 | Discharge: 2014-12-01 | Disposition: A | Discharge status: Home / Self Care | Payer: Medicare Other | Source: Ambulatory Visit | Attending: Internal Medicine | Admitting: Internal Medicine

## 2014-12-01 ENCOUNTER — Other Ambulatory Visit: Payer: Self-pay | Admitting: Internal Medicine

## 2014-12-01 DIAGNOSIS — E042 Nontoxic multinodular goiter: Secondary | ICD-10-CM

## 2014-12-02 ENCOUNTER — Other Ambulatory Visit: Payer: Self-pay | Admitting: Internal Medicine

## 2014-12-02 ENCOUNTER — Telehealth: Payer: Self-pay

## 2014-12-02 DIAGNOSIS — E042 Nontoxic multinodular goiter: Secondary | ICD-10-CM

## 2014-12-02 NOTE — Addendum Note (Signed)
Addended by: Logan Bores on: 12/02/2014 04:01 PM   Modules accepted: Orders

## 2014-12-02 NOTE — Telephone Encounter (Signed)
[facility] needs more information to process Thyroid Biopsy. The order needs to be specific to which goiters need to be biopsied. If left and right goiters need to be biopsied then  2 orders need to be placed for each side.    Please advise

## 2014-12-02 NOTE — Telephone Encounter (Signed)
It is both sides.  The radiologists there read the report and recommended both sides.

## 2014-12-08 ENCOUNTER — Ambulatory Visit
Admission: RE | Admit: 2014-12-08 | Discharge: 2014-12-08 | Disposition: A | Discharge status: Home / Self Care | Payer: Medicare Other | Source: Ambulatory Visit | Attending: Internal Medicine | Admitting: Internal Medicine

## 2014-12-08 ENCOUNTER — Other Ambulatory Visit (HOSPITAL_COMMUNITY)
Admission: RE | Admit: 2014-12-08 | Discharge: 2014-12-08 | Disposition: A | Discharge status: Home / Self Care | Payer: Medicare Other | Source: Ambulatory Visit | Attending: Interventional Radiology | Admitting: Interventional Radiology

## 2014-12-08 DIAGNOSIS — E041 Nontoxic single thyroid nodule: Secondary | ICD-10-CM | POA: Insufficient documentation

## 2014-12-08 DIAGNOSIS — E042 Nontoxic multinodular goiter: Secondary | ICD-10-CM | POA: Diagnosis not present

## 2014-12-09 ENCOUNTER — Ambulatory Visit (INDEPENDENT_AMBULATORY_CARE_PROVIDER_SITE_OTHER): Payer: Medicare Other | Admitting: Internal Medicine

## 2014-12-09 ENCOUNTER — Encounter: Payer: Self-pay | Admitting: Internal Medicine

## 2014-12-09 VITALS — BP 134/86 | HR 62 | Temp 98.2°F | Ht 64.0 in | Wt 202.8 lb

## 2014-12-09 DIAGNOSIS — I10 Essential (primary) hypertension: Secondary | ICD-10-CM

## 2014-12-09 DIAGNOSIS — R42 Dizziness and giddiness: Secondary | ICD-10-CM | POA: Insufficient documentation

## 2014-12-09 DIAGNOSIS — E041 Nontoxic single thyroid nodule: Secondary | ICD-10-CM | POA: Diagnosis not present

## 2014-12-09 NOTE — Progress Notes (Signed)
Patient ID: Julie Baxter, female   DOB: 1935/10/12, 79 y.o.   MRN: [id number]    Facility  [facility]    Place of Service:   OFFICE    Allergies  Allergen Reactions  . Naproxen Swelling and Other (See Comments)    Blacked out   . Tramadol Nausea And Vomiting    Chief Complaint  Patient presents with  . Medical Management of Chronic Issues    2 Week Follow up. Wants to discuss Thyroid Biopsy    HPI:  Essential hypertension: Controlled  Dizzy spells: Episodes of dizziness. It does not seem to have much to do with head positioning turning or standing. They occur abruptly and lasted less than 2 minutes. They're not accompanied by recognized palpitations, changes in vision, diaphoresis, or dyspnea. Patient was noted she can take meclizine.  Cystic thyroid nodule: Biopsy was arranged by Dr. Mariea Clonts when thyroid nodules were found incidentally on ultrasound of the carotid on 11/23/2014. Nodules were biopsied on both sides of the thyroid. Pathology report returned as benign follicular nodules.   Medications: Patient's Medications  New Prescriptions   No medications on file  Previous Medications   AMBULATORY NON FORMULARY MEDICATION    Medication Name:T/S ACCU-CHK Smartview STP Use to check blood sugar twice daily. Dx 250.00   ASPIRIN 81 MG TABLET    Take one tablet twice daily   ATENOLOL (TENORMIN) 50 MG TABLET    Take 1 and 1/2 tablets by  mouth twice daily   BUPROPION (WELLBUTRIN XL) 150 MG 24 HR TABLET    Take 1 tablet (150 mg total) by mouth daily.   FUROSEMIDE (LASIX) 20 MG TABLET    Take 1 tablet by mouth daily for blood pressure and edema.   GLIPIZIDE (GLUCOTROL) 5 MG TABLET    Take 1 tablet by mouth  twice a day before meals   LOSARTAN (COZAAR) 50 MG TABLET    Take one tablet twice daily to control blood pressure   METFORMIN (GLUCOPHAGE) 500 MG TABLET    Take 2 tablets (1,000 mg total) by mouth 2 (two) times daily with a meal.   METHYLCELLULOSE (ARTIFICIAL TEARS) 1 % OPHTHALMIC  SOLUTION    Place 1 drop into both eyes 2 (two) times daily as needed (dry eyes).   OVER THE COUNTER MEDICATION    Take 3 tablets by mouth daily as needed. Restful legs   SIMVASTATIN (ZOCOR) 40 MG TABLET    Take 1 tablet (40 mg total) by mouth daily.  Modified Medications   No medications on file  Discontinued Medications   TDAP (BOOSTRIX) 5-2.5-18.5 LF-MCG/0.5 INJECTION    Inject 0.5 mLs into the muscle once.     Review of Systems  Constitutional: Negative for fever.  HENT: Negative for congestion and hearing loss.   Eyes:       Follows with ophtho for retinopathy  Respiratory: Negative for shortness of breath.   Cardiovascular: Negative for chest pain.       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 She attributes to anxiety about her husband's health.  Gastrointestinal: Negative for abdominal pain.  Genitourinary: Negative for dysuria.  Skin: Negative for rash.  Neurological: Negative for dizziness, weakness and headaches.  Hematological:       Diabetes  Psychiatric/Behavioral: The patient is nervous/anxious.        Caregiver stress due to husband's chf    Filed Vitals:   12/09/14 1606  BP: 134/86  Pulse: 62  Temp: 98.2 F (36.8 C)  TempSrc: Oral  Height: 5\' 4"  (1.626 m)  Weight: 202 lb 12.8 oz (91.989 kg)   Body mass index is 34.79 kg/(m^2).  Physical Exam  Constitutional: She is oriented to person, place, and time. She appears well-developed and well-nourished. No distress.  Overweight white female  HENT:  Head: Normocephalic and atraumatic.  Right Ear: External ear normal.  Left Ear: External ear normal.  Nose: Nose normal.  Mouth/Throat: Oropharynx is clear and moist. No oropharyngeal exudate.  Eyes: Conjunctivae and EOM are normal. Pupils are equal, round, and reactive to light.  Neck: Normal range of motion. Neck supple. No JVD present. No thyromegaly present.  No carotid bruits  Cardiovascular: Normal rate,  regular rhythm, normal heart sounds and intact distal pulses.   Varicose veins  Pulmonary/Chest: Effort normal and breath sounds normal.  Abdominal: Soft. Bowel sounds are normal. She exhibits no distension and no mass. There is no tenderness.  Musculoskeletal: Normal range of motion. She exhibits edema and tenderness.  Of right greater than left knee  Lymphadenopathy:    She has no cervical adenopathy.  Neurological: She is alert and oriented to person, place, and time. She has normal reflexes.  Skin: Skin is warm and dry. No rash noted. No erythema.  Psychiatric: Judgment and thought content normal.  Increased anxiety.     Labs reviewed: Abstract on 10/14/2014  Component Date Value Ref Range Status  . HM Diabetic Eye Exam 10/14/2014 Retinopathy* No Retinopathy Final     Assessment/Plan 1. Essential hypertension Changes in medications made at her last visit are now controlling her blood pressure adequately.  2. Dizzy spells I cautioned against use of meclizine. The drug would not even be absorbed in the period of time that her dizzy spells occur.  3. Cystic thyroid nodule Benign multinodular goiter

## 2015-01-04 ENCOUNTER — Telehealth: Payer: Self-pay | Admitting: *Deleted

## 2015-01-04 NOTE — Telephone Encounter (Signed)
Patient Notified and agreed. 

## 2015-01-04 NOTE — Telephone Encounter (Signed)
LMOM to return call.

## 2015-01-04 NOTE — Telephone Encounter (Signed)
It has to be 3 to 4 mos between the knee injections.  I gave her last knee injection in March so we can't do it until at least June

## 2015-01-04 NOTE — Telephone Encounter (Signed)
Patient called and wanted to know since she had a shot in March in her left hip area if it would be ok to come back and get a Right knee injection this Thursday. Please Advise.

## 2015-01-06 ENCOUNTER — Other Ambulatory Visit: Payer: Medicare Other

## 2015-01-09 ENCOUNTER — Other Ambulatory Visit: Payer: Self-pay | Admitting: Internal Medicine

## 2015-01-13 ENCOUNTER — Other Ambulatory Visit: Payer: Medicare Other

## 2015-01-13 DIAGNOSIS — E11329 Type 2 diabetes mellitus with mild nonproliferative diabetic retinopathy without macular edema: Secondary | ICD-10-CM | POA: Diagnosis not present

## 2015-01-13 DIAGNOSIS — E119 Type 2 diabetes mellitus without complications: Secondary | ICD-10-CM

## 2015-01-13 DIAGNOSIS — E113299 Type 2 diabetes mellitus with mild nonproliferative diabetic retinopathy without macular edema, unspecified eye: Secondary | ICD-10-CM

## 2015-01-14 ENCOUNTER — Other Ambulatory Visit: Payer: Self-pay | Admitting: *Deleted

## 2015-01-14 LAB — COMPREHENSIVE METABOLIC PANEL
ALT: 12 IU/L (ref 0–32)
AST: 13 IU/L (ref 0–40)
Albumin/Globulin Ratio: 1.4 (ref 1.1–2.5)
Albumin: 4 g/dL (ref 3.5–4.8)
Alkaline Phosphatase: 67 IU/L (ref 39–117)
BUN/Creatinine Ratio: 16 (ref 11–26)
BUN: 16 mg/dL (ref 8–27)
Bilirubin Total: 0.5 mg/dL (ref 0.0–1.2)
CO2: 25 mmol/L (ref 18–29)
Calcium: 9.3 mg/dL (ref 8.7–10.3)
Chloride: 101 mmol/L (ref 97–108)
Creatinine, Ser: 1.02 mg/dL — ABNORMAL HIGH (ref 0.57–1.00)
GFR calc Af Amer: 62 mL/min/{1.73_m2} (ref >59)
GFR calc non Af Amer: 54 mL/min/{1.73_m2} — ABNORMAL LOW (ref >59)
Globulin, Total: 2.8 g/dL (ref 1.5–4.5)
Glucose: 133 mg/dL — ABNORMAL HIGH (ref 65–99)
Potassium: 5.1 mmol/L (ref 3.5–5.2)
Sodium: 141 mmol/L (ref 134–144)
Total Protein: 6.8 g/dL (ref 6.0–8.5)

## 2015-01-14 LAB — CBC WITH DIFFERENTIAL/PLATELET
Basophils Absolute: 0 10*3/uL (ref 0.0–0.2)
Basos: 0 %
EOS (ABSOLUTE): 0.2 10*3/uL (ref 0.0–0.4)
Eos: 2 %
Hematocrit: 38.4 % (ref 34.0–46.6)
Hemoglobin: 12.7 g/dL (ref 11.1–15.9)
Immature Grans (Abs): 0 10*3/uL (ref 0.0–0.1)
Immature Granulocytes: 0 %
Lymphocytes Absolute: 2.4 10*3/uL (ref 0.7–3.1)
Lymphs: 21 %
MCH: 31.1 pg (ref 26.6–33.0)
MCHC: 33.1 g/dL (ref 31.5–35.7)
MCV: 94 fL (ref 79–97)
Monocytes Absolute: 1.4 10*3/uL — ABNORMAL HIGH (ref 0.1–0.9)
Monocytes: 12 %
Neutrophils Absolute: 7.4 10*3/uL — ABNORMAL HIGH (ref 1.4–7.0)
Neutrophils: 65 %
Platelets: 206 10*3/uL (ref 150–379)
RBC: 4.08 x10E6/uL (ref 3.77–5.28)
RDW: 14.2 % (ref 12.3–15.4)
WBC: 11.4 10*3/uL — ABNORMAL HIGH (ref 3.4–10.8)

## 2015-01-14 LAB — HEMOGLOBIN A1C
Est. average glucose Bld gHb Est-mCnc: 171 mg/dL
Hgb A1c MFr Bld: 7.6 % — ABNORMAL HIGH (ref 4.8–5.6)

## 2015-01-14 LAB — LIPID PANEL
Chol/HDL Ratio: 3 ratio units (ref 0.0–4.4)
Cholesterol, Total: 143 mg/dL (ref 100–199)
HDL: 48 mg/dL (ref >39)
LDL Calculated: 56 mg/dL (ref 0–99)
Triglycerides: 195 mg/dL — ABNORMAL HIGH (ref 0–149)
VLDL Cholesterol Cal: 39 mg/dL (ref 5–40)

## 2015-01-14 MED ORDER — AMBULATORY NON FORMULARY MEDICATION: Status: DC

## 2015-01-14 NOTE — Telephone Encounter (Signed)
[facility] 

## 2015-01-21 ENCOUNTER — Telehealth: Payer: Self-pay | Admitting: *Deleted

## 2015-01-21 NOTE — Telephone Encounter (Signed)
Patient called and left message on voice mail wanting results from bloodwork on 01/13/15. I tried calling patient back and left message to return my call on home number. I also tried calling cell number listed and it doesn't accept call.

## 2015-01-21 NOTE — Telephone Encounter (Signed)
Tried calling patient at 1:05 and number rang busy. Will try again later.

## 2015-01-21 NOTE — Telephone Encounter (Signed)
Patient Notified of her bloodwork results. Patient has had a steroid injection. Appointment scheduled to see Dr. Mariea Clonts 02/04/15.

## 2015-02-04 ENCOUNTER — Ambulatory Visit (INDEPENDENT_AMBULATORY_CARE_PROVIDER_SITE_OTHER): Payer: Medicare Other | Admitting: Internal Medicine

## 2015-02-04 ENCOUNTER — Encounter: Payer: Self-pay | Admitting: Internal Medicine

## 2015-02-04 VITALS — BP 140/80 | HR 62 | Temp 98.0°F | Resp 20 | Ht 64.0 in | Wt 202.4 lb

## 2015-02-04 DIAGNOSIS — M2170 Unequal limb length (acquired), unspecified site: Secondary | ICD-10-CM | POA: Diagnosis not present

## 2015-02-04 DIAGNOSIS — M25552 Pain in left hip: Secondary | ICD-10-CM | POA: Diagnosis not present

## 2015-02-04 DIAGNOSIS — E781 Pure hyperglyceridemia: Secondary | ICD-10-CM | POA: Diagnosis not present

## 2015-02-04 DIAGNOSIS — D72829 Elevated white blood cell count, unspecified: Secondary | ICD-10-CM | POA: Diagnosis not present

## 2015-02-04 DIAGNOSIS — E11319 Type 2 diabetes mellitus with unspecified diabetic retinopathy without macular edema: Secondary | ICD-10-CM

## 2015-02-04 MED ORDER — GLIPIZIDE 5 MG PO TABS: ORAL_TABLET | ORAL | Status: DC

## 2015-02-04 NOTE — Progress Notes (Signed)
Patient ID: Julie Baxter, female   DOB: Jun 09, 1936, 79 y.o.   MRN: [id number]   Location:  [facility]  Code Status: full code Goals of Care: Advanced Directive information Does patient have an advance directive?: No, Would patient like information on creating an advanced directive?: Yes - Educational materials given (in the past, but has not yet completed and brought [place] a copy)   Chief Complaint  Patient presents with  . Medical Management of Chronic Issues    of diabetes, arthritis, hypertriglyceridemia    HPI: Patient is a 79 y.o.  seen in the office today for med mgt of chronic diseases primarily her diabetes, cholesterol and arthritis.    Looks like MAs had difficulty reaching reaching her--does not use her cell phone and suspect that was the case.   Got her steroid injection in March.  Knee pain never returned, but left hip began hurting afterwards--says she cannot have surgery for this at this point after the 800 tomato plants are through like in October--doesn't want to get xray now if she can't go ahead with the ortho referral.  Using salonpas on there.  Cannot walk much on the hip.  Has difficulty making it to the car.  Right leg is longer.  Has been on crestor and zetia in the past.  Is taking her diabetes meds as follows:  2 of the 500mg  metformin at breakfast, one at lunch and one at supper.  Has been taking her glipizide 5mg  at breakfast and supper.  Has been having lows in the middle of the night.  Discussed moving her   When had right hip done 2 years ago, has had leg length discrepancy issue since.  Has previously seen a chiropractor in [place].      Review of Systems:  Review of Systems  Constitutional: Negative for fever and chills.  HENT: Negative for congestion.   Respiratory: Negative for shortness of breath.   Cardiovascular: Negative for chest pain and leg swelling.  Gastrointestinal: Negative for abdominal pain  and constipation.  Genitourinary: Negative for dysuria, urgency and frequency.  Musculoskeletal: Positive for myalgias and joint pain. Negative for falls.  Skin: Negative for rash.  Neurological: Negative for dizziness.  Psychiatric/Behavioral: Negative for memory loss. The patient is nervous/anxious.     Past Medical History  Diagnosis Date  . Hypertension   . Anxiety     regarding surgery  . Diabetes mellitus without complication   . Peripheral vascular disease     varicose veins-  "Blood Clot anterior RIGHT LEG WITH PREGNANCY"  . Arthritis   . Pain in joint, pelvic region and thigh   . Hyperpotassemia   . Osteoarthrosis, unspecified whether generalized or localized, unspecified site   . Overweight(278.02)   . Diabetic retinopathy   . Chronic kidney disease   . Asymptomatic varicose veins   . Edema   . Type II or unspecified type diabetes mellitus with renal manifestations, not stated as uncontrolled   . Unspecified disorder of kidney and ureter   . Unspecified disorder of kidney and ureter   . Cough   . Pain in joint, lower leg   . Other specified disease of nail   . Urinary tract infection, site not specified   . Type II or unspecified type diabetes mellitus without mention of complication, uncontrolled   . Other and unspecified hyperlipidemia   . Other abnormal blood chemistry   . Other malaise and fatigue   .  Insomnia, unspecified   . Type II or unspecified type diabetes mellitus without mention of complication, not stated as uncontrolled   . Anxiety state, unspecified   . Undiagnosed cardiac murmurs     Past Surgical History  Procedure Laterality Date  . Abdominal hysterectomy    . Eye surgery Bilateral     cataract extraction with IOL  . Spinal meningitis  1957  . Tonsillectomy    . Lipoma excision      x2  bilateral buttocks  . Total hip arthroplasty Right 11/29/2012    Procedure: RIGHT TOTAL HIP ARTHROPLASTY ANTERIOR APPROACH;  Surgeon: Mcarthur Rossetti, MD;  Location: [facility];  Service: Orthopedics;  Laterality: Right;    Allergies  Allergen Reactions  . Naproxen Swelling and Other (See Comments)    Blacked out   . Tramadol Nausea And Vomiting   Medications: Patient's Medications  New Prescriptions   No medications on file  Previous Medications   AMBULATORY NON FORMULARY MEDICATION    Medication Name:T/S ACCU-CHK Smartview STP Use to check blood sugar twice daily. Dx 250.00   AMBULATORY NON FORMULARY MEDICATION    Fastclix Lancets Sig: Use to test blood sugar twice daily. DX: E11.65   ASPIRIN 81 MG TABLET    Take one tablet twice daily   ATENOLOL (TENORMIN) 50 MG TABLET    Take 1 and 1/2 tablets by  mouth twice daily   BUPROPION (WELLBUTRIN XL) 150 MG 24 HR TABLET    Take 1 tablet (150 mg total) by mouth daily.   FUROSEMIDE (LASIX) 20 MG TABLET    Take 1 tablet by mouth  daily for blood pressure  and edema.   LOSARTAN (COZAAR) 50 MG TABLET    Take one tablet twice daily to control blood pressure   METFORMIN (GLUCOPHAGE) 500 MG TABLET    Take 2 tablets by mouth two times daily with meals   METHYLCELLULOSE (ARTIFICIAL TEARS) 1 % OPHTHALMIC SOLUTION    Place 1 drop into both eyes 2 (two) times daily as needed (dry eyes).   OVER THE COUNTER MEDICATION    Take 3 tablets by mouth daily as needed. Restful legs   SIMVASTATIN (ZOCOR) 40 MG TABLET    Take 1 tablet (40 mg total) by mouth daily.  Modified Medications   Modified Medication Previous Medication   GLIPIZIDE (GLUCOTROL) 5 MG TABLET glipiZIDE (GLUCOTROL) 5 MG tablet      Take 1 tablet by mouth  twice a day before meals (breakfast and lunch)    Take 1 tablet by mouth  twice a day before meals  Discontinued Medications   No medications on file    Physical Exam: Filed Vitals:   02/04/15 0738  BP: 140/80  Pulse: 62  Temp: 98 F (36.7 C)  TempSrc: Oral  Resp: 20  Height: 5\' 4"  (1.626 m)  Weight: 202 lb 6.4 oz (91.808 kg)  SpO2: 97%   Physical Exam  Constitutional:  She is oriented to person, place, and time. She appears well-developed and well-nourished. No distress.  Cardiovascular: Normal rate, regular rhythm, normal heart sounds and intact distal pulses.   Pulmonary/Chest: Effort normal and breath sounds normal.  Abdominal: Soft. Bowel sounds are normal. She exhibits no distension and no mass. There is no tenderness.  Musculoskeletal: Normal range of motion. She exhibits tenderness.  Of left hip over trochanteric bursa, but also into SI joint/buttocks  Neurological: She is alert and oriented to person, place, and time.  Skin: Skin is warm and  dry.  Psychiatric: She has a normal mood and affect.    Labs reviewed: Basic Metabolic Panel:  Recent Labs  06/19/14 0938 01/13/15 0834  NA 143 141  K 5.2 5.1  CL 103 101  CO2 25 25  GLUCOSE 131* 133*  BUN 16 16  CREATININE 1.07* 1.02*  CALCIUM 9.5 9.3  TSH 1.970  --    Liver Function Tests:  Recent Labs  06/19/14 0938 01/13/15 0834  AST 13 13  ALT 8 12  ALKPHOS 63 67  BILITOT 0.3 0.5  PROT 6.4 6.8  CBC:  Recent Labs  06/19/14 0938 01/13/15 0834  WBC 7.3 11.4*  NEUTROABS 3.2 7.4*  HGB 12.1  --   HCT 36.3 38.4  MCV 91  --    Lipid Panel:  Recent Labs  06/19/14 0938 01/13/15 0834  CHOL 155 143  HDL 48 48  LDLCALC 77 56  TRIG 149 195*  CHOLHDL 3.2 3.0   Lab Results  Component Value Date   HGBA1C 7.6* 01/13/2015    Assessment/Plan 1. Type 2 diabetes mellitus with diabetic retinopathy, macular edema presence unspecified, with unspecified retinopathy severity - cont metformin 2 tabs at breakfast and one at lunch and one at supper - cont glipizide, but move to breakfast and lunch - glipiZIDE (GLUCOTROL) 5 MG tablet; Take 1 tablet by mouth  twice a day before meals (breakfast and lunch)  Dispense: 180 tablet; Refill: 1 - CBC with Differential/Platelet - Hemoglobin A1c; Future - Comprehensive metabolic panel; Future  2. Left hip pain - advised to see chiropractor or  physical therapy to see if she can get some temporarily conservative relief for the next few mos b/c she does not want surgery or steroids at this point -would like her to be able to walk more so she can lose some weight and get her glucose, bp, and TG under better control - CBC with Differential/Platelet  3. Acquired leg length discrepancy -also may be helped with some manipulation at this point -does wear small amt of support in left shoe, but switches what she uses and pain comes and goes in left hip  4. Hypertriglyceridemia - she wants to hold off on adding zetia back until she has been off steroids for a full three mo period and able to lower these with dietary changes alone - Lipid panel; Future  5.  Leukocytosis:  Noted last month on labs--seems too far out from her steroid injection in march to explain it -will f/u cbc with diff today  Labs/tests ordered:   Orders Placed This Encounter  Procedures  . CBC with Differential/Platelet  . Hemoglobin A1c    Standing Status: Future     Number of Occurrences:      Standing Expiration Date: 08/06/2015  . Comprehensive metabolic panel    Standing Status: Future     Number of Occurrences:      Standing Expiration Date: 08/06/2015    Order Specific Question:  Has the patient fasted?    Answer:  Yes  . Lipid panel    Standing Status: Future     Number of Occurrences:      Standing Expiration Date: 08/06/2015    Order Specific Question:  Has the patient fasted?    Answer:  Yes    Next appt:  Mechell Girgis L. Cordero Surette, D.O. [facility] [address], [place] [address] Cell Phone (Mon-Fri 8am-5pm):  [phone] On Call:  [phone] & follow prompts after  5pm & weekends Office Phone:  [phone] Office Fax:  [phone]

## 2015-02-05 LAB — CBC WITH DIFFERENTIAL/PLATELET
Basophils Absolute: 0 10*3/uL (ref 0.0–0.2)
Basos: 0 %
EOS (ABSOLUTE): 0.2 10*3/uL (ref 0.0–0.4)
Eos: 2 %
Hematocrit: 36.6 % (ref 34.0–46.6)
Hemoglobin: 12.7 g/dL (ref 11.1–15.9)
Immature Grans (Abs): 0 10*3/uL (ref 0.0–0.1)
Immature Granulocytes: 0 %
Lymphocytes Absolute: 2.9 10*3/uL (ref 0.7–3.1)
Lymphs: 28 %
MCH: 31.8 pg (ref 26.6–33.0)
MCHC: 34.7 g/dL (ref 31.5–35.7)
MCV: 92 fL (ref 79–97)
Monocytes Absolute: 1.3 10*3/uL — ABNORMAL HIGH (ref 0.1–0.9)
Monocytes: 12 %
Neutrophils Absolute: 6 10*3/uL (ref 1.4–7.0)
Neutrophils: 58 %
Platelets: 257 10*3/uL (ref 150–379)
RBC: 4 x10E6/uL (ref 3.77–5.28)
RDW: 13.5 % (ref 12.3–15.4)
WBC: 10.3 10*3/uL (ref 3.4–10.8)

## 2015-03-03 ENCOUNTER — Ambulatory Visit (INDEPENDENT_AMBULATORY_CARE_PROVIDER_SITE_OTHER): Payer: Medicare Other | Admitting: Internal Medicine

## 2015-03-03 ENCOUNTER — Encounter: Payer: Self-pay | Admitting: Internal Medicine

## 2015-03-03 VITALS — BP 138/82 | HR 73 | Temp 98.5°F | Resp 20 | Ht 64.0 in | Wt 205.4 lb

## 2015-03-03 DIAGNOSIS — I10 Essential (primary) hypertension: Secondary | ICD-10-CM

## 2015-03-03 DIAGNOSIS — F411 Generalized anxiety disorder: Secondary | ICD-10-CM | POA: Diagnosis not present

## 2015-03-03 NOTE — Patient Instructions (Signed)
Use Azor 10/40 one tablet prior to dental procedures.

## 2015-03-03 NOTE — Progress Notes (Signed)
Patient ID: Julie Baxter, female   DOB: [birth year], 79 y.o.   MRN: [id number]    Facility  [facility]    Place of Service:   OFFICE    Allergies  Allergen Reactions  . Naproxen Swelling and Other (See Comments)    Blacked out   . Tramadol Nausea And Vomiting    Chief Complaint  Patient presents with  . Acute Visit    high BP @ dentist office    HPI:  Patient returns to office as a work in visit due to concerns about the elevation of her blood pressure at her dentist office. Pressures earlier today were 182/76 and then 177/90. She denies headache, visual disturbance, palpitations, dyspnea, or chest discomfort. She admits to anxiety regarding the dental procedure. Patient was previously here with elevated blood pressure when I last saw her and blamed it on concerns about her husband's health. She says that she is comfortable now with her husband's health and is no longer afraid that something bad would happen to him.  Medications: Patient's Medications  New Prescriptions   No medications on file  Previous Medications   AMBULATORY NON FORMULARY MEDICATION    Medication Name:T/S ACCU-CHK Smartview STP Use to check blood sugar twice daily. Dx 250.00   AMBULATORY NON FORMULARY MEDICATION    Fastclix Lancets Sig: Use to test blood sugar twice daily. DX: E11.65   ASPIRIN 81 MG TABLET    Take one tablet twice daily   ATENOLOL (TENORMIN) 50 MG TABLET    Take 1 and 1/2 tablets by  mouth twice daily   BUPROPION (WELLBUTRIN XL) 150 MG 24 HR TABLET    Take 1 tablet (150 mg total) by mouth daily.   FUROSEMIDE (LASIX) 20 MG TABLET    Take 1 tablet by mouth  daily for blood pressure  and edema.   GLIPIZIDE (GLUCOTROL) 5 MG TABLET    Take 1 tablet by mouth  twice a day before meals (breakfast and lunch)   LOSARTAN (COZAAR) 50 MG TABLET    Take one tablet twice daily to control blood pressure   METFORMIN (GLUCOPHAGE) 500 MG TABLET    Take 2 tablets by mouth two times daily with meals   METHYLCELLULOSE (ARTIFICIAL TEARS) 1 % OPHTHALMIC SOLUTION    Place 1 drop into both eyes 2 (two) times daily as needed (dry eyes).   OVER THE COUNTER MEDICATION    Take 3 tablets by mouth daily as needed. Restful legs   SIMVASTATIN (ZOCOR) 40 MG TABLET    Take 1 tablet (40 mg total) by mouth daily.  Modified Medications   No medications on file  Discontinued Medications   No medications on file     Review of Systems  Constitutional: Negative for fever.  HENT: Negative for congestion and hearing loss.   Eyes:       Follows with ophtho for retinopathy  Respiratory: Negative for shortness of breath.   Cardiovascular: Negative for chest pain.       Elevated blood pressures at the dentist's office. Dentist refused to do her dental procedure that was scheduled because of the elevation of the blood pressures.  Gastrointestinal: Negative for abdominal pain.  Genitourinary: Negative for dysuria.  Skin: Negative for rash.  Neurological: Negative for dizziness, weakness and headaches.  Hematological:       Diabetes  Psychiatric/Behavioral: The patient is nervous/anxious.        Improvement in caregiver stress due to husband's CHF    Filed Vitals:  03/03/15 1654  BP: 138/82  Pulse: 73  Temp: 98.5 F (36.9 C)  TempSrc: Oral  Resp: 20  Height: 5\' 4"  (1.626 m)  Weight: 205 lb 6.4 oz (93.169 kg)  SpO2: 96%   Body mass index is 35.24 kg/(m^2).  Physical Exam  Constitutional: She is oriented to person, place, and time. She appears well-developed and well-nourished. No distress.  Overweight white female  HENT:  Head: Normocephalic and atraumatic.  Right Ear: External ear normal.  Left Ear: External ear normal.  Nose: Nose normal.  Mouth/Throat: Oropharynx is clear and moist. No oropharyngeal exudate.  Eyes: Conjunctivae and EOM are normal. Pupils are equal, round, and reactive to light.  Neck: Normal range of motion. Neck supple. No JVD present. No thyromegaly present.  No carotid  bruits  Cardiovascular: Normal rate, regular rhythm, normal heart sounds and intact distal pulses.   Varicose veins  Pulmonary/Chest: Effort normal and breath sounds normal.  Abdominal: Soft. Bowel sounds are normal. She exhibits no distension and no mass. There is no tenderness.  Musculoskeletal: Normal range of motion. She exhibits edema and tenderness.  Of right greater than left knee  Lymphadenopathy:    She has no cervical adenopathy.  Neurological: She is alert and oriented to person, place, and time. She has normal reflexes.  Skin: Skin is warm and dry. No rash noted. No erythema.  Psychiatric: Judgment and thought content normal.  Increased anxiety.     Labs reviewed: Office Visit on 02/04/2015  Component Date Value Ref Range Status  . WBC 02/04/2015 10.3  3.4 - 10.8 x10E3/uL Final  . RBC 02/04/2015 4.00  3.77 - 5.28 x10E6/uL Final  . Hemoglobin 02/04/2015 12.7  11.1 - 15.9 g/dL Final  . Hematocrit 02/04/2015 36.6  34.0 - 46.6 % Final  . MCV 02/04/2015 92  79 - 97 fL Final  . MCH 02/04/2015 31.8  26.6 - 33.0 pg Final  . MCHC 02/04/2015 34.7  31.5 - 35.7 g/dL Final  . RDW 02/04/2015 13.5  12.3 - 15.4 % Final  . Platelets 02/04/2015 257  150 - 379 x10E3/uL Final  . NEUTROPHILS 02/04/2015 58   Final  . Lymphs 02/04/2015 28   Final  . Monocytes 02/04/2015 12   Final  . Eos 02/04/2015 2   Final  . Basos 02/04/2015 0   Final  . Neutrophils Absolute 02/04/2015 6.0  1.4 - 7.0 x10E3/uL Final  . Lymphocytes Absolute 02/04/2015 2.9  0.7 - 3.1 x10E3/uL Final  . Monocytes Absolute 02/04/2015 1.3* 0.1 - 0.9 x10E3/uL Final  . EOS (ABSOLUTE) 02/04/2015 0.2  0.0 - 0.4 x10E3/uL Final  . Basophils Absolute 02/04/2015 0.0  0.0 - 0.2 x10E3/uL Final  . Immature Granulocytes 02/04/2015 0   Final  . Immature Grans (Abs) 02/04/2015 0.0  0.0 - 0.1 x10E3/uL Final  Lab on 01/13/2015  Component Date Value Ref Range Status  . Glucose 01/13/2015 133* 65 - 99 mg/dL Final  . BUN 01/13/2015 16  8 -  27 mg/dL Final  . Creatinine, Ser 01/13/2015 1.02* 0.57 - 1.00 mg/dL Final  . GFR calc non Af Amer 01/13/2015 54* >59 mL/min/1.73 Final  . GFR calc Af Amer 01/13/2015 62  >59 mL/min/1.73 Final  . BUN/Creatinine Ratio 01/13/2015 16  11 - 26 Final  . Sodium 01/13/2015 141  134 - 144 mmol/L Final  . Potassium 01/13/2015 5.1  3.5 - 5.2 mmol/L Final  . Chloride 01/13/2015 101  97 - 108 mmol/L Final  . CO2 01/13/2015 25  18 - 29 mmol/L Final  . Calcium 01/13/2015 9.3  8.7 - 10.3 mg/dL Final  . Total Protein 01/13/2015 6.8  6.0 - 8.5 g/dL Final  . Albumin 01/13/2015 4.0  3.5 - 4.8 g/dL Final  . Globulin, Total 01/13/2015 2.8  1.5 - 4.5 g/dL Final  . Albumin/Globulin Ratio 01/13/2015 1.4  1.1 - 2.5 Final  . Bilirubin Total 01/13/2015 0.5  0.0 - 1.2 mg/dL Final  . Alkaline Phosphatase 01/13/2015 67  39 - 117 IU/L Final  . AST 01/13/2015 13  0 - 40 IU/L Final  . ALT 01/13/2015 12  0 - 32 IU/L Final  . Hgb A1c MFr Bld 01/13/2015 7.6* 4.8 - 5.6 % Final   Comment:          Pre-diabetes: 5.7 - 6.4          Diabetes: >6.4          Glycemic control for adults with diabetes: <7.0   . Est. average glucose Bld gHb Est-m* 01/13/2015 171   Final  . Cholesterol, Total 01/13/2015 143  100 - 199 mg/dL Final  . Triglycerides 01/13/2015 195* 0 - 149 mg/dL Final  . HDL 01/13/2015 48  >39 mg/dL Final   Comment: According to ATP-III Guidelines, HDL-C >59 mg/dL is considered a negative risk factor for CHD.   Marland Kitchen VLDL Cholesterol Cal 01/13/2015 39  5 - 40 mg/dL Final  . LDL Calculated 01/13/2015 56  0 - 99 mg/dL Final  . Chol/HDL Ratio 01/13/2015 3.0  0.0 - 4.4 ratio units Final   Comment:                                   T. Chol/HDL Ratio                                             Men  Women                               1/2 Avg.Risk  3.4    3.3                                   Avg.Risk  5.0    4.4                                2X Avg.Risk  9.6    7.1                                3X Avg.Risk 23.4    11.0   . WBC 01/13/2015 11.4* 3.4 - 10.8 x10E3/uL Final  . RBC 01/13/2015 4.08  3.77 - 5.28 x10E6/uL Final  . Hemoglobin 01/13/2015 12.7  11.1 - 15.9 g/dL Final  . Hematocrit 01/13/2015 38.4  34.0 - 46.6 % Final  . MCV 01/13/2015 94  79 - 97 fL Final  . MCH 01/13/2015 31.1  26.6 - 33.0 pg Final  . MCHC 01/13/2015 33.1  31.5 - 35.7 g/dL Final  . RDW 01/13/2015 14.2  12.3 - 15.4 % Final  . Platelets  01/13/2015 206  150 - 379 x10E3/uL Final  . NEUTROPHILS 01/13/2015 65   Final  . Lymphs 01/13/2015 21   Final  . Monocytes 01/13/2015 12   Final  . Eos 01/13/2015 2   Final  . Basos 01/13/2015 0   Final  . Neutrophils Absolute 01/13/2015 7.4* 1.4 - 7.0 x10E3/uL Final  . Lymphocytes Absolute 01/13/2015 2.4  0.7 - 3.1 x10E3/uL Final  . Monocytes Absolute 01/13/2015 1.4* 0.1 - 0.9 x10E3/uL Final  . EOS (ABSOLUTE) 01/13/2015 0.2  0.0 - 0.4 x10E3/uL Final  . Basophils Absolute 01/13/2015 0.0  0.0 - 0.2 x10E3/uL Final  . Immature Granulocytes 01/13/2015 0   Final  . Immature Grans (Abs) 01/13/2015 0.0  0.0 - 0.1 x10E3/uL Final     Assessment/Plan  1. Essential hypertension Patient was given a sample pack of AZOR 40/10. Advised her to take one of these tablets proximal one hour prior to her dental procedure. Patient says she may be referred to an oral surgeon by her usual dentist. My hope is that her anxiety has contributed to the elevated blood pressures prior to the procedure and that the sample medication noted above will be helpful.  2. Generalized anxiety disorder Continue current medications

## 2015-03-05 ENCOUNTER — Other Ambulatory Visit: Payer: Self-pay | Admitting: *Deleted

## 2015-03-05 DIAGNOSIS — F32A Depression, unspecified: Secondary | ICD-10-CM

## 2015-03-05 DIAGNOSIS — F329 Major depressive disorder, single episode, unspecified: Secondary | ICD-10-CM

## 2015-03-05 MED ORDER — BUPROPION HCL ER (XL) 150 MG PO TB24: 150.0000 mg | ORAL_TABLET | Freq: Every day | ORAL | Status: DC

## 2015-03-24 ENCOUNTER — Other Ambulatory Visit: Payer: Self-pay | Admitting: Internal Medicine

## 2015-04-23 ENCOUNTER — Other Ambulatory Visit: Payer: Medicare Other

## 2015-04-23 DIAGNOSIS — E781 Pure hyperglyceridemia: Secondary | ICD-10-CM

## 2015-04-23 DIAGNOSIS — E11319 Type 2 diabetes mellitus with unspecified diabetic retinopathy without macular edema: Secondary | ICD-10-CM | POA: Diagnosis not present

## 2015-04-24 LAB — COMPREHENSIVE METABOLIC PANEL
ALT: 10 IU/L (ref 0–32)
AST: 14 IU/L (ref 0–40)
Albumin/Globulin Ratio: 1.7 (ref 1.1–2.5)
Albumin: 4 g/dL (ref 3.5–4.8)
Alkaline Phosphatase: 67 IU/L (ref 39–117)
BUN/Creatinine Ratio: 16 (ref 11–26)
BUN: 19 mg/dL (ref 8–27)
Bilirubin Total: 0.5 mg/dL (ref 0.0–1.2)
CO2: 23 mmol/L (ref 18–29)
Calcium: 9.2 mg/dL (ref 8.7–10.3)
Chloride: 100 mmol/L (ref 97–108)
Creatinine, Ser: 1.2 mg/dL — ABNORMAL HIGH (ref 0.57–1.00)
GFR calc Af Amer: 50 mL/min/{1.73_m2} — ABNORMAL LOW (ref >59)
GFR calc non Af Amer: 44 mL/min/{1.73_m2} — ABNORMAL LOW (ref >59)
Globulin, Total: 2.4 g/dL (ref 1.5–4.5)
Glucose: 129 mg/dL — ABNORMAL HIGH (ref 65–99)
Potassium: 4.5 mmol/L (ref 3.5–5.2)
Sodium: 140 mmol/L (ref 134–144)
Total Protein: 6.4 g/dL (ref 6.0–8.5)

## 2015-04-24 LAB — LIPID PANEL
Chol/HDL Ratio: 3.1 ratio units (ref 0.0–4.4)
Cholesterol, Total: 144 mg/dL (ref 100–199)
HDL: 46 mg/dL (ref >39)
LDL Calculated: 65 mg/dL (ref 0–99)
Triglycerides: 166 mg/dL — ABNORMAL HIGH (ref 0–149)
VLDL Cholesterol Cal: 33 mg/dL (ref 5–40)

## 2015-04-24 LAB — HEMOGLOBIN A1C
Est. average glucose Bld gHb Est-mCnc: 154 mg/dL
Hgb A1c MFr Bld: 7 % — ABNORMAL HIGH (ref 4.8–5.6)

## 2015-04-29 ENCOUNTER — Encounter: Payer: Self-pay | Admitting: Internal Medicine

## 2015-04-29 ENCOUNTER — Ambulatory Visit (INDEPENDENT_AMBULATORY_CARE_PROVIDER_SITE_OTHER): Payer: Medicare Other | Admitting: Internal Medicine

## 2015-04-29 ENCOUNTER — Other Ambulatory Visit: Payer: Self-pay | Admitting: Internal Medicine

## 2015-04-29 VITALS — BP 110/78 | HR 66 | Temp 98.0°F | Resp 20 | Ht 64.0 in | Wt 205.0 lb

## 2015-04-29 DIAGNOSIS — F411 Generalized anxiety disorder: Secondary | ICD-10-CM | POA: Diagnosis not present

## 2015-04-29 DIAGNOSIS — N1831 Chronic kidney disease, stage 3a: Secondary | ICD-10-CM

## 2015-04-29 DIAGNOSIS — E11319 Type 2 diabetes mellitus with unspecified diabetic retinopathy without macular edema: Secondary | ICD-10-CM | POA: Diagnosis not present

## 2015-04-29 DIAGNOSIS — E669 Obesity, unspecified: Secondary | ICD-10-CM

## 2015-04-29 DIAGNOSIS — I10 Essential (primary) hypertension: Secondary | ICD-10-CM | POA: Diagnosis not present

## 2015-04-29 DIAGNOSIS — E781 Pure hyperglyceridemia: Secondary | ICD-10-CM | POA: Diagnosis not present

## 2015-04-29 DIAGNOSIS — M2170 Unequal limb length (acquired), unspecified site: Secondary | ICD-10-CM

## 2015-04-29 DIAGNOSIS — N183 Chronic kidney disease, stage 3 (moderate): Secondary | ICD-10-CM | POA: Diagnosis not present

## 2015-04-29 DIAGNOSIS — M25552 Pain in left hip: Secondary | ICD-10-CM

## 2015-04-29 NOTE — Progress Notes (Signed)
Patient ID: Julie Baxter, female   DOB: [birth year], 79 y.o.   MRN: [id number]   Location:  [facility]  Goals of Care: Advanced Directive information Does patient have an advance directive?: No, Would patient like information on creating an advanced directive?: Yes - Educational materials given  Need to discuss code status with her   Chief Complaint  Patient presents with  . Medical Management of Chronic Issues    6 month follow-up, Hypertension, DM    HPI: Patient is a 79 y.o. white female seen in the office today for med mgt of chronic diseases.  DMII:  Has stopped buying chips and salty foods like pork rinds.  Decreased carbs.  Granddaughter is adamant about children's food choices too.  Her brother almost had a fatal heart attack.  hba1c trended back down to 7 from 7.6.  Had a low the other night of 55.  Had milk to bring it up a little and apple juice.  Goes to bed really early and gets up really early.  Has retinopathy.    HTN:  bp great today.  Is watching her salt.    Left hip pain: Has pain in left groin that radiates around to the outside of her hip. Was limping walking in here.  Has her appt on 05/05/15 with orthopedics.   When pain was really bad, did take an advil here and there.  Neither advil or tylenol have been doing the hip much good right now.  Had no more than 10 advil in one week.   Hypertriglyceridemia:  LDL at goal but TG 166.    Weight is unchanged.    Discussed increasing her water intake.    Had right knee pain with burning and inflammation, and I'd given her a steroid injection.  Pain has never come back.  Review of Systems:  Review of Systems  Constitutional: Negative for fever, chills and malaise/fatigue.  HENT: Negative for congestion.   Eyes: Negative for blurred vision.       Retinopathy, follows with ophtho regularly  Respiratory: Negative for shortness of breath.   Cardiovascular: Positive for leg  swelling. Negative for chest pain.  Gastrointestinal: Negative for abdominal pain, constipation and blood in stool.  Genitourinary: Negative for dysuria, urgency and frequency.  Musculoskeletal: Positive for joint pain. Negative for falls.  Skin: Negative for itching and rash.  Neurological: Negative for dizziness, loss of consciousness and weakness.  Endo/Heme/Allergies: Bruises/bleeds easily.  Psychiatric/Behavioral: Negative for depression and memory loss. The patient is nervous/anxious. The patient does not have insomnia.     Past Medical History  Diagnosis Date  . Hypertension   . Anxiety     regarding surgery  . Diabetes mellitus without complication   . Peripheral vascular disease     varicose veins-  "Blood Clot anterior RIGHT LEG WITH PREGNANCY"  . Arthritis   . Pain in joint, pelvic region and thigh   . Hyperpotassemia   . Osteoarthrosis, unspecified whether generalized or localized, unspecified site   . Overweight(278.02)   . Diabetic retinopathy   . Chronic kidney disease   . Asymptomatic varicose veins   . Edema   . Type II or unspecified type diabetes mellitus with renal manifestations, not stated as uncontrolled   . Unspecified disorder of kidney and ureter   . Unspecified disorder of kidney and ureter   . Cough   . Pain in joint, lower leg   . Other specified disease of  nail   . Urinary tract infection, site not specified   . Type II or unspecified type diabetes mellitus without mention of complication, uncontrolled   . Other and unspecified hyperlipidemia   . Other abnormal blood chemistry   . Other malaise and fatigue   . Insomnia, unspecified   . Type II or unspecified type diabetes mellitus without mention of complication, not stated as uncontrolled   . Anxiety state, unspecified   . Undiagnosed cardiac murmurs     Past Surgical History  Procedure Laterality Date  . Abdominal hysterectomy    . Eye surgery Bilateral     cataract extraction with IOL    . Spinal meningitis  1957  . Tonsillectomy    . Lipoma excision      x2  bilateral buttocks  . Total hip arthroplasty Right 11/29/2012    Procedure: RIGHT TOTAL HIP ARTHROPLASTY ANTERIOR APPROACH;  Surgeon: Mcarthur Rossetti, MD;  Location: [facility];  Service: Orthopedics;  Laterality: Right;    Allergies  Allergen Reactions  . Naproxen Swelling and Other (See Comments)    Blacked out   . Tramadol Nausea And Vomiting   Medications: Patient's Medications  New Prescriptions   No medications on file  Previous Medications   ACCU-CHEK SMARTVIEW TEST STRIP    Use to check blood sugar  twice daily   AMBULATORY NON FORMULARY MEDICATION    Medication Name:T/S ACCU-CHK Smartview STP Use to check blood sugar twice daily. Dx 250.00   AMBULATORY NON FORMULARY MEDICATION    Fastclix Lancets Sig: Use to test blood sugar twice daily. DX: E11.65   ASPIRIN 81 MG TABLET    Take one tablet twice daily   ATENOLOL (TENORMIN) 50 MG TABLET    Take 1 and 1/2 tablets by  mouth twice daily   BUPROPION (WELLBUTRIN XL) 150 MG 24 HR TABLET    Take 1 tablet (150 mg total) by mouth daily.   FUROSEMIDE (LASIX) 20 MG TABLET    Take 1 tablet by mouth  daily for blood pressure  and edema.   GLIPIZIDE (GLUCOTROL) 5 MG TABLET    Take 1 tablet by mouth  twice a day before meals (breakfast and lunch)   LOSARTAN (COZAAR) 50 MG TABLET    Take one tablet twice daily to control blood pressure   METFORMIN (GLUCOPHAGE) 500 MG TABLET    Take 2 tablets by mouth two times daily with meals   METHYLCELLULOSE (ARTIFICIAL TEARS) 1 % OPHTHALMIC SOLUTION    Place 1 drop into both eyes 2 (two) times daily as needed (dry eyes).   OVER THE COUNTER MEDICATION    Take 3 tablets by mouth daily as needed. Restful legs   SIMVASTATIN (ZOCOR) 40 MG TABLET    Take 1 tablet (40 mg total) by mouth daily.  Modified Medications   No medications on file  Discontinued Medications   No medications on file    Physical Exam: Filed Vitals:    04/29/15 1322  BP: 110/78  Pulse: 66  Temp: 98 F (36.7 C)  TempSrc: Oral  Resp: 20  Height: 5\' 4"  (1.626 m)  Weight: 205 lb (92.987 kg)  SpO2: 97%   Physical Exam  Constitutional: She is oriented to person, place, and time. She appears well-developed and well-nourished.  Cardiovascular: Normal rate, regular rhythm, normal heart sounds and intact distal pulses.   Pulmonary/Chest: Effort normal and breath sounds normal. No respiratory distress.  Abdominal: Soft. Bowel sounds are normal. She exhibits no distension. There  is no tenderness.  Musculoskeletal: She exhibits tenderness.  Walking with limp due to left hip/groin pain  Neurological: She is alert and oriented to person, place, and time.  Skin: Skin is warm and dry.  Psychiatric:  anxious    Labs reviewed: Basic Metabolic Panel:  Recent Labs  06/19/14 0938 01/13/15 0834 04/23/15 0806  NA 143 141 140  K 5.2 5.1 4.5  CL 103 101 100  CO2 25 25 23   GLUCOSE 131* 133* 129*  BUN 16 16 19   CREATININE 1.07* 1.02* 1.20*  CALCIUM 9.5 9.3 9.2  TSH 1.970  --   --    Liver Function Tests:  Recent Labs  06/19/14 0938 01/13/15 0834 04/23/15 0806  AST 13 13 14   ALT 8 12 10   ALKPHOS 63 67 67  BILITOT 0.3 0.5 0.5  PROT 6.4 6.8 6.4   No results for input(s): LIPASE, AMYLASE in the last 8760 hours. No results for input(s): AMMONIA in the last 8760 hours. CBC:  Recent Labs  06/19/14 0938 01/13/15 0834 02/04/15 0825  WBC 7.3 11.4* 10.3  NEUTROABS 3.2 7.4* 6.0  HGB 12.1  --   --   HCT 36.3 38.4 36.6  MCV 91  --   --    Lipid Panel:  Recent Labs  06/19/14 0938 01/13/15 0834 04/23/15 0806  CHOL 155 143 144  HDL 48 48 46  LDLCALC 77 56 65  TRIG 149 195* 166*  CHOLHDL 3.2 3.0 3.1   Lab Results  Component Value Date   HGBA1C 7.0* 04/23/2015   Assessment/Plan 1. Essential hypertension -bp at goal with current therapy--losartan, lasix, tenormin  2. Type 2 diabetes mellitus with diabetic retinopathy,  macular edema presence unspecified, with unspecified retinopathy severity - hba1c has improved with some dietary changes -cont metformin as ordered (she has been adjusting this despite recommendations) and glipizide - CBC with Differential/Platelet; Future - Comprehensive metabolic panel; Future - Hemoglobin A1c; Future -Need last eye exam note  3. Left hip pain -keep appt with orthopedics -cont tylenol and occasional nsaids--advised to avoid the nsaids except on really bad days due to bleeding and renal risks -suspect she needs the hip replaced -need ortho notes--she will remind them to send them to me -she's wanting to put it off with another injection, but I would recommend she get it over with  4. Acquired leg length discrepancy -contributes to her chronic pain  5. Hypertriglyceridemia - cont dietary changes and f/u labs - Lipid panel; Future  6. Chronic kidney disease (CKD) stage G3a/A1, moderately decreased glomerular filtration rate (GFR) between 45-59 mL/min/1.73 square meter and albuminuria creatinine ratio less than 30 mg/g -avoid nsaids, other nephrotoxic meds, hydrate well, avoid sodas - CBC with Differential/Platelet; Future - Comprehensive metabolic panel; Future  7. Generalized anxiety disorder -continues on wellbutrin -seems like she is coping with her stress better these days  8. Obesity -needs to exercise, but hip pain too severe  Needs bone density next time--says she has too many other appts pending.     Labs/tests ordered:   Orders Placed This Encounter  Procedures  . CBC with Differential/Platelet    Standing Status: Future     Number of Occurrences:      Standing Expiration Date: 12/27/2015  . Comprehensive metabolic panel    Standing Status: Future     Number of Occurrences:      Standing Expiration Date: 12/27/2015    Order Specific Question:  Has the patient fasted?    Answer:  Yes  . Hemoglobin A1c    Standing Status: Future     Number of  Occurrences:      Standing Expiration Date: 12/27/2015  . Lipid panel    Standing Status: Future     Number of Occurrences:      Standing Expiration Date: 12/27/2015    Order Specific Question:  Has the patient fasted?    Answer:  Yes    Next appt:  4 mos with labs before  Kathleen Tamm L. Iyesha Such, D.O. [facility] [address], [place] [address] Cell Phone (Mon-Fri 8am-5pm):  [phone] On Call:  [phone] & follow prompts after 5pm & weekends Office Phone:  [phone] Office Fax:  [phone]

## 2015-04-29 NOTE — Patient Instructions (Signed)
Keep up the good work with your improved diet.

## 2015-05-05 DIAGNOSIS — M25552 Pain in left hip: Secondary | ICD-10-CM | POA: Diagnosis not present

## 2015-05-06 ENCOUNTER — Other Ambulatory Visit: Payer: Self-pay | Admitting: *Deleted

## 2015-05-06 MED ORDER — ATENOLOL 50 MG PO TABS: ORAL_TABLET | ORAL | Status: DC

## 2015-05-06 NOTE — Telephone Encounter (Signed)
[facility] 

## 2015-05-19 ENCOUNTER — Other Ambulatory Visit (HOSPITAL_COMMUNITY): Payer: Self-pay

## 2015-05-20 NOTE — Pre-Procedure Instructions (Signed)
Julie Baxter  05/20/2015      [id number] - [place], [place] [address] Phone: [phone] Fax: [phone]  [place] [place] [address] [place] [address] Phone: [phone] Fax: [phone]  [id number] - [place], [place] - [address] [place] [address] Phone: [phone] Fax: [phone]    Your procedure is scheduled on Tuesday, October 4th, 2016.   Report to [facility] Admitting at 1:30 PM.  Call this number if you have problems the morning of surgery:  [phone]   Remember:  Do not eat food or drink liquids after midnight.   Take these medicines the morning of surgery with A SIP OF WATER:  Acetaminophen (Tylenol) if needed, Atenolol (Tenormin), Bupropion (Wellbutrin) if needed, Eye drop if needed.  Stop taking the following 7 days prior to surgery: Aspirin, NSAIDS, Ibuprofen, Naproxen, Aleve, Motrin, Advil, BC's, Goody's, Fish Oil, all herbal medications, and all vitamins.    What do I do about my diabetes medications?   Do not take oral diabetes medicines (pills) the morning of surgery.     Do not wear jewelry, make-up or nail polish.  Do not wear lotions, powders, or perfumes.  You may wear deodorant.  Do not shave 48 hours prior to surgery.   Do not bring valuables to the hospital.  [facility] is not responsible for any belongings or valuables.  Contacts, dentures or bridgework may not be worn into surgery.  Leave your suitcase in the car.  After surgery it may be brought to your room.  For patients admitted to the hospital, discharge time will be determined by your treatment team.  Patients discharged the day of surgery will not be allowed to drive home.   Special instructions:  See attached.   Please read over the following fact sheets that you were given. Pain  Booklet, Coughing and Deep Breathing, Total Joint Packet, MRSA Information and Surgical Site Infection Prevention   How to Manage Your Diabetes Before Surgery   Why is it important to control my blood sugar before and after surgery?   Improving blood sugar levels before and after surgery helps healing and can limit problems.  A way of improving blood sugar control is eating a healthy diet by:  - Eating less sugar and carbohydrates  - Increasing activity/exercise  - Talk with your doctor about reaching your blood sugar goals  High blood sugars (greater than 180 mg/dL) can raise your risk of infections and slow down your recovery so you will need to focus on controlling your diabetes during the weeks before surgery.  Make sure that the doctor who takes care of your diabetes knows about your planned surgery including the date and location.  How do I manage my blood sugars before surgery?   Check your blood sugar at least 4 times a day, 2 days before surgery to make sure that they are not too high or low.   Check your blood sugar the morning of your surgery when you wake up and every 2               hours until you get to the Short-Stay unit.  If your blood sugar is less than 70 mg/dL, you will need to treat for low blood sugar by:  Treat a low blood sugar (less than 70  mg/dL) with 1/2 cup of clear juice (cranberry or apple), 4 glucose tablets, OR glucose gel.  Recheck blood sugar in 15 minutes after treatment (to make sure it is greater than 70 mg/dL).  If blood sugar is not greater than 70 mg/dL on re-check, call [phone] for further instructions.   Report your blood sugar to the Short-Stay nurse when you get to Short-Stay.  References:  [facility], 2007 "How to Manage your Diabetes Before and After Surgery".

## 2015-05-21 ENCOUNTER — Encounter (HOSPITAL_COMMUNITY): Payer: Self-pay

## 2015-05-21 ENCOUNTER — Encounter (HOSPITAL_COMMUNITY)
Admission: RE | Admit: 2015-05-21 | Discharge: 2015-05-21 | Disposition: A | Discharge status: Home / Self Care | Payer: Medicare Other | Source: Ambulatory Visit | Attending: Orthopaedic Surgery | Admitting: Orthopaedic Surgery

## 2015-05-21 DIAGNOSIS — R9431 Abnormal electrocardiogram [ECG] [EKG]: Secondary | ICD-10-CM | POA: Diagnosis not present

## 2015-05-21 DIAGNOSIS — Z7982 Long term (current) use of aspirin: Secondary | ICD-10-CM | POA: Diagnosis not present

## 2015-05-21 DIAGNOSIS — Z01818 Encounter for other preprocedural examination: Secondary | ICD-10-CM | POA: Diagnosis not present

## 2015-05-21 DIAGNOSIS — Z79899 Other long term (current) drug therapy: Secondary | ICD-10-CM | POA: Diagnosis not present

## 2015-05-21 DIAGNOSIS — N189 Chronic kidney disease, unspecified: Secondary | ICD-10-CM | POA: Diagnosis not present

## 2015-05-21 DIAGNOSIS — E785 Hyperlipidemia, unspecified: Secondary | ICD-10-CM | POA: Insufficient documentation

## 2015-05-21 DIAGNOSIS — I129 Hypertensive chronic kidney disease with stage 1 through stage 4 chronic kidney disease, or unspecified chronic kidney disease: Secondary | ICD-10-CM | POA: Diagnosis not present

## 2015-05-21 DIAGNOSIS — M1612 Unilateral primary osteoarthritis, left hip: Secondary | ICD-10-CM | POA: Insufficient documentation

## 2015-05-21 DIAGNOSIS — Z86718 Personal history of other venous thrombosis and embolism: Secondary | ICD-10-CM | POA: Insufficient documentation

## 2015-05-21 DIAGNOSIS — H9190 Unspecified hearing loss, unspecified ear: Secondary | ICD-10-CM | POA: Diagnosis not present

## 2015-05-21 DIAGNOSIS — Z01812 Encounter for preprocedural laboratory examination: Secondary | ICD-10-CM | POA: Diagnosis not present

## 2015-05-21 DIAGNOSIS — E1122 Type 2 diabetes mellitus with diabetic chronic kidney disease: Secondary | ICD-10-CM | POA: Insufficient documentation

## 2015-05-21 HISTORY — DX: Monoarthritis, not elsewhere classified, unspecified knee: M13.169

## 2015-05-21 HISTORY — DX: Unspecified hearing loss, unspecified ear: H91.90

## 2015-05-21 HISTORY — DX: Other specified postprocedural states: Z98.890

## 2015-05-21 HISTORY — DX: Nausea with vomiting, unspecified: R11.2

## 2015-05-21 HISTORY — DX: Nontoxic multinodular goiter: E04.2

## 2015-05-21 LAB — SURGICAL PCR SCREEN
MRSA, PCR: NEGATIVE
STAPHYLOCOCCUS AUREUS: POSITIVE — AB

## 2015-05-21 LAB — BASIC METABOLIC PANEL
Anion gap: 7 (ref 5–15)
BUN: 18 mg/dL (ref 6–20)
CALCIUM: 9.4 mg/dL (ref 8.9–10.3)
CO2: 27 mmol/L (ref 22–32)
CREATININE: 1.32 mg/dL — AB (ref 0.44–1.00)
Chloride: 106 mmol/L (ref 101–111)
GFR calc Af Amer: 44 mL/min — ABNORMAL LOW (ref >60)
GFR calc non Af Amer: 38 mL/min — ABNORMAL LOW (ref >60)
GLUCOSE: 111 mg/dL — AB (ref 65–99)
Potassium: 4.8 mmol/L (ref 3.5–5.1)
Sodium: 140 mmol/L (ref 135–145)

## 2015-05-21 LAB — CBC
HCT: 37.3 % (ref 36.0–46.0)
Hemoglobin: 12.3 g/dL (ref 12.0–15.0)
MCH: 30.7 pg (ref 26.0–34.0)
MCHC: 33 g/dL (ref 30.0–36.0)
MCV: 93 fL (ref 78.0–100.0)
PLATELETS: 220 10*3/uL (ref 150–400)
RBC: 4.01 MIL/uL (ref 3.87–5.11)
RDW: 12.9 % (ref 11.5–15.5)
WBC: 10.7 10*3/uL — ABNORMAL HIGH (ref 4.0–10.5)

## 2015-05-21 LAB — GLUCOSE, CAPILLARY: GLUCOSE-CAPILLARY: 141 mg/dL — AB (ref 65–99)

## 2015-05-21 NOTE — Progress Notes (Signed)
I called a prescription [facility] [address], [place], [place].

## 2015-05-21 NOTE — Progress Notes (Signed)
PCP - Dr. Hollace Kinnier Cardiologist - denies  EKG- 05/21/2015 - Epic CXR - denies  Echo/Stress Test/Cardiac Cath - pt. Denies  Patient denies chest pain and shortness of breath at PAT appointment.

## 2015-05-24 ENCOUNTER — Telehealth: Payer: Self-pay | Admitting: *Deleted

## 2015-05-24 ENCOUNTER — Encounter (HOSPITAL_COMMUNITY): Payer: Self-pay

## 2015-05-24 NOTE — Progress Notes (Signed)
Pt called to say she's concerned about her blood sugar increasing the DOS since her surgery is later and she is usually higher in the morning.  I instructed her to call us the day of surgery if her blood sugar is elevated and we can give insulin when she arrives/ instructions prior to her arrival according to anesthesia protocol.

## 2015-05-24 NOTE — Telephone Encounter (Signed)
Patient notified and very thankful

## 2015-05-24 NOTE — Telephone Encounter (Signed)
Noted.  Please call her back and let her know that I will be thinking of her, and I certainly hope all goes well.

## 2015-05-24 NOTE — Telephone Encounter (Signed)
Patient called and just wanted to keep you informed. Her Hip Surgery is going to be 06/01/2015 at 3:30pm with Dr. Ninfa Linden. She just called to keep you updated.

## 2015-05-24 NOTE — Progress Notes (Addendum)
Anesthesia Chart Review:  Pt is 79 year old female scheduled for L total hip arthroplasty anterior approach on 06/01/2015 with Dr. Kathrynn Speed.   PCP is Dr. Hollace Kinnier who is aware of pt's upcoming surgery.   PMH includes: HTN, DM with renal manifestations, hyperlipidemia, heart murmur, DVT (L leg during pregnancy), CKD. Hard of hearing. Never smoker. BMI 35. S/p R THA 11/29/12.   Medications include: ASA, atenolol, lasix, glipizide, losartan, metformin, simvastatin.   Preoperative labs reviewed.    EKG 05/21/2015: NSR. Low voltage QRS. Cannot rule out Anterior infarct, age undetermined. Appears unchanged when compared to 11/18/12 tracing.   Carotid doppler US 11/23/2014:  -Moderate bilateral carotid bifurcation plaque. No flow limiting stenosis. Degree of stenosis less than 50%. Similar findings on prior exam. Vertebrals are patent with antegrade flow.  If no changes, I anticipate pt can proceed with surgery as scheduled.   Willeen Cass, FNP-BC [facility]/Anesthesiology Phone: [phone] 05/24/2015 4:31 PM

## 2015-05-27 ENCOUNTER — Ambulatory Visit: Payer: Medicare Other | Admitting: Internal Medicine

## 2015-05-31 MED ORDER — CEFAZOLIN SODIUM-DEXTROSE 2-3 GM-% IV SOLR
2.0000 g | INTRAVENOUS | Status: AC
  Administered 2015-06-01: 2 g via INTRAVENOUS
  Filled 2015-05-31: qty 50

## 2015-05-31 MED ORDER — TRANEXAMIC ACID 1000 MG/10ML IV SOLN
1000.0000 mg | INTRAVENOUS | Status: AC
  Administered 2015-06-01: 1000 mg via INTRAVENOUS
  Filled 2015-05-31 (×2): qty 10

## 2015-05-31 NOTE — Progress Notes (Signed)
Message left regarding time change and to arrive at 1200 with other instructions remaining the same.

## 2015-05-31 NOTE — Progress Notes (Signed)
Patient instructed to arrive at 1200 pm 06-01-15.

## 2015-06-01 ENCOUNTER — Inpatient Hospital Stay (HOSPITAL_COMMUNITY)
Admission: RE | Admit: 2015-06-01 | Discharge: 2015-06-03 | DRG: 470 | Disposition: A | Discharge status: Home Health | Payer: Medicare Other | Source: Ambulatory Visit | Attending: Orthopaedic Surgery | Admitting: Orthopaedic Surgery

## 2015-06-01 ENCOUNTER — Inpatient Hospital Stay (HOSPITAL_COMMUNITY): Payer: Medicare Other | Admitting: Anesthesiology

## 2015-06-01 ENCOUNTER — Inpatient Hospital Stay (HOSPITAL_COMMUNITY): Payer: Medicare Other | Admitting: Emergency Medicine

## 2015-06-01 ENCOUNTER — Inpatient Hospital Stay (HOSPITAL_COMMUNITY): Payer: Medicare Other

## 2015-06-01 ENCOUNTER — Encounter (HOSPITAL_COMMUNITY)
Admission: RE | Disposition: A | Discharge status: Home Health | Payer: Self-pay | Source: Ambulatory Visit | Attending: Orthopaedic Surgery

## 2015-06-01 ENCOUNTER — Encounter (HOSPITAL_COMMUNITY): Payer: Self-pay | Admitting: *Deleted

## 2015-06-01 DIAGNOSIS — I129 Hypertensive chronic kidney disease with stage 1 through stage 4 chronic kidney disease, or unspecified chronic kidney disease: Secondary | ICD-10-CM | POA: Diagnosis not present

## 2015-06-01 DIAGNOSIS — N189 Chronic kidney disease, unspecified: Secondary | ICD-10-CM | POA: Diagnosis not present

## 2015-06-01 DIAGNOSIS — E1122 Type 2 diabetes mellitus with diabetic chronic kidney disease: Secondary | ICD-10-CM | POA: Diagnosis present

## 2015-06-01 DIAGNOSIS — M1612 Unilateral primary osteoarthritis, left hip: Secondary | ICD-10-CM | POA: Diagnosis not present

## 2015-06-01 DIAGNOSIS — E11319 Type 2 diabetes mellitus with unspecified diabetic retinopathy without macular edema: Secondary | ICD-10-CM | POA: Diagnosis present

## 2015-06-01 DIAGNOSIS — Z96642 Presence of left artificial hip joint: Secondary | ICD-10-CM

## 2015-06-01 DIAGNOSIS — Z9889 Other specified postprocedural states: Secondary | ICD-10-CM | POA: Diagnosis not present

## 2015-06-01 DIAGNOSIS — E663 Overweight: Secondary | ICD-10-CM | POA: Diagnosis not present

## 2015-06-01 DIAGNOSIS — F411 Generalized anxiety disorder: Secondary | ICD-10-CM | POA: Diagnosis present

## 2015-06-01 DIAGNOSIS — Z6835 Body mass index (BMI) 35.0-35.9, adult: Secondary | ICD-10-CM

## 2015-06-01 DIAGNOSIS — I739 Peripheral vascular disease, unspecified: Secondary | ICD-10-CM | POA: Diagnosis present

## 2015-06-01 DIAGNOSIS — E785 Hyperlipidemia, unspecified: Secondary | ICD-10-CM | POA: Diagnosis present

## 2015-06-01 DIAGNOSIS — M25552 Pain in left hip: Secondary | ICD-10-CM | POA: Diagnosis present

## 2015-06-01 DIAGNOSIS — Z96641 Presence of right artificial hip joint: Secondary | ICD-10-CM | POA: Diagnosis present

## 2015-06-01 DIAGNOSIS — Z419 Encounter for procedure for purposes other than remedying health state, unspecified: Secondary | ICD-10-CM

## 2015-06-01 DIAGNOSIS — M169 Osteoarthritis of hip, unspecified: Secondary | ICD-10-CM | POA: Diagnosis not present

## 2015-06-01 HISTORY — PX: TOTAL HIP ARTHROPLASTY: SHX124

## 2015-06-01 LAB — GLUCOSE, CAPILLARY
GLUCOSE-CAPILLARY: 101 mg/dL — AB (ref 65–99)
GLUCOSE-CAPILLARY: 177 mg/dL — AB (ref 65–99)
Glucose-Capillary: 127 mg/dL — ABNORMAL HIGH (ref 65–99)
Glucose-Capillary: 113 mg/dL — ABNORMAL HIGH (ref 65–99)

## 2015-06-01 SURGERY — ARTHROPLASTY, HIP, TOTAL, ANTERIOR APPROACH
Anesthesia: Monitor Anesthesia Care | Site: Hip | Laterality: Left

## 2015-06-01 MED ORDER — INSULIN ASPART 100 UNIT/ML ~~LOC~~ SOLN
0.0000 [IU] | Freq: Three times a day (TID) | SUBCUTANEOUS | Status: DC
  Administered 2015-06-02: 5 [IU] via SUBCUTANEOUS
  Administered 2015-06-02: 3 [IU] via SUBCUTANEOUS
  Administered 2015-06-03: 2 [IU] via SUBCUTANEOUS
  Administered 2015-06-03: 5 [IU] via SUBCUTANEOUS

## 2015-06-01 MED ORDER — HYDROMORPHONE HCL 1 MG/ML IJ SOLN: 0.2500 mg | INTRAMUSCULAR | Status: DC | PRN

## 2015-06-01 MED ORDER — BUPIVACAINE HCL (PF) 0.5 % IJ SOLN
INTRAMUSCULAR | Status: DC | PRN
  Administered 2015-06-01: 3 mL via INTRATHECAL

## 2015-06-01 MED ORDER — MEPERIDINE HCL 25 MG/ML IJ SOLN
INTRAMUSCULAR | Status: AC
  Filled 2015-06-01: qty 1

## 2015-06-01 MED ORDER — ALUM & MAG HYDROXIDE-SIMETH 200-200-20 MG/5ML PO SUSP: 30.0000 mL | ORAL | Status: DC | PRN

## 2015-06-01 MED ORDER — SIMVASTATIN 40 MG PO TABS
40.0000 mg | ORAL_TABLET | Freq: Every day | ORAL | Status: DC
Start: 2015-06-01 — End: 2015-06-03
  Administered 2015-06-01 – 2015-06-03 (×3): 40 mg via ORAL
  Filled 2015-06-01 (×5): qty 1

## 2015-06-01 MED ORDER — ZOLPIDEM TARTRATE 5 MG PO TABS
5.0000 mg | ORAL_TABLET | Freq: Every evening | ORAL | Status: DC | PRN
  Administered 2015-06-02: 5 mg via ORAL
  Filled 2015-06-01: qty 1

## 2015-06-01 MED ORDER — ONDANSETRON HCL 4 MG/2ML IJ SOLN: 4.0000 mg | Freq: Four times a day (QID) | INTRAMUSCULAR | Status: DC | PRN

## 2015-06-01 MED ORDER — METOCLOPRAMIDE HCL 5 MG/ML IJ SOLN: 5.0000 mg | Freq: Three times a day (TID) | INTRAMUSCULAR | Status: DC | PRN

## 2015-06-01 MED ORDER — INSULIN ASPART 100 UNIT/ML ~~LOC~~ SOLN
0.0000 [IU] | Freq: Every day | SUBCUTANEOUS | Status: DC
Start: 2015-06-01 — End: 2015-06-03

## 2015-06-01 MED ORDER — DOCUSATE SODIUM 100 MG PO CAPS
100.0000 mg | ORAL_CAPSULE | Freq: Two times a day (BID) | ORAL | Status: DC
  Administered 2015-06-01 – 2015-06-03 (×4): 100 mg via ORAL
  Filled 2015-06-01 (×4): qty 1

## 2015-06-01 MED ORDER — PHENYLEPHRINE HCL 10 MG/ML IJ SOLN
INTRAMUSCULAR | Status: DC | PRN
  Administered 2015-06-01: 80 ug via INTRAVENOUS
  Administered 2015-06-01: 40 ug via INTRAVENOUS

## 2015-06-01 MED ORDER — EPHEDRINE SULFATE 50 MG/ML IJ SOLN
INTRAMUSCULAR | Status: DC | PRN
  Administered 2015-06-01: 5 mg via INTRAVENOUS

## 2015-06-01 MED ORDER — LACTATED RINGERS IV SOLN
INTRAVENOUS | Status: DC
  Administered 2015-06-01 (×2): via INTRAVENOUS

## 2015-06-01 MED ORDER — PHENOL 1.4 % MT LIQD: 1.0000 | OROMUCOSAL | Status: DC | PRN

## 2015-06-01 MED ORDER — MIDAZOLAM HCL 2 MG/2ML IJ SOLN
INTRAMUSCULAR | Status: AC
  Filled 2015-06-01: qty 4

## 2015-06-01 MED ORDER — FENTANYL CITRATE (PF) 100 MCG/2ML IJ SOLN
INTRAMUSCULAR | Status: DC | PRN
  Administered 2015-06-01 (×3): 50 ug via INTRAVENOUS

## 2015-06-01 MED ORDER — ONDANSETRON HCL 4 MG PO TABS: 4.0000 mg | ORAL_TABLET | Freq: Four times a day (QID) | ORAL | Status: DC | PRN

## 2015-06-01 MED ORDER — LOSARTAN POTASSIUM 50 MG PO TABS
50.0000 mg | ORAL_TABLET | Freq: Two times a day (BID) | ORAL | Status: DC
  Administered 2015-06-01 – 2015-06-03 (×4): 50 mg via ORAL
  Filled 2015-06-01 (×4): qty 1

## 2015-06-01 MED ORDER — ACETAMINOPHEN 325 MG PO TABS
650.0000 mg | ORAL_TABLET | Freq: Four times a day (QID) | ORAL | Status: DC | PRN
  Administered 2015-06-02: 650 mg via ORAL
  Filled 2015-06-01: qty 2

## 2015-06-01 MED ORDER — METHOCARBAMOL 500 MG PO TABS: 500.0000 mg | ORAL_TABLET | Freq: Four times a day (QID) | ORAL | Status: DC | PRN

## 2015-06-01 MED ORDER — PROPOFOL 10 MG/ML IV BOLUS
INTRAVENOUS | Status: AC
  Filled 2015-06-01: qty 20

## 2015-06-01 MED ORDER — SODIUM CHLORIDE 0.9 % IV SOLN
INTRAVENOUS | Status: DC
  Administered 2015-06-02: 11:00:00 via INTRAVENOUS

## 2015-06-01 MED ORDER — METOCLOPRAMIDE HCL 5 MG PO TABS: 5.0000 mg | ORAL_TABLET | Freq: Three times a day (TID) | ORAL | Status: DC | PRN

## 2015-06-01 MED ORDER — GLIPIZIDE 5 MG PO TABS
5.0000 mg | ORAL_TABLET | Freq: Every day | ORAL | Status: DC
  Administered 2015-06-02 – 2015-06-03 (×2): 5 mg via ORAL
  Filled 2015-06-01 (×2): qty 1

## 2015-06-01 MED ORDER — ASPIRIN EC 325 MG PO TBEC
325.0000 mg | DELAYED_RELEASE_TABLET | Freq: Every day | ORAL | Status: DC
  Administered 2015-06-02 – 2015-06-03 (×2): 325 mg via ORAL
  Filled 2015-06-01 (×2): qty 1

## 2015-06-01 MED ORDER — 0.9 % SODIUM CHLORIDE (POUR BTL) OPTIME
TOPICAL | Status: DC | PRN
  Administered 2015-06-01: 1000 mL

## 2015-06-01 MED ORDER — FUROSEMIDE 20 MG PO TABS
20.0000 mg | ORAL_TABLET | Freq: Every day | ORAL | Status: DC
  Administered 2015-06-01 – 2015-06-02 (×2): 20 mg via ORAL
  Filled 2015-06-01 (×3): qty 1

## 2015-06-01 MED ORDER — PROPOFOL 500 MG/50ML IV EMUL
INTRAVENOUS | Status: DC | PRN
  Administered 2015-06-01: 30 ug/kg/min via INTRAVENOUS

## 2015-06-01 MED ORDER — SODIUM CHLORIDE 0.9 % IR SOLN
Status: DC | PRN
  Administered 2015-06-01: 3000 mL

## 2015-06-01 MED ORDER — MENTHOL 3 MG MT LOZG: 1.0000 | LOZENGE | OROMUCOSAL | Status: DC | PRN

## 2015-06-01 MED ORDER — HYDROMORPHONE HCL 1 MG/ML IJ SOLN
0.5000 mg | INTRAMUSCULAR | Status: DC | PRN
  Administered 2015-06-01: 0.5 mg via INTRAVENOUS
  Filled 2015-06-01: qty 1

## 2015-06-01 MED ORDER — FENTANYL CITRATE (PF) 250 MCG/5ML IJ SOLN
INTRAMUSCULAR | Status: AC
  Filled 2015-06-01: qty 5

## 2015-06-01 MED ORDER — POLYETHYLENE GLYCOL 3350 17 G PO PACK: 17.0000 g | PACK | Freq: Every day | ORAL | Status: DC | PRN

## 2015-06-01 MED ORDER — ACETAMINOPHEN 650 MG RE SUPP: 650.0000 mg | Freq: Four times a day (QID) | RECTAL | Status: DC | PRN

## 2015-06-01 MED ORDER — MEPERIDINE HCL 25 MG/ML IJ SOLN
6.2500 mg | INTRAMUSCULAR | Status: DC | PRN
  Administered 2015-06-01: 6.25 mg via INTRAVENOUS

## 2015-06-01 MED ORDER — PROMETHAZINE HCL 25 MG/ML IJ SOLN: 6.2500 mg | INTRAMUSCULAR | Status: DC | PRN

## 2015-06-01 MED ORDER — METFORMIN HCL 500 MG PO TABS
500.0000 mg | ORAL_TABLET | Freq: Every day | ORAL | Status: DC
  Administered 2015-06-02 – 2015-06-03 (×2): 500 mg via ORAL
  Filled 2015-06-01 (×2): qty 1

## 2015-06-01 MED ORDER — PHENYLEPHRINE HCL 10 MG/ML IJ SOLN
10.0000 mg | INTRAMUSCULAR | Status: DC | PRN
  Administered 2015-06-01: 10 ug/min via INTRAVENOUS

## 2015-06-01 MED ORDER — DEXTROSE 5 % IV SOLN
500.0000 mg | Freq: Four times a day (QID) | INTRAVENOUS | Status: DC | PRN
  Filled 2015-06-01: qty 5

## 2015-06-01 MED ORDER — ATENOLOL 50 MG PO TABS
50.0000 mg | ORAL_TABLET | Freq: Every day | ORAL | Status: DC
  Administered 2015-06-02 – 2015-06-03 (×2): 50 mg via ORAL
  Filled 2015-06-01 (×2): qty 1

## 2015-06-01 MED ORDER — MIDAZOLAM HCL 5 MG/5ML IJ SOLN
INTRAMUSCULAR | Status: DC | PRN
  Administered 2015-06-01: 2 mg via INTRAVENOUS

## 2015-06-01 MED ORDER — OXYCODONE HCL 5 MG PO TABS
5.0000 mg | ORAL_TABLET | ORAL | Status: DC | PRN
  Administered 2015-06-01: 5 mg via ORAL
  Administered 2015-06-02: 10 mg via ORAL
  Administered 2015-06-02 – 2015-06-03 (×4): 5 mg via ORAL
  Filled 2015-06-01 (×7): qty 1

## 2015-06-01 MED ORDER — DIPHENHYDRAMINE HCL 12.5 MG/5ML PO ELIX: 12.5000 mg | ORAL_SOLUTION | ORAL | Status: DC | PRN

## 2015-06-01 MED ORDER — CEFAZOLIN SODIUM 1-5 GM-% IV SOLN
1.0000 g | Freq: Four times a day (QID) | INTRAVENOUS | Status: AC
  Administered 2015-06-01 – 2015-06-02 (×2): 1 g via INTRAVENOUS
  Filled 2015-06-01 (×2): qty 50

## 2015-06-01 SURGICAL SUPPLY — 55 items
APL SKNCLS STERI-STRIP NONHPOA (GAUZE/BANDAGES/DRESSINGS) ×1
BENZOIN TINCTURE PRP APPL 2/3 (GAUZE/BANDAGES/DRESSINGS) ×3 IMPLANT
BLADE SAW SGTL 18X1.27X75 (BLADE) ×2 IMPLANT
BLADE SAW SGTL 18X1.27X75MM (BLADE) ×1
BLADE SURG ROTATE 9660 (MISCELLANEOUS) IMPLANT
CAPT HIP TOTAL 2 ×2 IMPLANT
CELLS DAT CNTRL 66122 CELL SVR (MISCELLANEOUS) ×1 IMPLANT
CLOSURE WOUND 1/2 X4 (GAUZE/BANDAGES/DRESSINGS) ×1
COVER SURGICAL LIGHT HANDLE (MISCELLANEOUS) ×3 IMPLANT
DRAPE C-ARM 42X72 X-RAY (DRAPES) ×3 IMPLANT
DRAPE STERI IOBAN 125X83 (DRAPES) ×3 IMPLANT
DRAPE U-SHAPE 47X51 STRL (DRAPES) ×9 IMPLANT
DRSG AQUACEL AG ADV 3.5X10 (GAUZE/BANDAGES/DRESSINGS) ×3 IMPLANT
DURAPREP 26ML APPLICATOR (WOUND CARE) ×3 IMPLANT
ELECT BLADE 4.0 EZ CLEAN MEGAD (MISCELLANEOUS) ×3
ELECT BLADE 6.5 EXT (BLADE) IMPLANT
ELECT REM PT RETURN 9FT ADLT (ELECTROSURGICAL) ×3
ELECTRODE BLDE 4.0 EZ CLN MEGD (MISCELLANEOUS) ×1 IMPLANT
ELECTRODE REM PT RTRN 9FT ADLT (ELECTROSURGICAL) ×1 IMPLANT
FACESHIELD WRAPAROUND (MASK) ×6 IMPLANT
FACESHIELD WRAPAROUND OR TEAM (MASK) ×2 IMPLANT
GLOVE BIOGEL PI IND STRL 8 (GLOVE) ×2 IMPLANT
GLOVE BIOGEL PI INDICATOR 8 (GLOVE) ×4
GLOVE ECLIPSE 8.0 STRL XLNG CF (GLOVE) ×3 IMPLANT
GLOVE ORTHO TXT STRL SZ7.5 (GLOVE) ×6 IMPLANT
GOWN STRL REUS W/ TWL LRG LVL3 (GOWN DISPOSABLE) ×2 IMPLANT
GOWN STRL REUS W/ TWL XL LVL3 (GOWN DISPOSABLE) ×2 IMPLANT
GOWN STRL REUS W/TWL LRG LVL3 (GOWN DISPOSABLE) ×6
GOWN STRL REUS W/TWL XL LVL3 (GOWN DISPOSABLE) ×6
HANDPIECE INTERPULSE COAX TIP (DISPOSABLE) ×3
KIT BASIN OR (CUSTOM PROCEDURE TRAY) ×3 IMPLANT
KIT ROOM TURNOVER OR (KITS) ×3 IMPLANT
MANIFOLD NEPTUNE II (INSTRUMENTS) ×3 IMPLANT
NS IRRIG 1000ML POUR BTL (IV SOLUTION) ×3 IMPLANT
PACK TOTAL JOINT (CUSTOM PROCEDURE TRAY) ×3 IMPLANT
PACK UNIVERSAL I (CUSTOM PROCEDURE TRAY) ×3 IMPLANT
PAD ARMBOARD 7.5X6 YLW CONV (MISCELLANEOUS) ×3 IMPLANT
RETRACTOR WND ALEXIS 18 MED (MISCELLANEOUS) ×1 IMPLANT
RTRCTR WOUND ALEXIS 18CM MED (MISCELLANEOUS) ×3
SET HNDPC FAN SPRY TIP SCT (DISPOSABLE) ×1 IMPLANT
STAPLER VISISTAT 35W (STAPLE) IMPLANT
STRIP CLOSURE SKIN 1/2X4 (GAUZE/BANDAGES/DRESSINGS) ×3 IMPLANT
SUT ETHIBOND NAB CT1 #1 30IN (SUTURE) ×5 IMPLANT
SUT ETHILON 3 0 PS 1 (SUTURE) ×2 IMPLANT
SUT MNCRL AB 4-0 PS2 18 (SUTURE) ×2 IMPLANT
SUT VIC AB 0 CT1 27 (SUTURE) ×3
SUT VIC AB 0 CT1 27XBRD ANBCTR (SUTURE) ×1 IMPLANT
SUT VIC AB 1 CT1 27 (SUTURE) ×6
SUT VIC AB 1 CT1 27XBRD ANBCTR (SUTURE) ×2 IMPLANT
SUT VIC AB 2-0 CT1 27 (SUTURE) ×6
SUT VIC AB 2-0 CT1 TAPERPNT 27 (SUTURE) ×1 IMPLANT
TOWEL OR 17X24 6PK STRL BLUE (TOWEL DISPOSABLE) ×3 IMPLANT
TOWEL OR 17X26 10 PK STRL BLUE (TOWEL DISPOSABLE) ×3 IMPLANT
TRAY FOLEY CATH 16FRSI W/METER (SET/KITS/TRAYS/PACK) ×2 IMPLANT
WATER STERILE IRR 1000ML POUR (IV SOLUTION) ×2 IMPLANT

## 2015-06-01 NOTE — Anesthesia Procedure Notes (Signed)
Spinal Patient location during procedure: OR Staffing Anesthesiologist: Ripley Bogosian Performed by: anesthesiologist  Preanesthetic Checklist Completed: patient identified, site marked, surgical consent, pre-op evaluation, timeout performed, IV checked, risks and benefits discussed and monitors and equipment checked Spinal Block Patient position: sitting Prep: ChloraPrep Patient monitoring: heart rate, continuous pulse ox and blood pressure Approach: right paramedian Location: L2-3 Injection technique: single-shot Needle Needle type: Sprotte  Needle gauge: 24 G Needle length: 9 cm Additional Notes Expiration date of kit checked and confirmed. Patient tolerated procedure well, without complications.     

## 2015-06-01 NOTE — Transfer of Care (Signed)
Immediate Anesthesia Transfer of Care Note  Patient: Tenille S Michelotti  Procedure(s) Performed: Procedure(s): LEFT TOTAL HIP ARTHROPLASTY ANTERIOR APPROACH (Left)  Patient Location: PACU  Anesthesia Type:MAC and Spinal  Level of Consciousness: awake, alert , oriented and patient cooperative  Airway & Oxygen Therapy: Patient Spontanous Breathing  Post-op Assessment: Report given to RN, Post -op Vital signs reviewed and stable, Patient moving all extremities and Patient moving all extremities X 4  Pt BP 85/57 on arrival to PACU.  But pt very alert talking, mentating, oriented.  5mg  ephedrine adn 81mcg phenylephrine given.  BP 102/68.    Post vital signs: Reviewed and stable  Last Vitals:  Filed Vitals:   06/01/15 1736  BP: 76/37  Pulse: 62  Temp:   Resp: 15    Complications: No apparent anesthesia complications

## 2015-06-01 NOTE — Anesthesia Postprocedure Evaluation (Signed)
  Anesthesia Post-op Note  Patient: Julie Baxter  Procedure(s) Performed: Procedure(s): LEFT TOTAL HIP ARTHROPLASTY ANTERIOR APPROACH (Left)  Patient Location: PACU  Anesthesia Type:Spinal  Level of Consciousness: awake, alert , oriented and patient cooperative  Airway and Oxygen Therapy: Patient Spontanous Breathing  Post-op Pain: none  Post-op Assessment: Post-op Vital signs reviewed, Patient's Cardiovascular Status Stable, Respiratory Function Stable, Patent Airway, No signs of Nausea or vomiting, Pain level controlled, No headache, No backache, Spinal receding and Patient able to bend at knees LLE Motor Response: No movement due to regional block   RLE Motor Response: No movement due to regional block   L Sensory Level: L2-Upper inner thigh, upper buttock (dull) R Sensory Level: T10-Umbilical region  Post-op Vital Signs: Reviewed and stable  Last Vitals:  Filed Vitals:   06/01/15 1815  BP: 108/53  Pulse: 62  Temp:   Resp: 11    Complications: No apparent anesthesia complications

## 2015-06-01 NOTE — Anesthesia Preprocedure Evaluation (Signed)
Anesthesia Evaluation  Patient identified by MRN, date of birth, ID band Patient awake    Reviewed: Allergy & Precautions, H&P , NPO status , Patient's Chart, lab work & pertinent test results  History of Anesthesia Complications (+) PONV and history of anesthetic complications  Airway Mallampati: III  TM Distance: >3 FB Neck ROM: Full    Dental  (+) Edentulous Upper, Partial Lower   Pulmonary neg pulmonary ROS,    Pulmonary exam normal breath sounds clear to auscultation       Cardiovascular hypertension, Pt. on medications and Pt. on home beta blockers + Peripheral Vascular Disease  Normal cardiovascular exam+ Valvular Problems/Murmurs  Rhythm:Regular Rate:Normal     Neuro/Psych PSYCHIATRIC DISORDERS Anxiety negative neurological ROS     GI/Hepatic negative GI ROS, Neg liver ROS,   Endo/Other  diabetes, Type 2, Oral Hypoglycemic Agents  Renal/GU Renal disease  negative genitourinary   Musculoskeletal  (+) Arthritis ,   Abdominal   Peds  Hematology negative hematology ROS (+)   Anesthesia Other Findings   Reproductive/Obstetrics                             Anesthesia Physical  Anesthesia Plan  ASA: II  Anesthesia Plan:    Post-op Pain Management:    Induction:   Airway Management Planned:   Additional Equipment:   Intra-op Plan:   Post-operative Plan:   Informed Consent: I have reviewed the patients History and Physical, chart, labs and discussed the procedure including the risks, benefits and alternatives for the proposed anesthesia with the patient or authorized representative who has indicated his/her understanding and acceptance.   Dental advisory given  Plan Discussed with: CRNA  Anesthesia Plan Comments:         Anesthesia Quick Evaluation

## 2015-06-01 NOTE — H&P (Signed)
TOTAL HIP ADMISSION H&P  Patient is admitted for left total hip arthroplasty.  Subjective:  Chief Complaint: left hip pain  HPI: [place], 79 y.o. female, has a history of pain and functional disability in the left hip(s) due to arthritis and patient has failed non-surgical conservative treatments for greater than 12 weeks to include NSAID's and/or analgesics, corticosteriod injections, flexibility and strengthening excercises, supervised PT with diminished ADL's post treatment, use of assistive devices and activity modification.  Onset of symptoms was gradual starting 1 years ago with gradually worsening course since that time.The patient noted no past surgery on the left hip(s).  Patient currently rates pain in the left hip at 9 out of 10 with activity. Patient has night pain, worsening of pain with activity and weight bearing, pain that interfers with activities of daily living and pain with passive range of motion. Patient has evidence of subchondral cysts, subchondral sclerosis, periarticular osteophytes and joint space narrowing by imaging studies. This condition presents safety issues increasing the risk of falls.  There is no current active infection.  Patient Active Problem List   Diagnosis Date Noted  . Osteoarthritis of left hip 06/01/2015  . Dizzy spells 12/09/2014  . Cystic thyroid nodule 12/09/2014  . Generalized anxiety disorder 11/25/2014  . Hypertension   . Diabetes mellitus without complication ([place])   . Diabetic retinopathy ([place])   . Other and unspecified hyperlipidemia   . Degenerative arthritis of hip 11/29/2012   Past Medical History  Diagnosis Date  . Hypertension   . Arthritis   . Pain in joint, pelvic region and thigh   . Hyperpotassemia   . Osteoarthrosis, unspecified whether generalized or localized, unspecified site   . Overweight(278.02)   . Diabetic retinopathy   . Asymptomatic varicose veins   . Edema   . Unspecified disorder of kidney and  ureter   . Unspecified disorder of kidney and ureter   . Pain in joint, lower leg   . Other specified disease of nail   . Urinary tract infection, site not specified   . Other and unspecified hyperlipidemia   . Other abnormal blood chemistry   . Other malaise and fatigue   . Undiagnosed cardiac murmurs   . PONV (postoperative nausea and vomiting)     pt. states that she was sick after hip surgery and oral surgery  . Peripheral vascular disease     varicose veins-  "Blood Clot anterior left LEG WITH PREGNANCY"  . Chronic kidney disease   . Anxiety     regarding surgery; occassionally pt. states  . Anxiety state, unspecified   . Insomnia, unspecified   . Multiple thyroid nodules   . Inflammation of joint of knee     right knee  . Hard of hearing   . Type II or unspecified type diabetes mellitus with renal manifestations, not stated as uncontrolled     Past Surgical History  Procedure Laterality Date  . Abdominal hysterectomy    . Spinal meningitis  1957  . Tonsillectomy    . Lipoma excision      x2  bilateral buttocks  . Total hip arthroplasty Right 11/29/2012    Procedure: RIGHT TOTAL HIP ARTHROPLASTY ANTERIOR APPROACH;  Surgeon: Mcarthur Rossetti, MD;  Location: [facility];  Service: Orthopedics;  Laterality: Right;  . Eye surgery Bilateral     cataract extraction with IOL    No prescriptions prior to admission   Allergies  Allergen Reactions  . Naproxen Swelling and Other (  See Comments)    Blacked out - Face swelling   . Sulfur Hives  . Tramadol Nausea And Vomiting    Social History  Substance Use Topics  . Smoking status: Never Smoker   . Smokeless tobacco: Never Used  . Alcohol Use: No    No family history on file.   Review of Systems  Musculoskeletal: Positive for joint pain.  All other systems reviewed and are negative.   Objective:  Physical Exam  Constitutional: She is oriented to person, place, and time. She appears well-developed and  well-nourished.  HENT:  Head: Normocephalic and atraumatic.  Eyes: EOM are normal. Pupils are equal, round, and reactive to light.  Neck: Normal range of motion. Neck supple.  Cardiovascular: Normal rate and regular rhythm.   Respiratory: Effort normal and breath sounds normal.  GI: Soft. Bowel sounds are normal.  Musculoskeletal:       Left hip: She exhibits decreased range of motion, decreased strength, tenderness and bony tenderness.  Neurological: She is alert and oriented to person, place, and time.  Skin: Skin is warm and dry.  Psychiatric: She has a normal mood and affect.    Vital signs in last 24 hours:    Labs:   Estimated body mass index is 35.17 kg/(m^2) as calculated from the following:   Height as of 04/29/15: 5\' 4"  (1.626 m).   Weight as of 04/29/15: 92.987 kg (205 lb).   Imaging Review Plain radiographs demonstrate severe degenerative joint disease of the left hip(s). The bone quality appears to be good for age and reported activity level.  Assessment/Plan:  End stage arthritis, left hip(s)  The patient history, physical examination, clinical judgement of the provider and imaging studies are consistent with end stage degenerative joint disease of the left hip(s) and total hip arthroplasty is deemed medically necessary. The treatment options including medical management, injection therapy, arthroscopy and arthroplasty were discussed at length. The risks and benefits of total hip arthroplasty were presented and reviewed. The risks due to aseptic loosening, infection, stiffness, dislocation/subluxation,  thromboembolic complications and other imponderables were discussed.  The patient acknowledged the explanation, agreed to proceed with the plan and consent was signed. Patient is being admitted for inpatient treatment for surgery, pain control, PT, OT, prophylactic antibiotics, VTE prophylaxis, progressive ambulation and ADL's and discharge planning.The patient is planning  to be discharged to skilled nursing facility

## 2015-06-01 NOTE — Brief Op Note (Signed)
06/01/2015  5:13 PM  PATIENT:  Julie Baxter  79 y.o. female  PRE-OPERATIVE DIAGNOSIS:  primary osteoarthritis left hip  POST-OPERATIVE DIAGNOSIS:  primary osteoarthritis left hip  PROCEDURE:  Procedure(s): LEFT TOTAL HIP ARTHROPLASTY ANTERIOR APPROACH (Left)  SURGEON:  Surgeon(s) and Role:    * Mcarthur Rossetti, MD - Primary  PHYSICIAN ASSISTANT: Benita Stabile, PA-C  ANESTHESIA:   spinal  EBL:  Total I/O In: 1000 [I.V.:1000] Out: 200 [Blood:200]  BLOOD ADMINISTERED:none  DRAINS: none   LOCAL MEDICATIONS USED:  NONE  SPECIMEN:  No Specimen  DISPOSITION OF SPECIMEN:  N/A  COUNTS:  YES  TOURNIQUET:  * No tourniquets in log *  DICTATION: .Other Dictation: Dictation Number [id number]  PLAN OF CARE: Admit to inpatient   PATIENT DISPOSITION:  PACU - hemodynamically stable.   Delay start of Pharmacological VTE agent (>24hrs) due to surgical blood loss or risk of bleeding: no

## 2015-06-02 ENCOUNTER — Encounter (HOSPITAL_COMMUNITY): Payer: Self-pay | Admitting: Orthopaedic Surgery

## 2015-06-02 LAB — GLUCOSE, CAPILLARY
GLUCOSE-CAPILLARY: 184 mg/dL — AB (ref 65–99)
GLUCOSE-CAPILLARY: 195 mg/dL — AB (ref 65–99)
GLUCOSE-CAPILLARY: 174 mg/dL — AB (ref 65–99)
Glucose-Capillary: 207 mg/dL — ABNORMAL HIGH (ref 65–99)

## 2015-06-02 LAB — CBC
HCT: 33 % — ABNORMAL LOW (ref 36.0–46.0)
HEMOGLOBIN: 11.3 g/dL — AB (ref 12.0–15.0)
MCH: 31.5 pg (ref 26.0–34.0)
MCHC: 34.2 g/dL (ref 30.0–36.0)
MCV: 91.9 fL (ref 78.0–100.0)
PLATELETS: 181 10*3/uL (ref 150–400)
RBC: 3.59 MIL/uL — ABNORMAL LOW (ref 3.87–5.11)
RDW: 12.9 % (ref 11.5–15.5)
WBC: 9.6 10*3/uL (ref 4.0–10.5)

## 2015-06-02 LAB — BASIC METABOLIC PANEL
Anion gap: 10 (ref 5–15)
BUN: 15 mg/dL (ref 6–20)
CHLORIDE: 99 mmol/L — AB (ref 101–111)
CO2: 25 mmol/L (ref 22–32)
CREATININE: 1.08 mg/dL — AB (ref 0.44–1.00)
Calcium: 8.5 mg/dL — ABNORMAL LOW (ref 8.9–10.3)
GFR calc Af Amer: 56 mL/min — ABNORMAL LOW (ref >60)
GFR calc non Af Amer: 48 mL/min — ABNORMAL LOW (ref >60)
GLUCOSE: 201 mg/dL — AB (ref 65–99)
POTASSIUM: 4.1 mmol/L (ref 3.5–5.1)
SODIUM: 134 mmol/L — AB (ref 135–145)

## 2015-06-02 NOTE — Op Note (Signed)
NAMEJADELYN, SEITZ NO.:  [id number]  MEDICAL RECORD NO.:  [id number]  LOCATION:  5N01C                        FACILITY:  [facility]  PHYSICIAN:  Lind Guest. Ninfa Linden, M.D.DATE OF BIRTH:  1935-12-11  DATE OF PROCEDURE:  06/01/2015 DATE OF DISCHARGE:                              OPERATIVE REPORT   PREOPERATIVE DIAGNOSES:  Primary osteoarthritis and degenerative joint disease, left hip.  POSTOPERATIVE DIAGNOSES:  Primary osteoarthritis and degenerative joint disease, left hip.  PROCEDURE:  Left total hip arthroplasty through direct anterior approach.  IMPLANTS:  DePuy Sector Gription acetabular component size 50, size 32+ 4 polyethylene liner, size 13 Corail femoral component with varus offset (KLA), size 32+ 5 ceramic hip ball.  SURGEON:  Lind Guest. Ninfa Linden, M.D.  ASSISTANT:  Erskine Emery, PA-C  ANESTHESIA:  Spinal.  ANTIBIOTICS:  2 g of IV Ancef.  BLOOD LOSS:  200 mL.  COMPLICATIONS:  None.  INDICATIONS:  Ms. Shibley is a 79 year old with known severe primary osteoarthritis involving her left hip.  She had a previous successful right total hip arthroplasty in 2014 and now she wishes to have her left hip replaced.  Her x-rays show complete loss of the joint space on the left hip.  She has periarticular osteophytes, sclerotic changes, and cystic changes as well.  Her pain is daily, it has detrimentally affected her activities of daily living, her mobility, and quality of life.  She understands with surgery the risks for acute blood loss anemia, nerve and vessel injury, fracture, infection, dislocation, and DVT.  She understands our goals are decreased pain, improved mobility, and overall improved quality of life.  DESCRIPTION OF PROCEDURE:  After informed consent was obtained, appropriate left hip was marked.  She was brought to the operating room where spinal anesthesia was obtained while she was on her stretcher. She was then placed  supine on the HANA fracture table with the perineal post in place and both legs in inline skeletal traction devices, but no traction applied.  The left operative hip was then prepped and draped with DuraPrep and sterile drapes.  Time-out was called and she was identified as correct patient and correct left hip.  We then made an incision inferior and posterior to the anterosuperior iliac spine and carried this obliquely down the leg.  We dissected down the tensor fascia lata muscle and tensor fascia was then divided longitudinally, so we could proceed with a direct anterior approach to the hip.  We identified and cauterized the lateral femoral circumflex vessels and identified the hip capsule.  We opened the hip capsule in a L-type format finding a large joint effusion.  I placed Cobra retractors within the hip capsule and then made our femoral neck cut with an oscillating saw just proximal to the lesser trochanter and completed this with an osteotome.  We then removed the femoral head in its entirety and found it to be devoid of cartilage completely.  We then cleaned the acetabulum, remnants of acetabular labrum, and other debris and released the transverse acetabular ligament.  I placed a bent Hohmann over the medial acetabular rim and a Cobra retractor laterally.  We then began reaming under direct visualization from a size 42  up to a size 50.  With a size 50, we placed this also under direct fluoroscopy, so we could obtain our depth of reaming, our inclination, and anteversion.  Once we did this, we placed a real DePuy Sector Gription acetabular component size 50 and a real 32+ 4 neutral polyethylene liner for that size of acetabular component.  Attention was then turned to the femur with the leg externally rotated to 100 degrees, extended and adducted, we were able to place a Mueller retractor medially and a Hohmann retractor behind the greater trochanter, we released the joint capsule  and used a box cutting osteotome to enter the femoral canal and a rongeur to lateralize.  We then began broaching from a size 8 broach using the Corail broaching system up to a size 13.  With the size 13, we trialed a KLA varus offset femoral neck and a 32+ 1 hip ball.  We rolled the leg back over and up with traction and internal rotation reduced in the pelvis.  We were pleased with stability, but I felt like she needed just a little bit more leg length and offset, so we decided we would go with a +5 hip ball.  We then dislocated the hip and removed the trial components.  We placed the real Corail femoral component, size 13 with varus offset and the real 32+ 5 ceramic hip ball and reduced this in the acetabulum.  Again, we were pleased with stability, leg length, offset. We then irrigated the soft tissue with normal saline solution using pulsatile lavage.  We closed the joint capsule with interrupted #1 Ethibond suture followed by running #1 Vicryl in the tensor fascia, 0- Vicryl in the deep tissue, 2-0 Vicryl in the subcutaneous tissue, 4-0 Monocryl subcuticular stitch, and Steri-Strips on the skin, and Aquacel dressing was applied.  She was taken off the HANA table and taken to the recovery room in a stable condition.  All final counts were correct. There were no complications noted.  Of note, Erskine Emery, PA-C assisted in the entire case.  His assistance was crucial for facilitating all aspects of this case.     Lind Guest. Ninfa Linden, M.D.     CYB/MEDQ  D:  06/01/2015  T:  06/02/2015  Job:  [id number]

## 2015-06-02 NOTE — Care Management Note (Signed)
Case Management Note  Patient Details  Name: [name] MRN: [id number] Date of Birth: 1935-09-21  Subjective/Objective:  79 yr old female s/p left total hip arthroplasty.                   Action/Plan:  Case manager spoke with patient concerning home health and DME needs. Patient requested to use [facility], she was preoperatively setup with [facility] but wants to use agency she used with other surgery. Case manager contacted Lelan Pons with [facility] with referral. Patient states she has rolling walker and 3in1 at home. Will have family assistance at discharge. .     Expected Discharge Date:    06/03/15              Expected Discharge Plan:   Home with Home Health  In-House Referral:  NA  Discharge planning Services  CM Consult  Post Acute Care Choice:  Home Health Choice offered to:  Patient  DME Arranged:  N/A DME Agency:  NA  HH Arranged:  PT [place] Agency:  [facility]  Status of Service:  Completed, signed off  Medicare Important Message Given:    Date Medicare IM Given:    Medicare IM give by:    Date Additional Medicare IM Given:    Additional Medicare Important Message give by:     If discussed at [place] of Stay Meetings, dates discussed:    Additional Comments:  Ninfa Meeker, RN 06/02/2015, 1:23 PM

## 2015-06-02 NOTE — Clinical Documentation Improvement (Signed)
Orthopedic  Can the diagnosis of CKD be further specified?   CKD Stage I - GFR greater than or equal to 90  CKD Stage II - GFR 60-89  CKD Stage III - GFR 30-59  CKD Stage IV - GFR 15-29  CKD Stage V - GFR < 15  ESRD (End Stage Renal Disease)  Other condition  Unable to clinically determine  Supporting Information: : (risk factors, signs and symptoms, diagnostics, treatment) 06/01/15 H&P.Marland KitchenMarland Kitchen"Chronic kidney disease"... Results for Julie Baxter, Julie Baxter (MRN [id number]) as of 06/02/2015 16:00  05/21/2015 10:08 06/02/2015 05:05  BUN 18 15  Creatinine 1.32 (H) 1.08 (H)  EGFR (Non-African Amer.) 38 (L) 48 (L)  06/02/15: daily BMP; 0.9 % NS @ 75cc/hr IV continous  Please exercise your independent, professional judgment when responding. A specific answer is not anticipated or expected.  Thank You, Ermelinda Das, RN, BSN, [place] Certified Clinical Documentation Specialist [place]: Health Information Management [phone]

## 2015-06-02 NOTE — Progress Notes (Signed)
Occupational Therapy Evaluation Patient Details Name: Julie Baxter MRN: [id number] DOB: [birth year] Today's Date: 06/02/2015    History of Present Illness Lt anterior THA   Clinical Impression   PTA,pt independent with ADL and mobility.Pt plans to D/C home and requires mod a with LB ADL and min A with mobility at RW level. Only able to ambulate @ 25 ft this pm while complaining of not being able to ambulate. At this time, do not feel pt will be safe to D/C home tomorrow, unless she makes better progress in am. Most likely be ready to D/C home with 24/7 S Friday if medically stable. Will follow acutely to address established goals. Daughter present for this session.    Follow Up Recommendations  Supervision/Assistance - 24 hour;Home health OT    Equipment Recommendations  None recommended by OT    Recommendations for Other Services       Precautions / Restrictions Precautions Precautions: Fall Restrictions Weight Bearing Restrictions: Yes LLE Weight Bearing: Weight bearing as tolerated      Mobility General bed mobility comments: up in recliner  Transfers Overall transfer level: Needs assistance Equipment used: Rolling walker (2 wheeled) Transfers: Sit to/from Stand Sit to Stand: Min assist         General transfer comment: cues for hand placement - trying to pull up on RW; unsafe transfer back to chair after walking  - pt began reaching for chair to sit @ 2 feet from chair    Balance Overall balance assessment: Needs assistance Sitting-balance support: No upper extremity supported Sitting balance-Leahy Scale: Fair     Standing balance support: Bilateral upper extremity supported Standing balance-Leahy Scale: Poor Standing balance comment: heacy reliance on RW                            ADL Overall ADL's : Needs assistance/impaired     Grooming: Set up   Upper Body Bathing: Set up;Sitting   Lower Body Bathing: Moderate assistance;Sit  to/from stand   Upper Body Dressing : Set up;Sitting   Lower Body Dressing: Moderate assistance;Sit to/from stand   Toilet Transfer: Minimal assistance;Ambulation;RW   Toileting- Clothing Manipulation and Hygiene: Moderate assistance       Functional mobility during ADLs: Minimal assistance;Rolling walker;Cueing for safety;Cueing for sequencing General ADL Comments: Pt will have assistance for ADL by family. Has DME needed. While ambulating, pt saying "don't let me fall" Encouraged pt to only use BSC with staff and not the bedpan. Discussed increasing mobility with staff in order to facilitate safe D/C home.                     Pertinent Vitals/Pain Pain Assessment: 0-10 Pain Score: 5  Faces Pain Scale: Hurts little more Pain Location: L hip Pain Descriptors / Indicators: Aching Pain Intervention(s): Limited activity within patient's tolerance;Repositioned;Ice applied     Hand Dominance     Extremity/Trunk Assessment Upper Extremity Assessment Upper Extremity Assessment: Overall WFL for tasks assessed   Lower Extremity Assessment Lower Extremity Assessment: Defer to PT evaluation   Cervical / Trunk Assessment Cervical / Trunk Assessment: Normal   Communication Communication Communication: No difficulties   Cognition Arousal/Alertness: Awake/alert Behavior During Therapy: Anxious Overall Cognitive Status: Within Functional Limits for tasks assessed                           Shoulder Instructions  Home Living Family/patient expects to be discharged to:: Private residence Living Arrangements: Spouse/significant other Available Help at Discharge: Family Type of Home: House Home Access: Stairs to enter Technical brewer of Steps: 3 Entrance Stairs-Rails: Left Home Layout: One level     Bathroom Shower/Tub: Tub/shower unit Shower/tub characteristics: Door Biochemist, clinical: Standard Bathroom Accessibility: Yes How Accessible: Accessible  via walker Home Equipment: Peach - 2 wheels;Bedside commode;Tub bench          Prior Functioning/Environment Level of Independence: Independent             OT Diagnosis: Generalized weakness;Acute pain   OT Problem List: Decreased strength;Decreased range of motion;Decreased activity tolerance;Impaired balance (sitting and/or standing);Decreased knowledge of use of DME or AE;Obesity;Pain   OT Treatment/Interventions: Self-care/ADL training;DME and/or AE instruction;Therapeutic activities;Patient/family education    OT Goals(Current goals can be found in the care plan section) Acute Rehab OT Goals Patient Stated Goal: go home from hospital OT Goal Formulation: With patient Time For Goal Achievement: 06/09/15 Potential to Achieve Goals: Good ADL Goals Pt Will Perform Lower Body Bathing: with supervision;with set-up;with caregiver independent in assisting;sit to/from stand Pt Will Perform Lower Body Dressing: with set-up;with supervision;with caregiver independent in assisting;sit to/from stand Pt Will Transfer to Toilet: with supervision;ambulating;bedside commode (with caregiver assisting) Pt Will Perform Toileting - Clothing Manipulation and hygiene: with min guard assist;sit to/from stand;with caregiver independent in assisting Pt Will Perform Tub/Shower Transfer: tub bench;rolling walker;with min guard assist;ambulating;with caregiver independent in assisting  OT Frequency: Min 2X/week   Barriers to D/C:            Co-evaluation              End of Session Equipment Utilized During Treatment: Gait belt;Rolling walker Nurse Communication: Mobility status  Activity Tolerance: Patient tolerated treatment well Patient left: in chair;with call bell/phone within reach;with family/visitor present   Time: 1540-1610 OT Time Calculation (min): 30 min Charges:  OT General Charges $OT Visit: 1 Procedure OT Evaluation $Initial OT Evaluation Tier I: 1 Procedure OT  Treatments $Self Care/Home Management : 8-22 mins G-Codes:    Julie Baxter,Julie Baxter Jun 09, 2015, 4:19 PM   [facility], OTR/L  [phone] 09-Jun-2015

## 2015-06-02 NOTE — Progress Notes (Signed)
Subjective: 1 Day Post-Op Procedure(s) (LRB): LEFT TOTAL HIP ARTHROPLASTY ANTERIOR APPROACH (Left) Patient reports pain as moderate.    Objective: Vital signs in last 24 hours: Temp:  [97.7 F (36.5 C)-98.6 F (37 C)] 98.4 F (36.9 C) (10/05 0400) Pulse Rate:  [58-70] 70 (10/05 0400) Resp:  [11-20] 18 (10/05 0400) BP: (57-166)/(36-68) 135/51 mmHg (10/05 0400) SpO2:  [95 %-100 %] 99 % (10/05 0400) FiO2 (%):  [0 %] 0 % (10/04 2007) Weight:  [92.307 kg (203 lb 8 oz)] 92.307 kg (203 lb 8 oz) (10/04 1203)  Intake/Output from previous day: 10/04 0701 - 10/05 0700 In: 2575 [P.O.:480; I.V.:2095] Out: 1850 [Urine:1650; Blood:200] Intake/Output this shift:     Recent Labs  06/02/15 0505  HGB 11.3*    Recent Labs  06/02/15 0505  WBC 9.6  RBC 3.59*  HCT 33.0*  PLT 181    Recent Labs  06/02/15 0505  NA 134*  K 4.1  CL 99*  CO2 25  BUN 15  CREATININE 1.08*  GLUCOSE 201*  CALCIUM 8.5*   No results for input(s): LABPT, INR in the last 72 hours.  Sensation intact distally Intact pulses distally Dorsiflexion/Plantar flexion intact Incision: scant drainage  Assessment/Plan: 1 Day Post-Op Procedure(s) (LRB): LEFT TOTAL HIP ARTHROPLASTY ANTERIOR APPROACH (Left) Up with therapy  Nitish Roes Y 06/02/2015, 7:13 AM

## 2015-06-02 NOTE — Clinical Documentation Improvement (Signed)
Orthopedic  Abnormal Lab/Test Results:  06/02/15 BMP noted  Possible Clinical Conditions associated with below indicators  Hyponatremia  Other Condition  Cannot Clinically Determine  Supporting Information: Results for Julie Baxter, Julie Baxter (MRN [id number]) as of 06/02/2015 16:00  Ref. Range 06/02/2015 05:05  Sodium Latest Ref Range: 135-145 mmol/L 134 (L)   Treatment Provided: 06/02/15: daily BMP; 0.9% NS @ 75cc/hr IV continous  Please exercise your independent, professional judgment when responding. A specific answer is not anticipated or expected.  Thank You,  Ermelinda Das, RN, BSN, [place] Certified Clinical Documentation Specialist [place]: Health Information Management [phone]

## 2015-06-02 NOTE — Plan of Care (Signed)
Problem: Consults Goal: Diagnosis- Total Joint Replacement Primary Total Hip     

## 2015-06-02 NOTE — Progress Notes (Signed)
Physical Therapy Treatment Patient Details Name: Julie Baxter MRN: [id number] DOB: 1936-08-15 Today's Date: 06/02/2015    History of Present Illness Lt anterior THA    PT Comments    Patient able to make gradual progress with ambulation to 25 feet using rw and min guard. Patient reporting increased hip pain with mobility but decreasing once sitting down. Reviewing bed mobility and transfer techniques during session to progress mobility and safety. Continue to anticipate D/C to home with family support.   Follow Up Recommendations  Home health PT;Supervision for mobility/OOB     Equipment Recommendations  None recommended by PT;Other (comment)    Recommendations for Other Services       Precautions / Restrictions Precautions Precautions: Fall Restrictions Weight Bearing Restrictions: Yes LLE Weight Bearing: Weight bearing as tolerated    Mobility  Bed Mobility Overal bed mobility: Needs Assistance Bed Mobility: Supine to Sit     Supine to sit: Min assist;HOB elevated     General bed mobility comments: cues for sequence  Transfers Overall transfer level: Needs assistance Equipment used: Rolling walker (2 wheeled) Transfers: Sit to/from Stand Sit to Stand: Min assist         General transfer comment: cues for hand placement  Ambulation/Gait Ambulation/Gait assistance: Min guard Ambulation Distance (Feet): 25 Feet Assistive device: Rolling walker (2 wheeled) Gait Pattern/deviations: Step-to pattern;Decreased stance time - left;Decreased weight shift to left Gait velocity: decreased   General Gait Details: encouragement needed to gradually increase distance   Stairs            Wheelchair Mobility    Modified Rankin (Stroke Patients Only)       Balance Overall balance assessment: Needs assistance Sitting-balance support: No upper extremity supported Sitting balance-Leahy Scale: Fair     Standing balance support: Bilateral upper extremity  supported Standing balance-Leahy Scale: Poor Standing balance comment: using rw                    Cognition Arousal/Alertness: Awake/alert Behavior During Therapy: WFL for tasks assessed/performed Overall Cognitive Status: Within Functional Limits for tasks assessed                      Exercises Total Joint Exercises Ankle Circles/Pumps: AROM;Both;10 reps Quad Sets: Strengthening;Left;10 reps Gluteal Sets: Strengthening;Both;10 reps    General Comments        Pertinent Vitals/Pain Pain Assessment: Faces Pain Score: 7  Faces Pain Scale: Hurts little more Pain Location: Lt hip Pain Descriptors / Indicators: Aching;Burning Pain Intervention(s): Limited activity within patient's tolerance;Monitored during session;Repositioned;Ice applied    Home Living       Prior Function          PT Goals (current goals can now be found in the care plan section) Acute Rehab PT Goals Patient Stated Goal: go home from hospital PT Goal Formulation: With patient Time For Goal Achievement: 06/16/15 Potential to Achieve Goals: Good Progress towards PT goals: Progressing toward goals    Frequency  7X/week    PT Plan Current plan remains appropriate    Co-evaluation             End of Session Equipment Utilized During Treatment: Gait belt Activity Tolerance: Patient limited by pain Patient left: in chair;with call bell/phone within reach;with family/visitor present     Time: [id number] PT Time Calculation (min) (ACUTE ONLY): 25 min  Charges:  $Gait Training: 8-22 mins $Therapeutic Activity: 8-22 mins  G Codes:      Cassell Clement, PT, CSCS Pager [phone] Office [phone]  06/02/2015, 2:46 PM

## 2015-06-02 NOTE — Progress Notes (Signed)
OT Cancellation Note  Patient Details Name: Julie Baxter MRN: [id number] DOB: 1935-09-23   Cancelled Treatment:    Reason Eval/Treat Not Completed: Other (comment) Pt and current D/C plan is SNF. No apparent immediate acute care OT needs, therefore will defer OT to SNF. If OT eval is needed please call Acute Rehab Dept. at [phone] or text page OT at [phone].  [place], OTR/L  [id number] 06/02/2015 06/02/2015, 6:47 AM

## 2015-06-02 NOTE — Progress Notes (Signed)
Utilization review completed.  

## 2015-06-02 NOTE — Evaluation (Signed)
Physical Therapy Evaluation Patient Details Name: Julie Baxter MRN: [id number] DOB: 24-Feb-1936 Today's Date: 06/02/2015   History of Present Illness  Lt anterior THA  Clinical Impression  Pt is s/p THA resulting in the deficits listed below (see PT Problem List). Pt will benefit from skilled PT to increase their independence and safety with mobility to allow discharge to the venue listed below. Patient reporting that she is planning to go directly home from the hospital upon D/C.       Follow Up Recommendations Home health PT;Supervision for mobility/OOB    Equipment Recommendations  None recommended by PT;Other (comment) (patient reports having all equipment needs met)    Recommendations for Other Services       Precautions / Restrictions Precautions Precautions: Fall Restrictions Weight Bearing Restrictions: Yes LLE Weight Bearing: Weight bearing as tolerated      Mobility  Bed Mobility Overal bed mobility: Needs Assistance Bed Mobility: Supine to Sit     Supine to sit: Min assist;HOB elevated     General bed mobility comments: assist with LLE, HOB elevated, using rail  Transfers Overall transfer level: Needs assistance Equipment used: Rolling walker (2 wheeled) Transfers: Sit to/from Stand Sit to Stand: Min assist         General transfer comment: cues for hand placement  Ambulation/Gait Ambulation/Gait assistance: Min guard Ambulation Distance (Feet): 5 Feet Assistive device: Rolling walker (2 wheeled) Gait Pattern/deviations: Step-to pattern;Decreased weight shift to left;Decreased stance time - left Gait velocity: decreased   General Gait Details: patient requesting to sit after 5 feet.   Stairs            Wheelchair Mobility    Modified Rankin (Stroke Patients Only)       Balance Overall balance assessment: Needs assistance Sitting-balance support: No upper extremity supported Sitting balance-Leahy Scale: Fair     Standing  balance support: Bilateral upper extremity supported Standing balance-Leahy Scale: Poor Standing balance comment: using rw                             Pertinent Vitals/Pain Pain Assessment: 0-10 Pain Score: 7  Pain Location: Lt hip Pain Descriptors / Indicators: Burning Pain Intervention(s): Limited activity within patient's tolerance;Monitored during session;Repositioned;Ice applied    Home Living Family/patient expects to be discharged to:: Private residence Living Arrangements: Spouse/significant other Available Help at Discharge: Family Type of Home: House Home Access: Stairs to enter Entrance Stairs-Rails: Left Entrance Stairs-Number of Steps: 3 Home Layout: One level Home Equipment: Walker - 2 wheels      Prior Function Level of Independence: Independent               Hand Dominance        Extremity/Trunk Assessment               Lower Extremity Assessment: LLE deficits/detail   LLE Deficits / Details: requiring assistance to move LLE with bed mobility     Communication   Communication: No difficulties  Cognition Arousal/Alertness: Awake/alert Behavior During Therapy: WFL for tasks assessed/performed Overall Cognitive Status: Within Functional Limits for tasks assessed                      General Comments      Exercises        Assessment/Plan    PT Assessment Patient needs continued PT services  PT Diagnosis Difficulty walking;Abnormality of gait;Generalized weakness;Acute pain   PT Problem  List Decreased strength;Decreased range of motion;Decreased activity tolerance;Decreased balance;Decreased mobility;Decreased coordination;Decreased knowledge of use of DME;Decreased safety awareness;Pain  PT Treatment Interventions DME instruction;Gait training;Stair training;Functional mobility training;Therapeutic activities;Therapeutic exercise;Balance training;Patient/family education   PT Goals (Current goals can be found in  the Care Plan section) Acute Rehab PT Goals Patient Stated Goal: go home from hospital PT Goal Formulation: With patient Time For Goal Achievement: 06/16/15 Potential to Achieve Goals: Good    Frequency 7X/week   Barriers to discharge        Co-evaluation               End of Session Equipment Utilized During Treatment: Gait belt Activity Tolerance: Patient limited by fatigue Patient left: in chair;with call bell/phone within reach;with family/visitor present Nurse Communication: Mobility status         Time: 5465-0354 PT Time Calculation (min) (ACUTE ONLY): 32 min   Charges:   PT Evaluation $Initial PT Evaluation Tier I: 1 Procedure PT Treatments $Therapeutic Activity: 8-22 mins   PT G Codes:        Cassell Clement, PT, CSCS Pager [phone] Office [phone]  06/02/2015, 10:56 AM

## 2015-06-03 ENCOUNTER — Encounter (HOSPITAL_COMMUNITY): Payer: Self-pay | Admitting: General Practice

## 2015-06-03 LAB — GLUCOSE, CAPILLARY
GLUCOSE-CAPILLARY: 245 mg/dL — AB (ref 65–99)
Glucose-Capillary: 133 mg/dL — ABNORMAL HIGH (ref 65–99)

## 2015-06-03 MED ORDER — ASPIRIN 325 MG PO TBEC: 325.0000 mg | DELAYED_RELEASE_TABLET | Freq: Every day | ORAL | Status: DC

## 2015-06-03 MED ORDER — METHOCARBAMOL 500 MG PO TABS: 500.0000 mg | ORAL_TABLET | Freq: Four times a day (QID) | ORAL | Status: DC | PRN

## 2015-06-03 NOTE — Discharge Instructions (Signed)

## 2015-06-03 NOTE — Progress Notes (Signed)
Patient discharged home with husband. Prescriptions given. [facility] will provide [facility]. Discharge instructions given.

## 2015-06-03 NOTE — Discharge Summary (Signed)
Patient ID: Julie Baxter MRN: [id number] DOB/AGE: June 16, 1936 79 y.o.  Admit date: 06/01/2015 Discharge date: 06/03/2015  Admission Diagnoses:  Principal Problem:   Osteoarthritis of left hip Active Problems:   Status post total replacement of left hip   Discharge Diagnoses:  Same  Past Medical History  Diagnosis Date  . Hypertension   . Arthritis   . Pain in joint, pelvic region and thigh   . Hyperpotassemia   . Osteoarthrosis, unspecified whether generalized or localized, unspecified site   . Overweight(278.02)   . Diabetic retinopathy ([place])   . Asymptomatic varicose veins   . Edema   . Unspecified disorder of kidney and ureter   . Unspecified disorder of kidney and ureter   . Pain in joint, lower leg   . Other specified disease of nail   . Urinary tract infection, site not specified   . Other and unspecified hyperlipidemia   . Other abnormal blood chemistry   . Other malaise and fatigue   . Undiagnosed cardiac murmurs   . PONV (postoperative nausea and vomiting)     pt. states that she was sick after hip surgery and oral surgery  . Peripheral vascular disease ([place])     varicose veins-  "Blood Clot anterior left LEG WITH PREGNANCY"  . Chronic kidney disease   . Anxiety     regarding surgery; occassionally pt. states  . Anxiety state, unspecified   . Insomnia, unspecified   . Multiple thyroid nodules   . Inflammation of joint of knee     right knee  . Hard of hearing   . Type II or unspecified type diabetes mellitus with renal manifestations, not stated as uncontrolled     Surgeries: Procedure(s): LEFT TOTAL HIP ARTHROPLASTY ANTERIOR APPROACH on 06/01/2015   Consultants:    Discharged Condition: Improved  Hospital Course: Julie Baxter is an 79 y.o. female who was admitted 06/01/2015 for operative treatment ofOsteoarthritis of left hip. Patient has severe unremitting pain that affects sleep, daily activities, and work/hobbies. After pre-op clearance  the patient was taken to the operating room on 06/01/2015 and underwent  Procedure(s): LEFT TOTAL HIP ARTHROPLASTY ANTERIOR APPROACH.    Patient was given perioperative antibiotics: Anti-infectives    Start     Dose/Rate Route Frequency Ordered Stop   06/01/15 2200  ceFAZolin (ANCEF) IVPB 1 g/50 mL premix     1 g 100 mL/hr over 30 Minutes Intravenous Every 6 hours 06/01/15 2006 06/02/15 0444   06/01/15 1400  ceFAZolin (ANCEF) IVPB 2 g/50 mL premix     2 g 100 mL/hr over 30 Minutes Intravenous To [facility] 05/31/15 1335 06/01/15 1546       Patient was given sequential compression devices, early ambulation, and chemoprophylaxis to prevent DVT.  Patient benefited maximally from hospital stay and there were no complications.    Recent vital signs: Patient Vitals for the past 24 hrs:  BP Temp Temp src Pulse Resp SpO2  06/03/15 0515 (!) 110/44 mmHg 99.5 F (37.5 C) Oral 86 18 96 %  06/02/15 2231 (!) 99/38 mmHg 99.4 F (37.4 C) Oral 88 18 97 %  06/02/15 1812 - 99.9 F (37.7 C) - - - -  06/02/15 1802 - 99.4 F (37.4 C) Oral - - -  06/02/15 1701 - 100 F (37.8 C) Oral - - -  06/02/15 1453 117/62 mmHg (!) 101.5 F (38.6 C) Oral 86 17 100 %  06/02/15 1043 (!) 128/51 mmHg - - 79 - -  Recent laboratory studies:  Recent Labs  06/02/15 0505  WBC 9.6  HGB 11.3*  HCT 33.0*  PLT 181  NA 134*  K 4.1  CL 99*  CO2 25  BUN 15  CREATININE 1.08*  GLUCOSE 201*  CALCIUM 8.5*     Discharge Medications:     Medication List    STOP taking these medications        aspirin 81 MG tablet  Replaced by:  aspirin 325 MG EC tablet      TAKE these medications        ACCU-CHEK SMARTVIEW test strip  Generic drug:  glucose blood  Use to check blood sugar  twice daily     acetaminophen 500 MG tablet  Commonly known as:  TYLENOL  Take 1,000 mg by mouth every 8 (eight) hours as needed for mild pain or moderate pain.     AMBULATORY NON FORMULARY MEDICATION  Medication  Name:T/S ACCU-CHK Smartview STP Use to check blood sugar twice daily. Dx 250.00     AMBULATORY NON FORMULARY MEDICATION  Fastclix Lancets Sig: Use to test blood sugar twice daily. DX: E11.65     aspirin 325 MG EC tablet  Take 1 tablet (325 mg total) by mouth daily with breakfast.     atenolol 50 MG tablet  Commonly known as:  TENORMIN  Take one and 1/2 tablet by mouth twice daily     buPROPion 150 MG 24 hr tablet  Commonly known as:  WELLBUTRIN XL  Take 1 tablet (150 mg total) by mouth daily.     furosemide 20 MG tablet  Commonly known as:  LASIX  Take 1 tablet by mouth  daily for blood pressure  and edema.     glipiZIDE 5 MG tablet  Commonly known as:  GLUCOTROL  Take 1 tablet by mouth  twice a day before meals (breakfast and lunch)     losartan 50 MG tablet  Commonly known as:  COZAAR  Take one tablet twice daily to control blood pressure     metFORMIN 500 MG tablet  Commonly known as:  GLUCOPHAGE  Take 2 tablets by mouth two times daily with meals     methocarbamol 500 MG tablet  Commonly known as:  ROBAXIN  Take 1 tablet (500 mg total) by mouth every 6 (six) hours as needed for muscle spasms.     OVER THE COUNTER MEDICATION  Take 3 tablets by mouth daily as needed. Restful legs     simvastatin 40 MG tablet  Commonly known as:  ZOCOR  Take 1 tablet (40 mg total) by mouth daily.     SYSTANE BALANCE OP  Apply 1 drop to eye daily as needed (dry eyes).        Diagnostic Studies: Dg Hip Port Unilat With Pelvis 1v Left  06/01/2015   CLINICAL DATA:  Status post left total hip replacement.  EXAM: DG HIP (WITH OR WITHOUT PELVIS) 1V PORT LEFT  COMPARISON:  Dec 29, 2012.  FINDINGS: Status post right total hip arthroplasty as noted on prior exam. There is been interval placement of left total hip arthroplasty. The femoral and acetabular components appear to be well situated. No fracture or dislocation is noted.  IMPRESSION: Status post left total hip arthroplasty.    Electronically Signed   By: Marijo Conception, M.D.   On: 06/01/2015 18:25   Dg Hip Operative Unilat With Pelvis Left  06/01/2015   CLINICAL DATA:  Left total hip replacement.  EXAM:  OPERATIVE left HIP (WITH PELVIS IF PERFORMED) 2 VIEWS  TECHNIQUE: Fluoroscopic spot image(s) were submitted for interpretation post-operatively.  FLUOROSCOPY TIME:  30 seconds.  COMPARISON:  November 29, 2012.  FINDINGS: Two fluoroscopic intraoperative images were obtained. Status post right total hip arthroplasty as noted on prior exam. Interval placement of left total hip arthroplasty. The femoral and acetabular components appear to be well situated no fracture dislocation is noted.  IMPRESSION: Status post left total hip arthroplasty.   Electronically Signed   By: Marijo Conception, M.D.   On: 06/01/2015 17:04    Disposition: 06-Home-Health Care Svc      Discharge Instructions    Discharge patient    Complete by:  As directed            Follow-up Information    Follow up with [facility].   Why:  Someone from [facility] will contact you concerning start date and time for therapy.   Contact information:   [address] [place] [address] [phone]       Follow up with Mcarthur Rossetti, MD In 2 weeks.   Specialty:  Orthopedic Surgery   Contact information:   [place] [address] [phone]        Signed: Mcarthur Rossetti 06/03/2015, 9:37 AM

## 2015-06-03 NOTE — Progress Notes (Signed)
Subjective: 2 Days Post-Op Procedure(s) (LRB): LEFT TOTAL HIP ARTHROPLASTY ANTERIOR APPROACH (Left) Patient reports pain as moderate.  Doing very well with therapy.  Objective: Vital signs in last 24 hours: Temp:  [99.4 F (37.4 C)-101.5 F (38.6 C)] 99.5 F (37.5 C) (10/06 0515) Pulse Rate:  [79-88] 86 (10/06 0515) Resp:  [17-18] 18 (10/06 0515) BP: (99-128)/(38-62) 110/44 mmHg (10/06 0515) SpO2:  [96 %-100 %] 96 % (10/06 0515)  Intake/Output from previous day: 10/05 0701 - 10/06 0700 In: 720 [P.O.:720] Out: 200 [Urine:200] Intake/Output this shift: Total I/O In: 240 [P.O.:240] Out: -    Recent Labs  06/02/15 0505  HGB 11.3*    Recent Labs  06/02/15 0505  WBC 9.6  RBC 3.59*  HCT 33.0*  PLT 181    Recent Labs  06/02/15 0505  NA 134*  K 4.1  CL 99*  CO2 25  BUN 15  CREATININE 1.08*  GLUCOSE 201*  CALCIUM 8.5*   No results for input(s): LABPT, INR in the last 72 hours.  Sensation intact distally Intact pulses distally Dorsiflexion/Plantar flexion intact Incision: scant drainage  Assessment/Plan: 2 Days Post-Op Procedure(s) (LRB): LEFT TOTAL HIP ARTHROPLASTY ANTERIOR APPROACH (Left) Up with therapy Discharge home with home health this afternoon.  Raelene Trew Y 06/03/2015, 9:34 AM

## 2015-06-03 NOTE — Progress Notes (Addendum)
Physical Therapy Treatment Patient Details Name: Julie Baxter MRN: [id number] DOB: 1936/04/10 Today's Date: 06/03/2015    History of Present Illness Lt anterior THA    PT Comments    Patient is making good progress with PT.  From a mobility standpoint anticipate patient will be ready for DC home with family assistance following afternoon session. Anticipate focus on ambulation and stairs at next PT session.      Follow Up Recommendations  Home health PT;Supervision for mobility/OOB     Equipment Recommendations  None recommended by PT;Other (comment)    Recommendations for Other Services       Precautions / Restrictions Precautions Precautions: Fall Restrictions Weight Bearing Restrictions: Yes LLE Weight Bearing: Weight bearing as tolerated    Mobility  Bed Mobility Overal bed mobility: Needs Assistance Bed Mobility: Sit to Supine       Sit to supine: Min assist   General bed mobility comments: assistance with LLE  Transfers Overall transfer level: Needs assistance Equipment used: Rolling walker (2 wheeled) Transfers: Sit to/from Stand Sit to Stand: Supervision         General transfer comment: no cues needed.   Ambulation/Gait Ambulation/Gait assistance: Supervision Ambulation Distance (Feet): 75 Feet Assistive device: Rolling walker (2 wheeled) Gait Pattern/deviations: Step-through pattern;Decreased weight shift to left Gait velocity: decreased   General Gait Details: no loss of balance or instability   Stairs            Wheelchair Mobility    Modified Rankin (Stroke Patients Only)       Balance Overall balance assessment: Needs assistance Sitting-balance support: No upper extremity supported Sitting balance-Leahy Scale: Good     Standing balance support: During functional activity Standing balance-Leahy Scale: Fair                      Cognition Arousal/Alertness: Awake/alert Behavior During Therapy:  Anxious Overall Cognitive Status: Within Functional Limits for tasks assessed                      Exercises Total Joint Exercises Ankle Circles/Pumps: AROM;Both;10 reps Quad Sets: Strengthening;Left;10 reps Short Arc Quad: Strengthening;Left;10 reps Heel Slides: AAROM;Left;10 reps Hip ABduction/ADduction: Strengthening;Left;10 reps Long Arc Quad: Strengthening;Left;10 reps    General Comments        Pertinent Vitals/Pain Pain Assessment: No/denies pain Pain Location: Lt hip Pain Descriptors / Indicators: Sore (no pain, just sore) Pain Intervention(s): Limited activity within patient's tolerance;Monitored during session    Home Living Family/patient expects to be discharged to:: Private residence Living Arrangements: Spouse/significant other                  Prior Function            PT Goals (current goals can now be found in the care plan section) Acute Rehab PT Goals Patient Stated Goal: go home today PT Goal Formulation: With patient Time For Goal Achievement: 06/16/15 Potential to Achieve Goals: Good Progress towards PT goals: Progressing toward goals    Frequency  7X/week    PT Plan Current plan remains appropriate    Co-evaluation             End of Session Equipment Utilized During Treatment: Gait belt Activity Tolerance: Patient limited by fatigue Patient left: in bed;with call bell/phone within reach;with family/visitor present     Time: [id number] PT Time Calculation (min) (ACUTE ONLY): 28 min  Charges:  $Gait Training: 8-22 mins $Therapeutic Exercise: 8-22 mins  G Codes:      Cassell Clement, PT, CSCS Pager [phone] Office [phone]  06/03/2015, 1:33 PM

## 2015-06-03 NOTE — Progress Notes (Signed)
OT Cancellation Note  Patient Details Name: Julie Baxter MRN: [id number] DOB: 14-Mar-1936   Cancelled Treatment:    Reason Eval/Treat Not Completed: Patient declined, no reason specified. Pt declined working with OT this morning, states she just got back in bed from PT and wants to rest for a bit. Encouraged pt to get OOB to complete ADL or practice tub bench transfer, pt declined. Pt verbalized tub bench transfer technique. Pt reports she had R THA done previously, has all equipment and feels comfortable completing ADLs with assist from her husband. Will check back as time allows.   Binnie Kand  M.S., OTR/L Pager: [phone]   06/03/2015, 10:44 AM

## 2015-06-03 NOTE — Progress Notes (Addendum)
Physical Therapy Treatment Patient Details Name: Julie Baxter MRN: [id number] DOB: 25-Jan-1936 Today's Date: 06/03/2015    History of Present Illness Lt anterior THA    PT Comments    Patient is making good progress with PT.  From a mobility standpoint anticipate patient will be ready for DC home with family support. Patient and spouse deny any questions or concerns regarding going home. Patient had no loss of balance or instability with mobility.      Follow Up Recommendations  Home health PT;Supervision for mobility/OOB     Equipment Recommendations  None recommended by PT;Other (comment)    Recommendations for Other Services       Precautions / Restrictions Precautions Precautions: Fall Restrictions Weight Bearing Restrictions: Yes LLE Weight Bearing: Weight bearing as tolerated    Mobility  Bed Mobility           General bed mobility comments: found and returned to sitting  Transfers Overall transfer level: Needs assistance Equipment used: Rolling walker (2 wheeled) Transfers: Sit to/from Stand Sit to Stand: Supervision         General transfer comment: no cues needed.   Ambulation/Gait Ambulation/Gait assistance: Supervision Ambulation Distance (Feet): 70 Feet (1 seated rest ) Assistive device: Rolling walker (2 wheeled) Gait Pattern/deviations: Step-through pattern;Decreased weight shift to left Gait velocity: decreased   General Gait Details: no loss of balance or instability   Stairs Stairs: Yes Stairs assistance: Min guard Stair Management: One rail Left;Sideways;Step to pattern Number of Stairs: 2 General stair comments: spouse observing throughout. Patient and spouse report feeling confident with going up/down stairs at home.   Wheelchair Mobility    Modified Rankin (Stroke Patients Only)       Balance Overall balance assessment: Needs assistance Sitting-balance support: No upper extremity supported Sitting balance-Leahy  Scale: Good     Standing balance support: During functional activity Standing balance-Leahy Scale: Fair Standing balance comment: using rw                    Cognition Arousal/Alertness: Awake/alert Behavior During Therapy: WFL for tasks assessed/performed Overall Cognitive Status: Within Functional Limits for tasks assessed                      Exercises     General Comments        Pertinent Vitals/Pain Pain Assessment: No/denies pain Pain Score: 0-No pain (sore, not pain) Pain Location: LT hip Pain Descriptors / Indicators:  (sore, not pain) Pain Intervention(s): Limited activity within patient's tolerance;Monitored during session    Home Living                      Prior Function            PT Goals (current goals can now be found in the care plan section) Acute Rehab PT Goals Patient Stated Goal: go home today PT Goal Formulation: With patient Time For Goal Achievement: 06/16/15 Potential to Achieve Goals: Good Progress towards PT goals: Progressing toward goals    Frequency  7X/week    PT Plan Current plan remains appropriate    Co-evaluation             End of Session Equipment Utilized During Treatment: Gait belt Activity Tolerance: Patient limited by fatigue Patient left: in chair;with call bell/phone within reach;with family/visitor present     Time: 1353-1414 PT Time Calculation (min) (ACUTE ONLY): 21 min  Charges:  $Gait Training: 8-22 mins  G Codes:      Cassell Clement, PT, CSCS Pager [phone] Office [phone]  06/03/2015, 3:50 PM

## 2015-06-04 DIAGNOSIS — Z7984 Long term (current) use of oral hypoglycemic drugs: Secondary | ICD-10-CM | POA: Diagnosis not present

## 2015-06-04 DIAGNOSIS — Z471 Aftercare following joint replacement surgery: Secondary | ICD-10-CM | POA: Diagnosis not present

## 2015-06-04 DIAGNOSIS — Z96642 Presence of left artificial hip joint: Secondary | ICD-10-CM | POA: Diagnosis not present

## 2015-06-04 DIAGNOSIS — E1122 Type 2 diabetes mellitus with diabetic chronic kidney disease: Secondary | ICD-10-CM | POA: Diagnosis not present

## 2015-06-04 DIAGNOSIS — E669 Obesity, unspecified: Secondary | ICD-10-CM | POA: Diagnosis not present

## 2015-06-04 DIAGNOSIS — F419 Anxiety disorder, unspecified: Secondary | ICD-10-CM | POA: Diagnosis not present

## 2015-06-04 DIAGNOSIS — I129 Hypertensive chronic kidney disease with stage 1 through stage 4 chronic kidney disease, or unspecified chronic kidney disease: Secondary | ICD-10-CM | POA: Diagnosis not present

## 2015-06-04 DIAGNOSIS — E11319 Type 2 diabetes mellitus with unspecified diabetic retinopathy without macular edema: Secondary | ICD-10-CM | POA: Diagnosis not present

## 2015-06-04 DIAGNOSIS — N189 Chronic kidney disease, unspecified: Secondary | ICD-10-CM | POA: Diagnosis not present

## 2015-06-07 DIAGNOSIS — Z7984 Long term (current) use of oral hypoglycemic drugs: Secondary | ICD-10-CM | POA: Diagnosis not present

## 2015-06-07 DIAGNOSIS — E669 Obesity, unspecified: Secondary | ICD-10-CM | POA: Diagnosis not present

## 2015-06-07 DIAGNOSIS — E11319 Type 2 diabetes mellitus with unspecified diabetic retinopathy without macular edema: Secondary | ICD-10-CM | POA: Diagnosis not present

## 2015-06-07 DIAGNOSIS — I129 Hypertensive chronic kidney disease with stage 1 through stage 4 chronic kidney disease, or unspecified chronic kidney disease: Secondary | ICD-10-CM | POA: Diagnosis not present

## 2015-06-07 DIAGNOSIS — F419 Anxiety disorder, unspecified: Secondary | ICD-10-CM | POA: Diagnosis not present

## 2015-06-07 DIAGNOSIS — Z471 Aftercare following joint replacement surgery: Secondary | ICD-10-CM | POA: Diagnosis not present

## 2015-06-07 DIAGNOSIS — E1122 Type 2 diabetes mellitus with diabetic chronic kidney disease: Secondary | ICD-10-CM | POA: Diagnosis not present

## 2015-06-07 DIAGNOSIS — Z96642 Presence of left artificial hip joint: Secondary | ICD-10-CM | POA: Diagnosis not present

## 2015-06-07 DIAGNOSIS — N189 Chronic kidney disease, unspecified: Secondary | ICD-10-CM | POA: Diagnosis not present

## 2015-06-09 DIAGNOSIS — Z471 Aftercare following joint replacement surgery: Secondary | ICD-10-CM | POA: Diagnosis not present

## 2015-06-09 DIAGNOSIS — E1122 Type 2 diabetes mellitus with diabetic chronic kidney disease: Secondary | ICD-10-CM | POA: Diagnosis not present

## 2015-06-09 DIAGNOSIS — N189 Chronic kidney disease, unspecified: Secondary | ICD-10-CM | POA: Diagnosis not present

## 2015-06-09 DIAGNOSIS — E11319 Type 2 diabetes mellitus with unspecified diabetic retinopathy without macular edema: Secondary | ICD-10-CM | POA: Diagnosis not present

## 2015-06-09 DIAGNOSIS — F419 Anxiety disorder, unspecified: Secondary | ICD-10-CM | POA: Diagnosis not present

## 2015-06-09 DIAGNOSIS — Z7984 Long term (current) use of oral hypoglycemic drugs: Secondary | ICD-10-CM | POA: Diagnosis not present

## 2015-06-09 DIAGNOSIS — I129 Hypertensive chronic kidney disease with stage 1 through stage 4 chronic kidney disease, or unspecified chronic kidney disease: Secondary | ICD-10-CM | POA: Diagnosis not present

## 2015-06-09 DIAGNOSIS — Z96642 Presence of left artificial hip joint: Secondary | ICD-10-CM | POA: Diagnosis not present

## 2015-06-09 DIAGNOSIS — E669 Obesity, unspecified: Secondary | ICD-10-CM | POA: Diagnosis not present

## 2015-06-10 ENCOUNTER — Telehealth: Payer: Self-pay | Admitting: *Deleted

## 2015-06-10 NOTE — Telephone Encounter (Signed)
I recommend she take miralax.  One dose now and, if she does not go by evening, as second dose.

## 2015-06-10 NOTE — Telephone Encounter (Signed)
Patient called and stated that she has been constipated. Had gone 9 days before she went and then went on Tuesday but hasn't gone again since. No abdominal tenderness and is passing gas.  She has tried cooked prunes, herbal tea and the soft gel stool softners with no relief. Would like to know what you would recommend either Rx or OTC. Please Advise.

## 2015-06-10 NOTE — Telephone Encounter (Signed)
Patient notified and agreed.  

## 2015-06-11 DIAGNOSIS — Z471 Aftercare following joint replacement surgery: Secondary | ICD-10-CM | POA: Diagnosis not present

## 2015-06-11 DIAGNOSIS — E1122 Type 2 diabetes mellitus with diabetic chronic kidney disease: Secondary | ICD-10-CM | POA: Diagnosis not present

## 2015-06-11 DIAGNOSIS — E669 Obesity, unspecified: Secondary | ICD-10-CM | POA: Diagnosis not present

## 2015-06-11 DIAGNOSIS — I129 Hypertensive chronic kidney disease with stage 1 through stage 4 chronic kidney disease, or unspecified chronic kidney disease: Secondary | ICD-10-CM | POA: Diagnosis not present

## 2015-06-11 DIAGNOSIS — F419 Anxiety disorder, unspecified: Secondary | ICD-10-CM | POA: Diagnosis not present

## 2015-06-11 DIAGNOSIS — Z7984 Long term (current) use of oral hypoglycemic drugs: Secondary | ICD-10-CM | POA: Diagnosis not present

## 2015-06-11 DIAGNOSIS — Z96642 Presence of left artificial hip joint: Secondary | ICD-10-CM | POA: Diagnosis not present

## 2015-06-11 DIAGNOSIS — E11319 Type 2 diabetes mellitus with unspecified diabetic retinopathy without macular edema: Secondary | ICD-10-CM | POA: Diagnosis not present

## 2015-06-11 DIAGNOSIS — N189 Chronic kidney disease, unspecified: Secondary | ICD-10-CM | POA: Diagnosis not present

## 2015-06-15 DIAGNOSIS — M25552 Pain in left hip: Secondary | ICD-10-CM | POA: Diagnosis not present

## 2015-06-15 DIAGNOSIS — M1612 Unilateral primary osteoarthritis, left hip: Secondary | ICD-10-CM | POA: Diagnosis not present

## 2015-06-18 DIAGNOSIS — F419 Anxiety disorder, unspecified: Secondary | ICD-10-CM | POA: Diagnosis not present

## 2015-06-18 DIAGNOSIS — Z96642 Presence of left artificial hip joint: Secondary | ICD-10-CM | POA: Diagnosis not present

## 2015-06-18 DIAGNOSIS — Z7984 Long term (current) use of oral hypoglycemic drugs: Secondary | ICD-10-CM | POA: Diagnosis not present

## 2015-06-18 DIAGNOSIS — N189 Chronic kidney disease, unspecified: Secondary | ICD-10-CM | POA: Diagnosis not present

## 2015-06-18 DIAGNOSIS — E11319 Type 2 diabetes mellitus with unspecified diabetic retinopathy without macular edema: Secondary | ICD-10-CM | POA: Diagnosis not present

## 2015-06-18 DIAGNOSIS — Z471 Aftercare following joint replacement surgery: Secondary | ICD-10-CM | POA: Diagnosis not present

## 2015-06-18 DIAGNOSIS — E669 Obesity, unspecified: Secondary | ICD-10-CM | POA: Diagnosis not present

## 2015-06-18 DIAGNOSIS — I129 Hypertensive chronic kidney disease with stage 1 through stage 4 chronic kidney disease, or unspecified chronic kidney disease: Secondary | ICD-10-CM | POA: Diagnosis not present

## 2015-06-18 DIAGNOSIS — E1122 Type 2 diabetes mellitus with diabetic chronic kidney disease: Secondary | ICD-10-CM | POA: Diagnosis not present

## 2015-06-21 ENCOUNTER — Other Ambulatory Visit: Payer: Self-pay | Admitting: *Deleted

## 2015-06-21 ENCOUNTER — Other Ambulatory Visit: Payer: Self-pay | Admitting: Internal Medicine

## 2015-06-21 DIAGNOSIS — I10 Essential (primary) hypertension: Secondary | ICD-10-CM

## 2015-06-21 DIAGNOSIS — E11319 Type 2 diabetes mellitus with unspecified diabetic retinopathy without macular edema: Secondary | ICD-10-CM

## 2015-06-21 MED ORDER — LOSARTAN POTASSIUM 50 MG PO TABS: ORAL_TABLET | ORAL | Status: DC

## 2015-06-21 MED ORDER — GLIPIZIDE 5 MG PO TABS: ORAL_TABLET | ORAL | Status: DC

## 2015-06-21 NOTE — Telephone Encounter (Signed)
[facility] 

## 2015-07-16 ENCOUNTER — Telehealth: Payer: Self-pay

## 2015-07-16 NOTE — Telephone Encounter (Signed)
Message left on triage voicemail- patient thinks she had an allergic reaction to shrimp a few days ago and to fries possibly cooked in oil that shrimp was cooked in yesterday. Patient took benadryl and is fine. Patient would like to know if she can have an epi-pen if needed in the future. If yes please send to [facility] in [place]  Please advise

## 2015-07-17 ENCOUNTER — Other Ambulatory Visit: Payer: Self-pay | Admitting: Internal Medicine

## 2015-07-17 DIAGNOSIS — T7840XA Allergy, unspecified, initial encounter: Secondary | ICD-10-CM

## 2015-07-17 NOTE — Telephone Encounter (Signed)
Yes, she may have an epi-pen.  I put the order in, but could not find a [facility] in [place].  Is there one?

## 2015-07-19 MED ORDER — EPINEPHRINE 0.3 MG/0.3ML IJ SOAJ: 0.3000 mg | Freq: Once | INTRAMUSCULAR | Status: DC

## 2015-07-19 NOTE — Telephone Encounter (Signed)
RX sent, patient aware. [facility] was in system

## 2015-07-20 ENCOUNTER — Telehealth: Payer: Self-pay | Admitting: *Deleted

## 2015-07-20 NOTE — Telephone Encounter (Signed)
Patient called to change lab appointment for the same day as appointment with Dr. Mariea Clonts.

## 2015-08-02 ENCOUNTER — Other Ambulatory Visit: Payer: Self-pay | Admitting: Internal Medicine

## 2015-08-02 ENCOUNTER — Ambulatory Visit (INDEPENDENT_AMBULATORY_CARE_PROVIDER_SITE_OTHER): Payer: Medicare Other | Admitting: Internal Medicine

## 2015-08-02 ENCOUNTER — Encounter: Payer: Self-pay | Admitting: Internal Medicine

## 2015-08-02 ENCOUNTER — Other Ambulatory Visit: Payer: Medicare Other

## 2015-08-02 VITALS — BP 138/70 | HR 68 | Temp 98.3°F | Resp 20 | Wt 198.0 lb

## 2015-08-02 DIAGNOSIS — N183 Chronic kidney disease, stage 3 (moderate): Secondary | ICD-10-CM | POA: Diagnosis not present

## 2015-08-02 DIAGNOSIS — E785 Hyperlipidemia, unspecified: Secondary | ICD-10-CM

## 2015-08-02 DIAGNOSIS — N1831 Chronic kidney disease, stage 3a: Secondary | ICD-10-CM

## 2015-08-02 DIAGNOSIS — I10 Essential (primary) hypertension: Secondary | ICD-10-CM

## 2015-08-02 DIAGNOSIS — E119 Type 2 diabetes mellitus without complications: Secondary | ICD-10-CM

## 2015-08-02 DIAGNOSIS — R5383 Other fatigue: Secondary | ICD-10-CM

## 2015-08-02 DIAGNOSIS — E041 Nontoxic single thyroid nodule: Secondary | ICD-10-CM | POA: Diagnosis not present

## 2015-08-02 DIAGNOSIS — T783XXS Angioneurotic edema, sequela: Secondary | ICD-10-CM

## 2015-08-02 DIAGNOSIS — M1731 Unilateral post-traumatic osteoarthritis, right knee: Secondary | ICD-10-CM

## 2015-08-02 DIAGNOSIS — Z96642 Presence of left artificial hip joint: Secondary | ICD-10-CM

## 2015-08-02 DIAGNOSIS — D62 Acute posthemorrhagic anemia: Secondary | ICD-10-CM | POA: Diagnosis not present

## 2015-08-02 DIAGNOSIS — E11319 Type 2 diabetes mellitus with unspecified diabetic retinopathy without macular edema: Secondary | ICD-10-CM | POA: Diagnosis not present

## 2015-08-02 DIAGNOSIS — E781 Pure hyperglyceridemia: Secondary | ICD-10-CM

## 2015-08-02 DIAGNOSIS — Z96649 Presence of unspecified artificial hip joint: Secondary | ICD-10-CM

## 2015-08-02 MED ORDER — LIDOCAINE HCL (PF) 1 % IJ SOLN
10.0000 mL | Freq: Once | INTRAMUSCULAR | Status: AC
  Administered 2015-08-02: 10 mL via INTRADERMAL

## 2015-08-02 MED ORDER — METHYLPREDNISOLONE ACETATE 40 MG/ML IJ SUSP
40.0000 mg | Freq: Once | INTRAMUSCULAR | Status: AC
  Administered 2015-08-02: 40 mg via INTRAMUSCULAR

## 2015-08-02 NOTE — Progress Notes (Signed)
Patient ID: Julie Baxter, female   DOB: 1936-06-22, 79 y.o.   MRN: [id number]   Location: [place] Provider: Rexene Edison. Mariea Clonts, D.O., C.M.D. Full code Goals of Care: Advanced Directive information Does patient have an advance directive?: No, Would patient like information on creating an advanced directive?: Yes - Spiritual care consult ordered  Chief Complaint  Patient presents with  . Knee Pain    right knee pain. Had left hip replacement 06/01/15, she has been putting strain on right knee. Wants knee injection.    HPI: Patient is a 79 y.o. female seen in the office today for a right  knee injection--had left hip surgery and put strain on right knee.   Shot lasted 8 mos.  Didn't flare up until the surgery.    Has shrimp allergy.  Had swelling up to her nose from her lip and jaw hanging down and swollen like a chipmunk.  Carries benadryl.  Will not longer have deductible for epipen.  Had a burger that must have had fillers in it she says with some kind of shrimp-related oil.  Weight and bp better.  Had a period of hypotension and had to reduce medication.   Wants hip incision looked at.  Has a stitch and area irritated.  Her daughter put a steri strip on it and a bandaid due to it being under her abdominal fold.  Has a small area that his still not closed.    Review of Systems:  Review of Systems  Constitutional: Negative for fever, chills and malaise/fatigue.  HENT: Negative for hearing loss.   Eyes: Negative for blurred vision.  Respiratory: Negative for shortness of breath.   Cardiovascular: Negative for chest pain and leg swelling.  Gastrointestinal: Negative for abdominal pain and constipation.  Genitourinary: Negative for dysuria.  Musculoskeletal: Positive for joint pain. Negative for falls.  Skin: Negative for rash.       Left hip incision site  Neurological: Negative for dizziness, weakness and headaches.  Endo/Heme/Allergies: Does not bruise/bleed easily.    Psychiatric/Behavioral: Negative for depression and memory loss. The patient is nervous/anxious.     Past Medical History  Diagnosis Date  . Hypertension   . Arthritis   . Pain in joint, pelvic region and thigh   . Hyperpotassemia   . Osteoarthrosis, unspecified whether generalized or localized, unspecified site   . Overweight(278.02)   . Diabetic retinopathy ([place])   . Asymptomatic varicose veins   . Edema   . Unspecified disorder of kidney and ureter   . Unspecified disorder of kidney and ureter   . Pain in joint, lower leg   . Other specified disease of nail   . Urinary tract infection, site not specified   . Other and unspecified hyperlipidemia   . Other abnormal blood chemistry   . Other malaise and fatigue   . Undiagnosed cardiac murmurs   . PONV (postoperative nausea and vomiting)     pt. states that she was sick after hip surgery and oral surgery  . Peripheral vascular disease ([place])     varicose veins-  "Blood Clot anterior left LEG WITH PREGNANCY"  . Chronic kidney disease   . Anxiety     regarding surgery; occassionally pt. states  . Anxiety state, unspecified   . Insomnia, unspecified   . Multiple thyroid nodules   . Inflammation of joint of knee     right knee  . Hard of hearing   . Type II or unspecified type diabetes  mellitus with renal manifestations, not stated as uncontrolled     Past Surgical History  Procedure Laterality Date  . Abdominal hysterectomy    . Spinal meningitis  1957  . Tonsillectomy    . Lipoma excision      x2  bilateral buttocks  . Total hip arthroplasty Right 11/29/2012    Procedure: RIGHT TOTAL HIP ARTHROPLASTY ANTERIOR APPROACH;  Surgeon: Mcarthur Rossetti, MD;  Location: [facility];  Service: Orthopedics;  Laterality: Right;  . Eye surgery Bilateral     cataract extraction with IOL  . Total hip arthroplasty Left 06/01/2015    Procedure: LEFT TOTAL HIP ARTHROPLASTY ANTERIOR APPROACH;  Surgeon: Mcarthur Rossetti, MD;   Location: [facility];  Service: Orthopedics;  Laterality: Left;    Allergies  Allergen Reactions  . Naproxen Swelling and Other (See Comments)    Blacked out - Face swelling   . Sulfur Hives  . Tramadol Nausea And Vomiting      Medication List       This list is accurate as of: 08/02/15 11:02 AM.  Always use your most recent med list.               ACCU-CHEK SMARTVIEW test strip  Generic drug:  glucose blood  Use to check blood sugar  twice daily     acetaminophen 500 MG tablet  Commonly known as:  TYLENOL  Take 1,000 mg by mouth every 8 (eight) hours as needed for mild pain or moderate pain.     AMBULATORY NON FORMULARY MEDICATION  Medication Name:T/S ACCU-CHK Smartview STP Use to check blood sugar twice daily. Dx 250.00     AMBULATORY NON FORMULARY MEDICATION  Fastclix Lancets Sig: Use to test blood sugar twice daily. DX: E11.65     aspirin 325 MG EC tablet  Take 1 tablet (325 mg total) by mouth daily with breakfast.     atenolol 50 MG tablet  Commonly known as:  TENORMIN  Take one and 1/2 tablet by mouth twice daily     buPROPion 150 MG 24 hr tablet  Commonly known as:  WELLBUTRIN XL  Take 1 tablet (150 mg total) by mouth daily.     EPINEPHrine 0.3 mg/0.3 mL Soaj injection  Commonly known as:  EPI-PEN  Inject 0.3 mLs (0.3 mg total) into the muscle once.     furosemide 20 MG tablet  Commonly known as:  LASIX  Take 1 tablet by mouth  daily for blood pressure  and edema     glipiZIDE 5 MG tablet  Commonly known as:  GLUCOTROL  Take 1 tablet by mouth  twice a day before meals (breakfast and lunch)     losartan 50 MG tablet  Commonly known as:  COZAAR  Take one tablet twice daily to control blood pressure     metFORMIN 500 MG tablet  Commonly known as:  GLUCOPHAGE  Take 2 tablets by mouth two times daily with meals     methocarbamol 500 MG tablet  Commonly known as:  ROBAXIN  Take 1 tablet (500 mg total) by mouth every 6 (six) hours as needed for muscle  spasms.     OVER THE COUNTER MEDICATION  Take 3 tablets by mouth daily as needed. Restful legs     simvastatin 40 MG tablet  Commonly known as:  ZOCOR  Take 1 tablet by mouth  daily     SYSTANE BALANCE OP  Apply 1 drop to eye daily as needed (dry eyes).  Health Maintenance  Topic Date Due  . TETANUS/TDAP  04/11/1957  . DEXA SCAN  04/12/2003  . INFLUENZA VACCINE  10/27/2015 (Originally 03/29/2015)  . OPHTHALMOLOGY EXAM  10/15/2015  . HEMOGLOBIN A1C  10/24/2015  . FOOT EXAM  11/06/2015  . PNA vac Low Risk Adult (2 of 2 - PPSV23) 11/06/2015  . ZOSTAVAX  Addressed    Physical Exam: Filed Vitals:   08/02/15 1039  BP: 138/70  Pulse: 68  Temp: 98.3 F (36.8 C)  TempSrc: Oral  Resp: 20  Weight: 198 lb (89.812 kg)  SpO2: 98%   Body mass index is 33.97 kg/(m^2). Physical Exam  Constitutional: She is oriented to person, place, and time. She appears well-developed and well-nourished.  Cardiovascular: Normal rate, regular rhythm and normal heart sounds.   Pulmonary/Chest: Effort normal and breath sounds normal.  Abdominal: Soft. Bowel sounds are normal.  Musculoskeletal: She exhibits tenderness.  Right knee, difficulty walking on it, limps on the knee now  Neurological: She is alert and oriented to person, place, and time.  Skin: Skin is warm and dry.  Pinpoint opening at superior aspect of left hip replacement surgical site, no erythema, warmth, or drainage; steristrip/bandaid pulled skin off when removed    Labs reviewed: Basic Metabolic Panel:  Recent Labs  04/23/15 0806 05/21/15 1008 06/02/15 0505  NA 140 140 134*  K 4.5 4.8 4.1  CL 100 106 99*  CO2 23 27 25   GLUCOSE 129* 111* 201*  BUN 19 18 15   CREATININE 1.20* 1.32* 1.08*  CALCIUM 9.2 9.4 8.5*   Liver Function Tests:  Recent Labs  01/13/15 0834 04/23/15 0806  AST 13 14  ALT 12 10  ALKPHOS 67 67  BILITOT 0.5 0.5  PROT 6.8 6.4  ALBUMIN 4.0 4.0   No results for input(s): LIPASE, AMYLASE in  the last 8760 hours. No results for input(s): AMMONIA in the last 8760 hours. CBC:  Recent Labs  01/13/15 0834 02/04/15 0825 05/21/15 1008 06/02/15 0505  WBC 11.4* 10.3 10.7* 9.6  NEUTROABS 7.4* 6.0  --   --   HGB  --   --  12.3 11.3*  HCT 38.4 36.6 37.3 33.0*  MCV  --   --  93.0 91.9  PLT  --   --  220 181   Lipid Panel:  Recent Labs  01/13/15 0834 04/23/15 0806  CHOL 143 144  HDL 48 46  LDLCALC 56 65  TRIG 195* 166*  CHOLHDL 3.0 3.1   Lab Results  Component Value Date   HGBA1C 7.0* 04/23/2015   Assessment/Plan 1. Post-traumatic osteoarthritis of right knee -pain not responsive to oral medications (tylenol) and pt cannot tolerate nsaids -right knee cleansed with betadine x 2, sprayed with topical numbing spray, 1 cc lidocaine 1% and 1cc 40mg  depomedrol injected into knee joint via lateral approach -she tolerated procedure well  2. Status post total replacement of left hip -has small pinpoint area at top of incision -appears this just needs to still close -no signs of infection present -keep clean and dry  3. Essential hypertension -bp improved, apparently had been getting hypotensive and medications were adjusted by Dr. Nyoka Cowden  -doing better now with current regimen and bp even ok here where she usually has white coat syndrome  4. Diabetes mellitus without complication ([place]) -has been under better control lately -check hba1c today and recheck in 4 mos  5. Cystic thyroid nodule -f/u tsh due to this and her fatigue  6. Postoperative anemia due to  acute blood loss -got one unit transfusion and took iron for 7-10 days only -f/u cbc due to fatigue  7. Angioedema of lips, sequela -to shrimp -cont benadryl for these reactions, then epipen when severe  Labs/tests ordered:  Had cbc, cmp, flp, hba1c, tsh today Next appt:  4 mos for med mgt, labs before  Catie Chiao L. Kym Fenter, D.O. [facility] [address], [place] [address] Cell Phone (Mon-Fri 8am-5pm):  [phone] On Call:  [phone] & follow prompts after 5pm & weekends Office Phone:  [phone] Office Fax:  [phone]

## 2015-08-03 LAB — COMPREHENSIVE METABOLIC PANEL
ALT: 10 IU/L (ref 0–32)
AST: 15 IU/L (ref 0–40)
Albumin/Globulin Ratio: 1.7 (ref 1.1–2.5)
Albumin: 4 g/dL (ref 3.5–4.8)
Alkaline Phosphatase: 69 IU/L (ref 39–117)
BUN/Creatinine Ratio: 9 — ABNORMAL LOW (ref 11–26)
BUN: 10 mg/dL (ref 8–27)
Bilirubin Total: 0.3 mg/dL (ref 0.0–1.2)
CO2: 23 mmol/L (ref 18–29)
Calcium: 9.4 mg/dL (ref 8.7–10.3)
Chloride: 101 mmol/L (ref 97–106)
Creatinine, Ser: 1.07 mg/dL — ABNORMAL HIGH (ref 0.57–1.00)
GFR calc Af Amer: 58 mL/min/{1.73_m2} — ABNORMAL LOW (ref >59)
GFR calc non Af Amer: 50 mL/min/{1.73_m2} — ABNORMAL LOW (ref >59)
Globulin, Total: 2.4 g/dL (ref 1.5–4.5)
Glucose: 111 mg/dL — ABNORMAL HIGH (ref 65–99)
Potassium: 4.6 mmol/L (ref 3.5–5.2)
Sodium: 140 mmol/L (ref 136–144)
Total Protein: 6.4 g/dL (ref 6.0–8.5)

## 2015-08-03 LAB — CBC WITH DIFFERENTIAL/PLATELET
Basophils Absolute: 0.1 10*3/uL (ref 0.0–0.2)
Basos: 1 %
EOS (ABSOLUTE): 0.3 10*3/uL (ref 0.0–0.4)
Eos: 4 %
Hematocrit: 36.2 % (ref 34.0–46.6)
Hemoglobin: 11.9 g/dL (ref 11.1–15.9)
Immature Grans (Abs): 0 10*3/uL (ref 0.0–0.1)
Immature Granulocytes: 0 %
Lymphocytes Absolute: 2.1 10*3/uL (ref 0.7–3.1)
Lymphs: 24 %
MCH: 30.5 pg (ref 26.6–33.0)
MCHC: 32.9 g/dL (ref 31.5–35.7)
MCV: 93 fL (ref 79–97)
Monocytes Absolute: 1.2 10*3/uL — ABNORMAL HIGH (ref 0.1–0.9)
Monocytes: 13 %
Neutrophils Absolute: 5.3 10*3/uL (ref 1.4–7.0)
Neutrophils: 58 %
Platelets: 256 10*3/uL (ref 150–379)
RBC: 3.9 x10E6/uL (ref 3.77–5.28)
RDW: 13.7 % (ref 12.3–15.4)
WBC: 9 10*3/uL (ref 3.4–10.8)

## 2015-08-03 LAB — LIPID PANEL
Chol/HDL Ratio: 2.9 ratio units (ref 0.0–4.4)
Cholesterol, Total: 138 mg/dL (ref 100–199)
HDL: 48 mg/dL (ref >39)
LDL Calculated: 58 mg/dL (ref 0–99)
Triglycerides: 160 mg/dL — ABNORMAL HIGH (ref 0–149)
VLDL Cholesterol Cal: 32 mg/dL (ref 5–40)

## 2015-08-03 LAB — HEMOGLOBIN A1C
Est. average glucose Bld gHb Est-mCnc: 143 mg/dL
Hgb A1c MFr Bld: 6.6 % — ABNORMAL HIGH (ref 4.8–5.6)

## 2015-08-03 LAB — TSH: TSH: 1.71 u[IU]/mL (ref 0.450–4.500)

## 2015-08-31 ENCOUNTER — Other Ambulatory Visit: Payer: Self-pay

## 2015-09-02 ENCOUNTER — Ambulatory Visit: Payer: Self-pay | Admitting: Internal Medicine

## 2015-09-27 DIAGNOSIS — H35361 Drusen (degenerative) of macula, right eye: Secondary | ICD-10-CM | POA: Diagnosis not present

## 2015-09-27 DIAGNOSIS — E113211 Type 2 diabetes mellitus with mild nonproliferative diabetic retinopathy with macular edema, right eye: Secondary | ICD-10-CM | POA: Diagnosis not present

## 2015-09-27 DIAGNOSIS — H35043 Retinal micro-aneurysms, unspecified, bilateral: Secondary | ICD-10-CM | POA: Diagnosis not present

## 2015-09-27 DIAGNOSIS — E113212 Type 2 diabetes mellitus with mild nonproliferative diabetic retinopathy with macular edema, left eye: Secondary | ICD-10-CM | POA: Diagnosis not present

## 2015-09-27 LAB — HM DIABETES EYE EXAM

## 2015-10-12 ENCOUNTER — Other Ambulatory Visit: Payer: Self-pay | Admitting: Internal Medicine

## 2015-11-02 ENCOUNTER — Ambulatory Visit (INDEPENDENT_AMBULATORY_CARE_PROVIDER_SITE_OTHER): Payer: Medicare Other | Admitting: Family Medicine

## 2015-11-02 VITALS — BP 108/74 | HR 89 | Temp 99.6°F | Resp 18

## 2015-11-02 DIAGNOSIS — M436 Torticollis: Secondary | ICD-10-CM | POA: Diagnosis not present

## 2015-11-02 DIAGNOSIS — R42 Dizziness and giddiness: Secondary | ICD-10-CM | POA: Diagnosis not present

## 2015-11-02 DIAGNOSIS — S0180XA Unspecified open wound of other part of head, initial encounter: Secondary | ICD-10-CM

## 2015-11-02 MED ORDER — PREDNISONE 20 MG PO TABS: ORAL_TABLET | ORAL | Status: DC

## 2015-11-02 NOTE — Progress Notes (Signed)
Patient ID: Julie Baxter, female   DOB: [birth year], 80 y.o.   MRN: [id number]  By signing my name below, I, Essence Howell, attest that this documentation has been prepared under the direction and in the presence of Robyn Haber, MD Electronically Signed: Ladene Artist, ED Scribe 11/02/2015 at 9:51 AM.  Patient ID: Julie Baxter MRN: [id number], DOB: 03-02-1936, 80 y.o. Date of Encounter: 11/02/2015, 9:41 AM  Primary Physician: Hollace Kinnier, DO  Chief Complaint:  Chief Complaint  Patient presents with  . Neck Pain    x 2 days  . Fall    today, head injury, no loss of consciousness   HPI: 80 y.o. year old female with history below presents with a fall that occurred PTA. Pt denies LOC. She states that she fell in the kitchen and struck the corner of her countertop this morning. She reports a laceration below her right nostril, above her right eye and an abrasion to her right forearm. Pt has applied Neosporin and sterile dressings to the area. Her last tdap is unknown. She denies HA, double vision, hearing disturbances, back pain, hip pain, leg pain.   Neck Pain Pt also reports persistent neck pain onset 2 days ago. She states that she woke up with a crook in her neck 2 days ago. Pain is worsened with movement. She has tried ice, heat and a neck brace which she is wearing at this time with mild relief.   Diabetes  Pt reports that her last A1C was 6.8.  Past Medical History  Diagnosis Date  . Hypertension   . Arthritis   . Pain in joint, pelvic region and thigh   . Hyperpotassemia   . Osteoarthrosis, unspecified whether generalized or localized, unspecified site   . Overweight(278.02)   . Diabetic retinopathy ([place])   . Asymptomatic varicose veins   . Edema   . Unspecified disorder of kidney and ureter   . Unspecified disorder of kidney and ureter   . Pain in joint, lower leg   . Other specified disease of nail   . Urinary tract infection, site not specified   . Other and  unspecified hyperlipidemia   . Other abnormal blood chemistry   . Other malaise and fatigue   . Undiagnosed cardiac murmurs   . PONV (postoperative nausea and vomiting)     pt. states that she was sick after hip surgery and oral surgery  . Peripheral vascular disease ([place])     varicose veins-  "Blood Clot anterior left LEG WITH PREGNANCY"  . Chronic kidney disease   . Anxiety     regarding surgery; occassionally pt. states  . Anxiety state, unspecified   . Insomnia, unspecified   . Multiple thyroid nodules   . Inflammation of joint of knee     right knee  . Hard of hearing   . Type II or unspecified type diabetes mellitus with renal manifestations, not stated as uncontrolled     Home Meds: Prior to Admission medications   Medication Sig Start Date End Date Taking? Authorizing Provider  ACCU-CHEK SMARTVIEW test strip Use to check blood sugar  twice daily 03/24/15  Yes Tiffany L Reed, DO  acetaminophen (TYLENOL) 500 MG tablet Take 1,000 mg by mouth every 8 (eight) hours as needed for mild pain or moderate pain.   Yes Historical Provider, MD  AMBULATORY NON FORMULARY MEDICATION Medication Name:T/S ACCU-CHK Smartview STP Use to check blood sugar twice daily. Dx 250.00 03/13/14  Yes [place], DO  AMBULATORY NON FORMULARY MEDICATION Fastclix Lancets Sig: Use to test blood sugar twice daily. DX: E11.65 01/14/15  Yes Tiffany L Reed, DO  aspirin EC 325 MG EC tablet Take 1 tablet (325 mg total) by mouth daily with breakfast. 06/03/15  Yes Mcarthur Rossetti, MD  atenolol (TENORMIN) 50 MG tablet Take one and 1/2 tablet by mouth twice daily 05/06/15  Yes Tiffany L Reed, DO  buPROPion (WELLBUTRIN XL) 150 MG 24 hr tablet Take 1 tablet (150 mg total) by mouth daily. Patient taking differently: Take 150 mg by mouth daily as needed (ONLY TAKES WHEN NECESSARY (STRESS)).  03/05/15  Yes Tiffany L Reed, DO  EPINEPHrine 0.3 mg/0.3 mL IJ SOAJ injection Inject 0.3 mLs (0.3 mg total) into the muscle once.  07/19/15  Yes Tiffany L Reed, DO  furosemide (LASIX) 20 MG tablet Take 1 tablet by mouth  daily for blood pressure  and edema 06/21/15  Yes Tiffany L Reed, DO  glipiZIDE (GLUCOTROL) 5 MG tablet Take 1 tablet by mouth  twice a day before meals (breakfast and lunch) 06/21/15  Yes Tiffany L Reed, DO  losartan (COZAAR) 50 MG tablet Take one tablet twice daily to control blood pressure 06/21/15  Yes Tiffany L Reed, DO  metFORMIN (GLUCOPHAGE) 500 MG tablet Take 2 tablets by mouth two times daily with meals 04/29/15  Yes Tiffany L Reed, DO  methocarbamol (ROBAXIN) 500 MG tablet Take 1 tablet (500 mg total) by mouth every 6 (six) hours as needed for muscle spasms. 06/03/15  Yes Mcarthur Rossetti, MD  OVER THE COUNTER MEDICATION Take 3 tablets by mouth daily as needed. Restful legs   Yes Historical Provider, MD  Propylene Glycol (SYSTANE BALANCE OP) Apply 1 drop to eye daily as needed (dry eyes).   Yes Historical Provider, MD  simvastatin (ZOCOR) 40 MG tablet Take 1 tablet by mouth  daily 10/12/15  Yes Tiffany L Reed, DO   Allergies:  Allergies  Allergen Reactions  . Naproxen Swelling and Other (See Comments)    Blacked out - Face swelling   . Sulfur Hives  . Tramadol Nausea And Vomiting    Social History   Social History  . Marital Status: Single    Spouse Name: N/A  . Number of Children: N/A  . Years of Education: N/A   Occupational History  . Not on file.   Social History Main Topics  . Smoking status: Never Smoker   . Smokeless tobacco: Never Used  . Alcohol Use: No  . Drug Use: No  . Sexual Activity: Not on file   Other Topics Concern  . Not on file   Social History Narrative    Review of Systems: Constitutional: negative for chills, fever, night sweats, weight changes, or fatigue  HEENT: negative for vision changes, hearing loss, congestion, rhinorrhea, ST, epistaxis, or sinus pressure Cardiovascular: negative for chest pain or palpitations Respiratory: negative for  hemoptysis, wheezing, shortness of breath, or cough Abdominal: negative for abdominal pain, nausea, vomiting, diarrhea, or constipation Msk: negative for back pain, arthralgias, +neck pain Dermatological: negative for rash, +wound Neurologic: negative for headache, dizziness, or syncope All other systems reviewed and are otherwise negative with the exception to those above and in the HPI.  Physical Exam: Blood pressure 108/74, pulse 89, temperature 99.6 F (37.6 C), resp. rate 18, SpO2 98 %., There is no weight on file to calculate BMI. General: Well developed, well nourished, in no acute distress. Head: 1 cm laceration on the R upper eyelid,  1 cm laceration in the nasal labia fold, but of which are full thickness. Eyes without discharge, sclera non-icteric, nares are without discharge. Bilateral auditory canals clear, TM's are without perforation, pearly grey and translucent with reflective cone of light bilaterally. Oral cavity moist, posterior pharynx without exudate, erythema, peritonsillar abscess, or post nasal drip.  Neck: Supple. No thyromegaly. Full ROM. No lymphadenopathy. Lungs: Clear bilaterally to auscultation without wheezes, rales, or rhonchi. Breathing is unlabored. Heart: RRR with S1 S2. No murmurs, rubs, or gallops appreciated. Abdomen: Soft, non-tender, non-distended with normoactive bowel sounds. No hepatomegaly. No rebound/guarding. No obvious abdominal masses. Msk:  Strength and tone normal for age. Extremities/Skin: Abrasion on R arm with underlying ecchymosis. Warm and dry. No clubbing or cyanosis. No edema. No rashes or suspicious lesions.  Neuro: Alert and oriented X 3. Moves all extremities spontaneously. Gait is normal. CNII-XII grossly in tact. Psych:  Responds to questions appropriately with a normal affect.   Labs:  ASSESSMENT AND PLAN:  80 y.o. year old female with  No diagnosis found.  This chart was scribed in my presence and reviewed by me personally.     ICD-9-CM ICD-10-CM   1. Torticollis, acute 723.5 M43.6 predniSONE (DELTASONE) 20 MG tablet  2. Dizziness 780.4 R42   3. Open facial wound, initial encounter 873.40 S01.80XA      Signed, Robyn Haber, MD 11/02/2015 9:41 AM

## 2015-12-03 ENCOUNTER — Telehealth: Payer: Self-pay | Admitting: *Deleted

## 2015-12-03 NOTE — Telephone Encounter (Signed)
Pt calling asking for abx to be called in for urinary urgency, I advised pt that she would need to be seen, per pt she will try the AZO over the weekend and call on Monday if not better.

## 2015-12-10 ENCOUNTER — Other Ambulatory Visit: Payer: Self-pay | Admitting: Internal Medicine

## 2015-12-16 ENCOUNTER — Ambulatory Visit (INDEPENDENT_AMBULATORY_CARE_PROVIDER_SITE_OTHER): Payer: Medicare Other | Admitting: Internal Medicine

## 2015-12-16 ENCOUNTER — Encounter: Payer: Self-pay | Admitting: Internal Medicine

## 2015-12-16 VITALS — BP 110/70 | HR 67 | Temp 97.6°F | Resp 20 | Ht 64.0 in | Wt 202.0 lb

## 2015-12-16 DIAGNOSIS — I10 Essential (primary) hypertension: Secondary | ICD-10-CM | POA: Insufficient documentation

## 2015-12-16 DIAGNOSIS — E781 Pure hyperglyceridemia: Secondary | ICD-10-CM | POA: Diagnosis not present

## 2015-12-16 DIAGNOSIS — E11319 Type 2 diabetes mellitus with unspecified diabetic retinopathy without macular edema: Secondary | ICD-10-CM | POA: Diagnosis not present

## 2015-12-16 DIAGNOSIS — M1731 Unilateral post-traumatic osteoarthritis, right knee: Secondary | ICD-10-CM | POA: Insufficient documentation

## 2015-12-16 DIAGNOSIS — E785 Hyperlipidemia, unspecified: Secondary | ICD-10-CM

## 2015-12-16 DIAGNOSIS — R300 Dysuria: Secondary | ICD-10-CM

## 2015-12-16 DIAGNOSIS — D62 Acute posthemorrhagic anemia: Secondary | ICD-10-CM

## 2015-12-16 DIAGNOSIS — I952 Hypotension due to drugs: Secondary | ICD-10-CM | POA: Diagnosis not present

## 2015-12-16 DIAGNOSIS — E08319 Diabetes mellitus due to underlying condition with unspecified diabetic retinopathy without macular edema: Secondary | ICD-10-CM | POA: Insufficient documentation

## 2015-12-16 DIAGNOSIS — E119 Type 2 diabetes mellitus without complications: Secondary | ICD-10-CM

## 2015-12-16 DIAGNOSIS — R42 Dizziness and giddiness: Secondary | ICD-10-CM | POA: Diagnosis not present

## 2015-12-16 LAB — POCT URINALYSIS DIPSTICK
Bilirubin, UA: NEGATIVE
Glucose, UA: NEGATIVE
Ketones, UA: NEGATIVE
Nitrite, UA: POSITIVE
Protein, UA: 30
Spec Grav, UA: 1.015
Urobilinogen, UA: NEGATIVE
pH, UA: 6.5

## 2015-12-16 MED ORDER — ATENOLOL 50 MG PO TABS: ORAL_TABLET | ORAL | Status: DC

## 2015-12-16 NOTE — Progress Notes (Signed)
Patient ID: Julie Baxter, female   DOB: 01-22-1936, 80 y.o.   MRN: [id number]   Location:  [facility] clinic Provider:  Daven Montz L. Mariea Clonts, D.O., C.M.D.  Code Status: DNR Goals of Care:  Advanced Directives 12/16/2015  Does patient have an advance directive? Yes  Type of Advance Directive [place]  Does patient want to make changes to advanced directive? No - Patient declined  Copy of advanced directive(s) in chart? Yes  Would patient like information on creating an advanced directive? -     Chief Complaint  Patient presents with  . Medical Management of Chronic Issues    dizziness, knee pain    HPI: Patient is a 80 y.o. female seen today for medical mgt of chronic diseases.   She refused to come ahead for her labs so we could review them at her appt.    She is having dizziness and hypotension in the evenings since Dr. Nyoka Cowden added a new medication for hypertension to her regimen. This was after her bp was high at the dentist in 03/03/15.  She was very anxious about going to the dentist.  When she saw Dr. Nyoka Cowden that day, her bp was only 138/82.  It was 160/85 in sept at an appt.  Otherwise, it's been 100s to 130s here and at other places and at home, 80s to 110s over 50s to 60s.  Worse at night.  Had to lay head down with her dizziness.  Sitting to standing in church will bring it on.  Also sometimes when walking with the grocery cart, she'll feel like that and she has to put her head down to prevent falling or blacking out.    She also has a long list of concerns:   Urine infection?  Still has some dysuria.  Did take azo for 4 days.  Has been drinking cranberry juice and apple juice.  Then she thought it was a yeast infection.  Tired and no energy for 2 wks.  "slight temp" during the UTI period 99.5-100.stg but that only lasted a day.  Usually is 97s.  Had torticollis, and was treated with prednisone.  It was so bad she couldn't turn it either way.  Had been using tylenol  ES, a pain pill from ortho, muscle relaxer and tylenol PM.  Then had a fall from the medication, she thinks, 2 wks ago.  Was walking in kitchen, started falling forward--felt like she was drunk, got stitches in her forehead and right cheek, still has some swelling in her lip.    Also had toothache. Had a bad cold.    Had harsh pain in her knee.  It burns like there is a torch put to it.  First shot last 8 mos, next lasted 2 mos.  Has tried ice, heat, wraps.  Discussed that she had moderate tricompartmental arthritis in 2014.  Has had to take a little advil (two one day), one some other days.    Says she has been irritable with her husband.    Counseled on no tylenol PM, no muscle relaxers except one at night, but she doesn't need them now.    She says she doesn't stress anymore.  Her husband disagrees with that and she's always been anxious.    She is concerned about her glucose after the prednisone pills and shot.  She has been making herself eat.  Unclear what her sugar has been:  Double digits in the am, says low at night, skips second  pill sometimes.  Hasn't brought her meter.    Past Medical History  Diagnosis Date  . Hypertension   . Arthritis   . Pain in joint, pelvic region and thigh   . Hyperpotassemia   . Osteoarthrosis, unspecified whether generalized or localized, unspecified site   . Overweight(278.02)   . Diabetic retinopathy ([place])   . Asymptomatic varicose veins   . Edema   . Unspecified disorder of kidney and ureter   . Unspecified disorder of kidney and ureter   . Pain in joint, lower leg   . Other specified disease of nail   . Urinary tract infection, site not specified   . Other and unspecified hyperlipidemia   . Other abnormal blood chemistry   . Other malaise and fatigue   . Undiagnosed cardiac murmurs   . PONV (postoperative nausea and vomiting)     pt. states that she was sick after hip surgery and oral surgery  . Peripheral vascular disease ([place])      varicose veins-  "Blood Clot anterior left LEG WITH PREGNANCY"  . Chronic kidney disease   . Anxiety     regarding surgery; occassionally pt. states  . Anxiety state, unspecified   . Insomnia, unspecified   . Multiple thyroid nodules   . Inflammation of joint of knee     right knee  . Hard of hearing   . Type II or unspecified type diabetes mellitus with renal manifestations, not stated as uncontrolled     Past Surgical History  Procedure Laterality Date  . Abdominal hysterectomy    . Spinal meningitis  1957  . Tonsillectomy    . Lipoma excision      x2  bilateral buttocks  . Total hip arthroplasty Right 11/29/2012    Procedure: RIGHT TOTAL HIP ARTHROPLASTY ANTERIOR APPROACH;  Surgeon: Mcarthur Rossetti, MD;  Location: [facility];  Service: Orthopedics;  Laterality: Right;  . Eye surgery Bilateral     cataract extraction with IOL  . Total hip arthroplasty Left 06/01/2015    Procedure: LEFT TOTAL HIP ARTHROPLASTY ANTERIOR APPROACH;  Surgeon: Mcarthur Rossetti, MD;  Location: [facility];  Service: Orthopedics;  Laterality: Left;    Allergies  Allergen Reactions  . Naproxen Swelling and Other (See Comments)    Blacked out - Face swelling   . Sulfur Hives  . Tramadol Nausea And Vomiting      Medication List       This list is accurate as of: 12/16/15  7:50 AM.  Always use your most recent med list.               ACCU-CHEK SMARTVIEW test strip  Generic drug:  glucose blood  Use to check blood sugar  twice daily     acetaminophen 500 MG tablet  Commonly known as:  TYLENOL  Take 1,000 mg by mouth every 8 (eight) hours as needed for mild pain or moderate pain.     AMBULATORY NON FORMULARY MEDICATION  Medication Name:T/S ACCU-CHK Smartview STP Use to check blood sugar twice daily. Dx 250.00     AMBULATORY NON FORMULARY MEDICATION  Fastclix Lancets Sig: Use to test blood sugar twice daily. DX: E11.65     aspirin 325 MG EC tablet  Take 1 tablet (325 mg total) by mouth  daily with breakfast.     atenolol 50 MG tablet  Commonly known as:  TENORMIN  Take one and 1/2 tablet by mouth twice daily     buPROPion 150 MG  24 hr tablet  Commonly known as:  WELLBUTRIN XL  Take 1 tablet (150 mg total) by mouth daily.     EPINEPHrine 0.3 mg/0.3 mL Soaj injection  Commonly known as:  EPI-PEN  Inject 0.3 mLs (0.3 mg total) into the muscle once.     furosemide 20 MG tablet  Commonly known as:  LASIX  Take 1 tablet by mouth  daily for blood pressure  and edema     glipiZIDE 5 MG tablet  Commonly known as:  GLUCOTROL  Take 1 tablet by mouth  twice a day before meals (breakfast and lunch)     losartan 50 MG tablet  Commonly known as:  COZAAR  Take one tablet twice daily to control blood pressure     metFORMIN 500 MG tablet  Commonly known as:  GLUCOPHAGE  Take 2 tablets by mouth two times daily with meals     methocarbamol 500 MG tablet  Commonly known as:  ROBAXIN  Take 1 tablet (500 mg total) by mouth every 6 (six) hours as needed for muscle spasms.     OVER THE COUNTER MEDICATION  Take 3 tablets by mouth daily as needed. Restful legs     predniSONE 20 MG tablet  Commonly known as:  DELTASONE  Two daily with food     simvastatin 40 MG tablet  Commonly known as:  ZOCOR  Take 1 tablet by mouth  daily     SYSTANE BALANCE OP  Apply 1 drop to eye daily as needed (dry eyes).       Review of Systems:  Review of Systems  Constitutional: Positive for malaise/fatigue. Negative for fever, chills and weight loss.  HENT: Negative for congestion and hearing loss.   Eyes: Negative for blurred vision.  Respiratory: Negative for cough and shortness of breath.   Cardiovascular: Negative for chest pain and leg swelling.  Gastrointestinal: Negative for abdominal pain, constipation, blood in stool and melena.  Genitourinary: Positive for dysuria. Negative for urgency, frequency, hematuria and flank pain.  Musculoskeletal: Positive for joint pain and falls.    Skin: Negative for rash.  Neurological: Positive for dizziness, tingling and sensory change. Negative for focal weakness, seizures, loss of consciousness, weakness and headaches.  Psychiatric/Behavioral: Negative for depression and memory loss. The patient is nervous/anxious.     Health Maintenance  Topic Date Due  . TETANUS/TDAP  04/11/1957  . DEXA SCAN  04/12/2003  . OPHTHALMOLOGY EXAM  10/15/2015  . FOOT EXAM  11/06/2015  . PNA vac Low Risk Adult (2 of 2 - PPSV23) 11/06/2015  . HEMOGLOBIN A1C  01/31/2016  . INFLUENZA VACCINE  03/28/2016  . ZOSTAVAX  Addressed    Physical Exam: Filed Vitals:   12/16/15 0744  BP: 132/80  Pulse: 72  Temp: 97.6 F (36.4 C)  TempSrc: Oral  Resp: 20  Height: 5\' 4"  (1.626 m)  Weight: 202 lb (91.627 kg)  SpO2: 97%   Body mass index is 34.66 kg/(m^2). Physical Exam  Constitutional: She is oriented to person, place, and time. She appears well-developed and well-nourished. No distress.  Cardiovascular: Normal rate, regular rhythm, normal heart sounds and intact distal pulses.   Pulmonary/Chest: Effort normal and breath sounds normal. No respiratory distress.  Abdominal: Soft. Bowel sounds are normal. She exhibits no distension and no mass. There is no tenderness.  Musculoskeletal: Normal range of motion. She exhibits tenderness.  Tenderness, warmth, edema of right knee  Neurological: She is alert and oriented to person, place, and time.  Skin: Skin is warm and dry.  Psychiatric:  Anxious, jittery, talks fast    Labs reviewed: Basic Metabolic Panel:  Recent Labs  05/21/15 1008 06/02/15 0505 08/02/15 0937 08/02/15 0939  NA 140 134* 140  --   K 4.8 4.1 4.6  --   CL 106 99* 101  --   CO2 27 25 23   --   GLUCOSE 111* 201* 111*  --   BUN 18 15 10   --   CREATININE 1.32* 1.08* 1.07*  --   CALCIUM 9.4 8.5* 9.4  --   TSH  --   --   --  1.710   Liver Function Tests:  Recent Labs  01/13/15 0834 04/23/15 0806 08/02/15 0937  AST 13 14  15   ALT 12 10 10   ALKPHOS 67 67 69  BILITOT 0.5 0.5 0.3  PROT 6.8 6.4 6.4  ALBUMIN 4.0 4.0 4.0   No results for input(s): LIPASE, AMYLASE in the last 8760 hours. No results for input(s): AMMONIA in the last 8760 hours. CBC:  Recent Labs  01/13/15 0834 02/04/15 0825 05/21/15 1008 06/02/15 0505 08/02/15 0937  WBC 11.4* 10.3 10.7* 9.6 9.0  NEUTROABS 7.4* 6.0  --   --  5.3  HGB  --   --  12.3 11.3*  --   HCT 38.4 36.6 37.3 33.0* 36.2  MCV 94 92 93.0 91.9 93  PLT 206 257 220 181 256   Lipid Panel:  Recent Labs  01/13/15 0834 04/23/15 0806 08/02/15 0937  CHOL 143 144 138  HDL 48 46 48  LDLCALC 56 65 58  TRIG 195* 166* 160*  CHOLHDL 3.0 3.1 2.9   Lab Results  Component Value Date   HGBA1C 6.6* 08/02/2015    Assessment/Plan 1. Hypotension due to drugs - Orthostatic vital signs were not remarkable but she was dizzy on positional change -due to recorded readings from home and recent fall, will reduce atenolol to 25mg  po bid and continue current losartan as ordered -hopefully this will help - atenolol (TENORMIN) 50 MG tablet; Take 1/2 tablet by mouth twice daily  Dispense: 270 tablet; Refill: 3  2. Orthostatic dizziness - Orthostatic vital signs were negative, but pt symptomatic so bp medication reduced  3. Dysuria - Urine culture - POC Urinalysis Dipstick was positive -did not have other symptoms so await culture  4. Type 2 diabetes mellitus with diabetic retinopathy, macular edema presence unspecified, with unspecified retinopathy severity -needs labs to see where this stands -says she checks her sugars, but does not really give me any numbers and her husband could not confirm that she was checking -she also skips her evening medications at times and is snacking to avoid lows, but I don't know when she gets lows, how often and when she is too high b/c she did not bring her book or meter so advised to please do so -cont same meds and await hba1c  5.  Hypertriglyceridemia -f/u labs, says she is eating healthily -not doing much exercise due to knee pain  6. Post-traumatic osteoarthritis of right knee - already had moderate OA of all compartments 3 years ago and pain and walking worse now so suspect she has end stage djd now - agreed to some PT for exercises and therapeutic modalities, and also might go back to ortho for hyalgan/cartilage shots since steroid shots are lasting only 2 mos now and she's diabetic and should avoid them when possible - Ambulatory referral to Physical Therapy  8. Essential hypertension -  bp was too low in the evening especially and she's been dizzy so atenolol reduced - Basic metabolic panel  9. Postoperative anemia due to acute blood loss - f/u labs to see if this is improved due to fatigue - CBC with Differential/Platelet    Labs/tests ordered:   Orders Placed This Encounter  Procedures  . Urine culture  . Ambulatory referral to Physical Therapy    Referral Priority:  Routine    Referral Type:  Physical Medicine    Referral Reason:  Specialty Services Required    Requested Specialty:  Physical Therapy    Number of Visits Requested:  1  . Orthostatic vital signs    bp and pulse in three positions  . POC Urinalysis Dipstick   Next appt:  03/16/2016 for med mgt   Julie Baxter, D.O. [facility] [address], [place] [address] Cell Phone (Mon-Fri 8am-5pm):  [phone] On Call:  [phone] & follow prompts after 5pm & weekends Office Phone:  [phone] Office Fax:  [phone]

## 2015-12-16 NOTE — Patient Instructions (Signed)
Please bring your glucose meter into the office and start keeping a paper record for me.

## 2015-12-17 LAB — HEMOGLOBIN A1C
Est. average glucose Bld gHb Est-mCnc: 140 mg/dL
Hgb A1c MFr Bld: 6.5 % — ABNORMAL HIGH (ref 4.8–5.6)

## 2015-12-17 LAB — CBC WITH DIFFERENTIAL/PLATELET
Basophils Absolute: 0.1 10*3/uL (ref 0.0–0.2)
Basos: 1 %
EOS (ABSOLUTE): 0.3 10*3/uL (ref 0.0–0.4)
Eos: 2 %
Hematocrit: 37.8 % (ref 34.0–46.6)
Hemoglobin: 12.6 g/dL (ref 11.1–15.9)
Immature Grans (Abs): 0.1 10*3/uL (ref 0.0–0.1)
Immature Granulocytes: 1 %
Lymphocytes Absolute: 2.1 10*3/uL (ref 0.7–3.1)
Lymphs: 20 %
MCH: 30.7 pg (ref 26.6–33.0)
MCHC: 33.3 g/dL (ref 31.5–35.7)
MCV: 92 fL (ref 79–97)
Monocytes Absolute: 1 10*3/uL — ABNORMAL HIGH (ref 0.1–0.9)
Monocytes: 9 %
Neutrophils Absolute: 7.1 10*3/uL — ABNORMAL HIGH (ref 1.4–7.0)
Neutrophils: 67 %
Platelets: 275 10*3/uL (ref 150–379)
RBC: 4.1 x10E6/uL (ref 3.77–5.28)
RDW: 14.6 % (ref 12.3–15.4)
WBC: 10.5 10*3/uL (ref 3.4–10.8)

## 2015-12-17 LAB — BASIC METABOLIC PANEL
BUN/Creatinine Ratio: 15 (ref 12–28)
BUN: 17 mg/dL (ref 8–27)
CO2: 24 mmol/L (ref 18–29)
Calcium: 9.8 mg/dL (ref 8.7–10.3)
Chloride: 104 mmol/L (ref 96–106)
Creatinine, Ser: 1.11 mg/dL — ABNORMAL HIGH (ref 0.57–1.00)
GFR calc Af Amer: 55 mL/min/{1.73_m2} — ABNORMAL LOW (ref >59)
GFR calc non Af Amer: 48 mL/min/{1.73_m2} — ABNORMAL LOW (ref >59)
Glucose: 144 mg/dL — ABNORMAL HIGH (ref 65–99)
Potassium: 5.1 mmol/L (ref 3.5–5.2)
Sodium: 142 mmol/L (ref 134–144)

## 2015-12-17 LAB — LIPID PANEL
Chol/HDL Ratio: 2.7 ratio units (ref 0.0–4.4)
Cholesterol, Total: 131 mg/dL (ref 100–199)
HDL: 48 mg/dL (ref >39)
LDL Calculated: 53 mg/dL (ref 0–99)
Triglycerides: 151 mg/dL — ABNORMAL HIGH (ref 0–149)
VLDL Cholesterol Cal: 30 mg/dL (ref 5–40)

## 2015-12-18 LAB — URINE CULTURE

## 2015-12-20 ENCOUNTER — Encounter: Payer: Self-pay | Admitting: *Deleted

## 2015-12-20 ENCOUNTER — Other Ambulatory Visit: Payer: Self-pay | Admitting: Internal Medicine

## 2015-12-20 MED ORDER — AMOXICILLIN 250 MG PO CAPS: 250.0000 mg | ORAL_CAPSULE | Freq: Three times a day (TID) | ORAL | Status: AC

## 2015-12-23 ENCOUNTER — Ambulatory Visit: Payer: Self-pay | Admitting: Internal Medicine

## 2015-12-24 ENCOUNTER — Other Ambulatory Visit: Payer: Self-pay

## 2015-12-24 DIAGNOSIS — I952 Hypotension due to drugs: Secondary | ICD-10-CM

## 2015-12-24 MED ORDER — ATENOLOL 50 MG PO TABS: ORAL_TABLET | ORAL | Status: DC

## 2015-12-30 ENCOUNTER — Telehealth: Payer: Self-pay | Admitting: Internal Medicine

## 2015-12-30 NOTE — Telephone Encounter (Signed)
FYI, Called and spoke to patient to follow up on attempts from Physical Therapy to schedule an appointment. Patient stated she has an appointment with Dr. Ninfa Linden (her orthopedist) on 01/12/16 and he is going to do X-ray's and give her a gel shot so she has decided to hold off on the physical therapy for now.

## 2015-12-30 NOTE — Telephone Encounter (Signed)
Noted  

## 2016-01-12 DIAGNOSIS — M1711 Unilateral primary osteoarthritis, right knee: Secondary | ICD-10-CM | POA: Diagnosis not present

## 2016-02-17 ENCOUNTER — Other Ambulatory Visit: Payer: Self-pay | Admitting: Internal Medicine

## 2016-03-09 ENCOUNTER — Other Ambulatory Visit: Payer: Self-pay | Admitting: Internal Medicine

## 2016-03-16 ENCOUNTER — Ambulatory Visit: Payer: Medicare Other | Admitting: Internal Medicine

## 2016-03-23 ENCOUNTER — Ambulatory Visit: Payer: Medicare Other | Admitting: Internal Medicine

## 2016-04-01 ENCOUNTER — Other Ambulatory Visit: Payer: Self-pay | Admitting: Internal Medicine

## 2016-04-10 ENCOUNTER — Encounter: Payer: Self-pay | Admitting: Internal Medicine

## 2016-04-10 ENCOUNTER — Ambulatory Visit (INDEPENDENT_AMBULATORY_CARE_PROVIDER_SITE_OTHER): Payer: Medicare Other | Admitting: Internal Medicine

## 2016-04-10 VITALS — BP 140/90 | HR 68 | Temp 97.9°F | Wt 204.0 lb

## 2016-04-10 DIAGNOSIS — M1731 Unilateral post-traumatic osteoarthritis, right knee: Secondary | ICD-10-CM

## 2016-04-10 DIAGNOSIS — E11319 Type 2 diabetes mellitus with unspecified diabetic retinopathy without macular edema: Secondary | ICD-10-CM | POA: Diagnosis not present

## 2016-04-10 DIAGNOSIS — R42 Dizziness and giddiness: Secondary | ICD-10-CM

## 2016-04-10 DIAGNOSIS — E041 Nontoxic single thyroid nodule: Secondary | ICD-10-CM

## 2016-04-10 DIAGNOSIS — E781 Pure hyperglyceridemia: Secondary | ICD-10-CM

## 2016-04-10 DIAGNOSIS — I10 Essential (primary) hypertension: Secondary | ICD-10-CM

## 2016-04-10 LAB — BASIC METABOLIC PANEL
BUN: 20 mg/dL (ref 7–25)
CO2: 26 mmol/L (ref 20–31)
Calcium: 9.4 mg/dL (ref 8.6–10.4)
Chloride: 106 mmol/L (ref 98–110)
Creat: 1.31 mg/dL — ABNORMAL HIGH (ref 0.60–0.93)
Glucose, Bld: 130 mg/dL — ABNORMAL HIGH (ref 65–99)
Potassium: 5.4 mmol/L — ABNORMAL HIGH (ref 3.5–5.3)
Sodium: 140 mmol/L (ref 135–146)

## 2016-04-10 LAB — LIPID PANEL
Cholesterol: 145 mg/dL (ref 125–200)
HDL: 57 mg/dL (ref >=46)
LDL Cholesterol: 62 mg/dL (ref <130)
Total CHOL/HDL Ratio: 2.5 Ratio (ref <=5.0)
Triglycerides: 128 mg/dL (ref <150)
VLDL: 26 mg/dL (ref <30)

## 2016-04-10 LAB — TSH: TSH: 1.74 mIU/L

## 2016-04-10 MED ORDER — LOSARTAN POTASSIUM 25 MG PO TABS: 25.0000 mg | ORAL_TABLET | Freq: Two times a day (BID) | ORAL | 3 refills | Status: DC

## 2016-04-10 MED ORDER — GLIPIZIDE 5 MG PO TABS: 5.0000 mg | ORAL_TABLET | Freq: Every day | ORAL | 3 refills | Status: DC

## 2016-04-10 NOTE — Progress Notes (Signed)
Location:  [facility] clinic Provider:  Rasaan Brotherton L. Mariea Clonts, D.O., C.M.D.  Code Status: DNR Goals of Care:  Advanced Directives 04/10/2016  Does patient have an advance directive? No  Type of Advance Directive -  Does patient want to make changes to advanced directive? -  Copy of advanced directive(s) in chart? -  Would patient like information on creating an advanced directive? Yes - Educational materials given  Pre-existing out of facility DNR order (yellow form or pink MOST form) -     Chief Complaint  Patient presents with  . Medical Management of Chronic Issues    3 mth follow-up    HPI: Patient is a 80 y.o. female seen today for medical management of chronic diseases.    Says her bp was too low with her medications.  She was getting dizzy. When she takes the fluid pill, she gets dizzy.  BP this am 140/90.  BPs ranging from 82/52 to 179/75.  Not taking losartan 50mg  po bid, atenolol 25mg  po bid.    Does not take her metformin at suppertime. Might use half.  Takes her metformin and glipizide in the mornings.  Has only had two low sugars recently.  Reports less stress with her husband--he's doing better.    She got the gel shot in her right knee.  She is also on tumeric now per their advice.  She is able to walk more.  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  Past Medical History:  Diagnosis Date  . Anxiety    regarding surgery; occassionally pt. states  . Anxiety state, unspecified   . Arthritis   . Asymptomatic varicose veins   . Chronic kidney disease   . Diabetic retinopathy ([place])   . Edema   . Hard of hearing   . Hyperpotassemia   . Hypertension   . Inflammation of joint of knee    right knee  . Insomnia, unspecified   . Multiple thyroid nodules   . Osteoarthrosis, unspecified whether generalized or localized, unspecified site   . Other abnormal blood chemistry   . Other and unspecified hyperlipidemia   . Other malaise and fatigue   . Other  specified disease of nail   . Overweight(278.02)   . Pain in joint, lower leg   . Pain in joint, pelvic region and thigh   . Peripheral vascular disease ([place])    varicose veins-  "Blood Clot anterior left LEG WITH PREGNANCY"  . PONV (postoperative nausea and vomiting)    pt. states that she was sick after hip surgery and oral surgery  . Type II or unspecified type diabetes mellitus with renal manifestations, not stated as uncontrolled   . Undiagnosed cardiac murmurs   . Unspecified disorder of kidney and ureter   . Unspecified disorder of kidney and ureter   . Urinary tract infection, site not specified     Past Surgical History:  Procedure Laterality Date  . ABDOMINAL HYSTERECTOMY    . EYE SURGERY Bilateral    cataract extraction with IOL  . LIPOMA EXCISION     x2  bilateral buttocks  . spinal meningitis  1957  . TONSILLECTOMY    . TOTAL HIP ARTHROPLASTY Right 11/29/2012   Procedure: RIGHT TOTAL HIP ARTHROPLASTY ANTERIOR APPROACH;  Surgeon: Mcarthur Rossetti, MD;  Location: [facility];  Service: Orthopedics;  Laterality: Right;  . TOTAL HIP ARTHROPLASTY Left 06/01/2015   Procedure: LEFT TOTAL HIP ARTHROPLASTY ANTERIOR APPROACH;  Surgeon: Mcarthur Rossetti,  MD;  Location: [place];  Service: Orthopedics;  Laterality: Left;    Allergies  Allergen Reactions  . Naproxen Swelling and Other (See Comments)    Blacked out - Face swelling   . Sulfur Hives  . Tramadol Nausea And Vomiting      Medication List       Accurate as of 04/10/16  8:48 AM. Always use your most recent med list.          ACCU-CHEK FASTCLIX LANCETS Misc Use to test blood sugar  twice daily   ACCU-CHEK SMARTVIEW test strip Generic drug:  glucose blood Check blood sugar two times daily   acetaminophen 500 MG tablet Commonly known as:  TYLENOL Take 1,000 mg by mouth every 8 (eight) hours as needed for mild pain or moderate pain.   aspirin 325 MG EC tablet Take 1 tablet (325 mg total) by mouth daily  with breakfast.   atenolol 50 MG tablet Commonly known as:  TENORMIN Take 1/2 tablet by mouth twice daily   EPINEPHrine 0.3 mg/0.3 mL Soaj injection Commonly known as:  EPI-PEN Inject 0.3 mLs (0.3 mg total) into the muscle once.   furosemide 20 MG tablet Commonly known as:  LASIX Take 1 tablet by mouth  daily for blood pressure  and edema   glipiZIDE 5 MG tablet Commonly known as:  GLUCOTROL Take 1 tablet by mouth  twice a day before meals  (breakfast and lunch)   losartan 50 MG tablet Commonly known as:  COZAAR TAKE ONE TABLET BY MOUTH  TWICE DAILY TO CONTROL  BLOOD PRESSURE   metFORMIN 500 MG tablet Commonly known as:  GLUCOPHAGE Take 2 tablets by mouth two times daily with meals   simvastatin 40 MG tablet Commonly known as:  ZOCOR Take 1 tablet by mouth  daily   SYSTANE BALANCE OP Apply 1 drop to eye daily as needed (dry eyes).       Review of Systems:  Review of Systems  Constitutional: Negative for chills, fever and malaise/fatigue.  HENT: Negative for hearing loss.        Had dental work and took ibuprofen some for it  Eyes: Negative for blurred vision.  Respiratory: Negative for cough and shortness of breath.   Cardiovascular: Negative for chest pain, palpitations and leg swelling.  Gastrointestinal: Negative for abdominal pain.  Genitourinary: Negative for dysuria.  Musculoskeletal: Positive for joint pain.  Skin: Negative for rash.  Neurological: Positive for dizziness. Negative for loss of consciousness and weakness.  Psychiatric/Behavioral: Negative for memory loss. The patient is nervous/anxious.     Health Maintenance  Topic Date Due  . TETANUS/TDAP  04/11/1957  . DEXA SCAN  04/12/2003  . OPHTHALMOLOGY EXAM  10/15/2015  . FOOT EXAM  11/06/2015  . PNA vac Low Risk Adult (2 of 2 - PPSV23) 11/06/2015  . INFLUENZA VACCINE  03/28/2016  . HEMOGLOBIN A1C  06/16/2016  . ZOSTAVAX  Addressed    Physical Exam: Vitals:   04/10/16 0837  BP: 140/90    Pulse: 68  Temp: 97.9 F (36.6 C)  TempSrc: Oral  SpO2: 98%  Weight: 204 lb (92.5 kg)   Body mass index is 35.02 kg/m. Physical Exam  Constitutional: She is oriented to person, place, and time. She appears well-developed and well-nourished. No distress.  Cardiovascular: Normal rate, regular rhythm, normal heart sounds and intact distal pulses.   Pulmonary/Chest: Effort normal and breath sounds normal. No respiratory distress.  Abdominal: Soft. Bowel sounds are normal. She exhibits no  distension. There is no tenderness. There is no guarding.  Musculoskeletal: Normal range of motion.  Mild edema right knee  Neurological: She is alert and oriented to person, place, and time.  Skin: Skin is warm and dry. Capillary refill takes less than 2 seconds.  Psychiatric:  anxious    Labs reviewed: Basic Metabolic Panel:  Recent Labs  06/02/15 0505 08/02/15 0937 08/02/15 0939 12/16/15 0924  NA 134* 140  --  142  K 4.1 4.6  --  5.1  CL 99* 101  --  104  CO2 25 23  --  24  GLUCOSE 201* 111*  --  144*  BUN 15 10  --  17  CREATININE 1.08* 1.07*  --  1.11*  CALCIUM 8.5* 9.4  --  9.8  TSH  --   --  1.710  --    Liver Function Tests:  Recent Labs  04/23/15 0806 08/02/15 0937  AST 14 15  ALT 10 10  ALKPHOS 67 69  BILITOT 0.5 0.3  PROT 6.4 6.4  ALBUMIN 4.0 4.0   No results for input(s): LIPASE, AMYLASE in the last 8760 hours. No results for input(s): AMMONIA in the last 8760 hours. CBC:  Recent Labs  05/21/15 1008 06/02/15 0505 08/02/15 0937 12/16/15 0924  WBC 10.7* 9.6 9.0 10.5  NEUTROABS  --   --  5.3 7.1*  HGB 12.3 11.3*  --   --   HCT 37.3 33.0* 36.2 37.8  MCV 93.0 91.9 93 92  PLT 220 181 256 275   Lipid Panel:  Recent Labs  04/23/15 0806 08/02/15 0937 12/16/15 0924  CHOL 144 138 131  HDL 46 48 48  LDLCALC 65 58 53  TRIG 166* 160* 151*  CHOLHDL 3.1 2.9 2.7   Lab Results  Component Value Date   HGBA1C 6.5 (H) 12/16/2015    Assessment/Plan 1. Type  2 diabetes mellitus with diabetic retinopathy, macular edema presence unspecified, with unspecified retinopathy severity - control had been improving -as usual, she has self-adjusted her medications and is actually taking more glipizide than prescribed and less metformin than prescribed -advised to please NOT change her regimen w/o notifying my office b/c she may develop problems (has been having dizziness and a couple of lows) - changes today:  Only take one glipizide at breakfast and NONE at lunch or supper. -take metformin 1000mg  po bid with meals (two 500mg  tablets twice a day) - Hemoglobin [id number] - Basic metabolic panel - Lipid panel  2. Essential hypertension - d/c atenolol due to dizziness and monitor pulse - decrease losartan to 25mg  po bid (1/2 of a 50mg  tablet twice a day) - monitor dizziness also (unclear if this is truly due to her low bps or if it is due to her glucose) - Basic metabolic panel  3. Orthostatic dizziness -previously noted, but then she made changes to her regimen ion her own -see #2  4. Cystic thyroid nodule - f/u labs: - TSH  5. Hypertriglyceridemia - f/u FLP today  6. Post-traumatic osteoarthritis of right knee -s/p cartilage injection with some benefit -cont tylenol for pain as needed and try to walk more  Labs/tests ordered:   Orders Placed This Encounter  Procedures  . Hemoglobin A1c  . TSH  . Basic metabolic panel  . Lipid panel    Order Specific Question:   Has the patient fasted?    Answer:   Yes   Next appt:  3 mos for med mgt   Myeisha Kruser L.  Albertine Lafoy, D.O. [facility] [address], [place] [address] Cell Phone (Mon-Fri 8am-5pm):  [phone] On Call:  [phone] & follow prompts after 5pm & weekends Office Phone:  [phone] Office Fax:  [phone]

## 2016-04-10 NOTE — Patient Instructions (Addendum)
Stop atenolol altogether. Decrease your losartan to 25mg  twice daily. Continue to check your blood pressure daily at least one hour after your morning medications and write down your BP and pulse. Take glipizide only in the morning with breakfast. Do not take glipizide at lunch.   Take your 2 metformin twice a day every day. Continue to check your glucose levels and record them.

## 2016-04-11 ENCOUNTER — Encounter: Payer: Self-pay | Admitting: *Deleted

## 2016-04-11 LAB — HEMOGLOBIN A1C
Hgb A1c MFr Bld: 6.6 % — ABNORMAL HIGH (ref <5.7)
Mean Plasma Glucose: 143 mg/dL

## 2016-04-14 ENCOUNTER — Telehealth: Payer: Self-pay | Admitting: *Deleted

## 2016-04-14 NOTE — Telephone Encounter (Signed)
Patient called and left message wanting her lab results and stated that if she was not at home to leave on answering machine. Called and left message of blood work results and informed her that a copy was put in the mail to her.

## 2016-04-20 ENCOUNTER — Other Ambulatory Visit: Payer: Self-pay | Admitting: Internal Medicine

## 2016-06-13 ENCOUNTER — Other Ambulatory Visit: Payer: Self-pay | Admitting: Internal Medicine

## 2016-06-27 ENCOUNTER — Telehealth (INDEPENDENT_AMBULATORY_CARE_PROVIDER_SITE_OTHER): Payer: Self-pay | Admitting: *Deleted

## 2016-06-27 MED ORDER — DICLOFENAC SODIUM 1 % TD GEL: 4.0000 g | Freq: Four times a day (QID) | TRANSDERMAL | 3 refills | Status: DC | PRN

## 2016-06-27 NOTE — Telephone Encounter (Signed)
I think I just escribed it in

## 2016-06-27 NOTE — Telephone Encounter (Signed)
Pt. Called stating she was at [employer] and someone told her about a cream that helped her knees. Pt called stating she would like to try this is possible. The cream is called boltaren. Pt. Requesting a call back. She would use [facility] on [address].

## 2016-06-27 NOTE — Telephone Encounter (Signed)
Please advise 

## 2016-07-17 ENCOUNTER — Ambulatory Visit (INDEPENDENT_AMBULATORY_CARE_PROVIDER_SITE_OTHER): Payer: Medicare Other | Admitting: Internal Medicine

## 2016-07-17 ENCOUNTER — Encounter: Payer: Self-pay | Admitting: Internal Medicine

## 2016-07-17 VITALS — BP 128/70 | HR 87 | Temp 98.0°F | Ht 64.0 in | Wt 200.0 lb

## 2016-07-17 DIAGNOSIS — F411 Generalized anxiety disorder: Secondary | ICD-10-CM

## 2016-07-17 DIAGNOSIS — Z23 Encounter for immunization: Secondary | ICD-10-CM | POA: Diagnosis not present

## 2016-07-17 DIAGNOSIS — M1731 Unilateral post-traumatic osteoarthritis, right knee: Secondary | ICD-10-CM | POA: Diagnosis not present

## 2016-07-17 DIAGNOSIS — I10 Essential (primary) hypertension: Secondary | ICD-10-CM

## 2016-07-17 DIAGNOSIS — E781 Pure hyperglyceridemia: Secondary | ICD-10-CM

## 2016-07-17 DIAGNOSIS — E08319 Diabetes mellitus due to underlying condition with unspecified diabetic retinopathy without macular edema: Secondary | ICD-10-CM

## 2016-07-17 LAB — COMPLETE METABOLIC PANEL WITH GFR
ALT: 12 U/L (ref 6–29)
AST: 18 U/L (ref 10–35)
Albumin: 4.3 g/dL (ref 3.6–5.1)
Alkaline Phosphatase: 65 U/L (ref 33–130)
BUN: 23 mg/dL (ref 7–25)
CO2: 25 mmol/L (ref 20–31)
Calcium: 9.4 mg/dL (ref 8.6–10.4)
Chloride: 101 mmol/L (ref 98–110)
Creat: 1.46 mg/dL — ABNORMAL HIGH (ref 0.60–0.93)
GFR, Est African American: 39 mL/min — ABNORMAL LOW (ref >=60)
GFR, Est Non African American: 34 mL/min — ABNORMAL LOW (ref >=60)
Glucose, Bld: 171 mg/dL — ABNORMAL HIGH (ref 65–99)
Potassium: 4.8 mmol/L (ref 3.5–5.3)
Sodium: 137 mmol/L (ref 135–146)
Total Bilirubin: 0.5 mg/dL (ref 0.2–1.2)
Total Protein: 7.1 g/dL (ref 6.1–8.1)

## 2016-07-17 LAB — CBC WITH DIFFERENTIAL/PLATELET
Basophils Absolute: 0 cells/uL (ref 0–200)
Basophils Relative: 0 %
Eosinophils Absolute: 95 cells/uL (ref 15–500)
Eosinophils Relative: 1 %
HCT: 38.1 % (ref 35.0–45.0)
Hemoglobin: 12.6 g/dL (ref 11.7–15.5)
Lymphocytes Relative: 19 %
Lymphs Abs: 1805 cells/uL (ref 850–3900)
MCH: 29.6 pg (ref 27.0–33.0)
MCHC: 33.1 g/dL (ref 32.0–36.0)
MCV: 89.4 fL (ref 80.0–100.0)
MPV: 9.7 fL (ref 7.5–12.5)
Monocytes Absolute: 950 cells/uL (ref 200–950)
Monocytes Relative: 10 %
Neutro Abs: 6650 cells/uL (ref 1500–7800)
Neutrophils Relative %: 70 %
Platelets: 278 10*3/uL (ref 140–400)
RBC: 4.26 MIL/uL (ref 3.80–5.10)
RDW: 13.7 % (ref 11.0–15.0)
WBC: 9.5 10*3/uL (ref 3.8–10.8)

## 2016-07-17 LAB — LIPID PANEL
Cholesterol: 147 mg/dL (ref <200)
HDL: 55 mg/dL (ref >50)
LDL Cholesterol: 61 mg/dL (ref <100)
Total CHOL/HDL Ratio: 2.7 Ratio (ref <5.0)
Triglycerides: 154 mg/dL — ABNORMAL HIGH (ref <150)
VLDL: 31 mg/dL — ABNORMAL HIGH (ref <30)

## 2016-07-17 NOTE — Progress Notes (Signed)
Location:  [facility] clinic Provider:  Sanae Willetts L. Mariea Clonts, D.O., C.M.D.  Code Status: full code Goals of Care:  Advanced Directives 04/10/2016  Does patient have an advance directive? No  Type of Advance Directive -  Does patient want to make changes to advanced directive? -  Copy of advanced directive(s) in chart? -  Would patient like information on creating an advanced directive? Yes - Educational materials given  Pre-existing out of facility DNR order (yellow form or pink MOST form) -     Chief Complaint  Patient presents with  . Medical Management of Chronic Issues    3 mth follow-up    HPI: Patient is a 80 y.o. female seen today for medical management of chronic diseases.    Wants flu shot, but her husband got ill when he had it.    BP varies tremendously.  Off atenolol, she has had more energy--did house cleaning.   Has ranged from 94/69 to 165/77.  Once in a while, she is dizzy after taking the fluid pills.  Uses as needed.  Still does not drink like she should.    Is set up for a shot ("rooster") in her right knee this month--Dr. Ninfa Linden.  She has been using voltaren gel and it's working really well.  Has to sit between 2-3 times, but still getting a lot done.   DMII:  She is taking the metformin at supper now as directed.  Off the glipizide. postprandial sugars sometimes 200s in the afternoons.  She is having fastings in the 90s.       Past Medical History:  Diagnosis Date  . Anxiety    regarding surgery; occassionally pt. states  . Anxiety state, unspecified   . Arthritis   . Asymptomatic varicose veins   . Chronic kidney disease   . Diabetic retinopathy ([place])   . Edema   . Hard of hearing   . Hyperpotassemia   . Hypertension   . Inflammation of joint of knee    right knee  . Insomnia, unspecified   . Multiple thyroid nodules   . Osteoarthrosis, unspecified whether generalized or localized, unspecified site   . Other abnormal blood chemistry   . Other and  unspecified hyperlipidemia   . Other malaise and fatigue   . Other specified disease of nail   . Overweight(278.02)   . Pain in joint, lower leg   . Pain in joint, pelvic region and thigh   . Peripheral vascular disease ([place])    varicose veins-  "Blood Clot anterior left LEG WITH PREGNANCY"  . PONV (postoperative nausea and vomiting)    pt. states that she was sick after hip surgery and oral surgery  . Type II or unspecified type diabetes mellitus with renal manifestations, not stated as uncontrolled(250.40)   . Undiagnosed cardiac murmurs   . Unspecified disorder of kidney and ureter   . Unspecified disorder of kidney and ureter   . Urinary tract infection, site not specified     Past Surgical History:  Procedure Laterality Date  . ABDOMINAL HYSTERECTOMY    . EYE SURGERY Bilateral    cataract extraction with IOL  . LIPOMA EXCISION     x2  bilateral buttocks  . spinal meningitis  1957  . TONSILLECTOMY    . TOTAL HIP ARTHROPLASTY Right 11/29/2012   Procedure: RIGHT TOTAL HIP ARTHROPLASTY ANTERIOR APPROACH;  Surgeon: Mcarthur Rossetti, MD;  Location: [facility];  Service: Orthopedics;  Laterality: Right;  . TOTAL HIP ARTHROPLASTY  Left 06/01/2015   Procedure: LEFT TOTAL HIP ARTHROPLASTY ANTERIOR APPROACH;  Surgeon: Mcarthur Rossetti, MD;  Location: [place] [facility];  Service: Orthopedics;  Laterality: Left;    Allergies  Allergen Reactions  . Naproxen Swelling and Other (See Comments)    Blacked out - Face swelling   . Sulfur Hives  . Tramadol Nausea And Vomiting      Medication List       Accurate as of 07/17/16 10:40 AM. Always use your most recent med list.          ACCU-CHEK FASTCLIX LANCETS Misc Use to test blood sugar  twice daily   ACCU-CHEK SMARTVIEW test strip Generic drug:  glucose blood Check blood sugar two times daily   acetaminophen 500 MG tablet Commonly known as:  TYLENOL Take 1,000 mg by mouth every 8 (eight) hours as needed for mild pain or moderate  pain.   aspirin 325 MG EC tablet Take 1 tablet (325 mg total) by mouth daily with breakfast.   diclofenac sodium 1 % Gel Commonly known as:  VOLTAREN Apply 4 g topically 4 (four) times daily as needed.   furosemide 20 MG tablet Commonly known as:  LASIX TAKE 1 TABLET BY MOUTH  DAILY FOR BLOOD PRESSURE  AND EDEMA   glipiZIDE 5 MG tablet Commonly known as:  GLUCOTROL Take 1 tablet (5 mg total) by mouth daily before breakfast.   losartan 25 MG tablet Commonly known as:  COZAAR Take 1 tablet (25 mg total) by mouth 2 (two) times daily.   metFORMIN 500 MG tablet Commonly known as:  GLUCOPHAGE Take 2 tablets by mouth two times daily with meals   simvastatin 40 MG tablet Commonly known as:  ZOCOR Take 1 tablet by mouth  daily   SYSTANE BALANCE OP Apply 1 drop to eye daily as needed (dry eyes).       Review of Systems:  Review of Systems  Constitutional: Positive for weight loss. Negative for chills and fever.       Down 4 lbs  HENT: Negative for congestion.   Eyes: Negative for blurred vision.  Respiratory: Negative for shortness of breath.   Cardiovascular: Negative for chest pain and leg swelling.  Gastrointestinal: Negative for abdominal pain, blood in stool, constipation and melena.  Genitourinary: Negative for dysuria.  Musculoskeletal: Positive for joint pain. Negative for falls.  Skin: Negative for rash.  Neurological: Positive for dizziness. Negative for loss of consciousness.  Endo/Heme/Allergies:       Diabetes  Psychiatric/Behavioral: Negative for depression and memory loss. The patient is nervous/anxious.     Health Maintenance  Topic Date Due  . TETANUS/TDAP  04/11/1957  . DEXA SCAN  04/12/2003  . PNA vac Low Risk Adult (2 of 2 - PPSV23) 11/06/2015  . INFLUENZA VACCINE  03/28/2016  . OPHTHALMOLOGY EXAM  09/26/2016  . HEMOGLOBIN A1C  10/11/2016  . FOOT EXAM  04/10/2017  . ZOSTAVAX  Addressed    Physical Exam: Vitals:   07/17/16 1018  BP: 128/70    Pulse: 87  Temp: 98 F (36.7 C)  TempSrc: Oral  SpO2: 97%  Weight: 200 lb (90.7 kg)  Height: 5\' 4"  (1.626 m)   Body mass index is 34.33 kg/m. Physical Exam  Constitutional: She is oriented to person, place, and time. She appears well-developed and well-nourished. No distress.  Cardiovascular: Normal rate, regular rhythm, normal heart sounds and intact distal pulses.   Pulmonary/Chest: Effort normal and breath sounds normal. No respiratory distress.  Abdominal:  Bowel sounds are normal.  Musculoskeletal: Normal range of motion.  Neurological: She is alert and oriented to person, place, and time.  Skin: Skin is warm and dry.  Psychiatric:  Anxious, jittery    Labs reviewed: Basic Metabolic Panel:  Recent Labs  08/02/15 0937 08/02/15 0939 12/16/15 0924 04/10/16 0938  NA 140  --  142 140  K 4.6  --  5.1 5.4*  CL 101  --  104 106  CO2 23  --  24 26  GLUCOSE 111*  --  144* 130*  BUN 10  --  17 20  CREATININE 1.07*  --  1.11* 1.31*  CALCIUM 9.4  --  9.8 9.4  TSH  --  1.710  --  1.74   Liver Function Tests:  Recent Labs  08/02/15 0937  AST 15  ALT 10  ALKPHOS 69  BILITOT 0.3  PROT 6.4  ALBUMIN 4.0   No results for input(s): LIPASE, AMYLASE in the last 8760 hours. No results for input(s): AMMONIA in the last 8760 hours. CBC:  Recent Labs  08/02/15 0937 12/16/15 0924  WBC 9.0 10.5  NEUTROABS 5.3 7.1*  HCT 36.2 37.8  MCV 93 92  PLT 256 275   Lipid Panel:  Recent Labs  08/02/15 0937 12/16/15 0924 04/10/16 0915  CHOL 138 131 145  HDL 48 48 57  LDLCALC 58 53 62  TRIG 160* 151* 128  CHOLHDL 2.9 2.7 2.5   Lab Results  Component Value Date   HGBA1C 6.6 (H) 04/10/2016     Assessment/Plan 1. Diabetes mellitus due to underlying condition with retinopathy of both eyes, without long-term current use of insulin, macular edema presence unspecified, unspecified retinopathy severity (H[facility]) - stable, has not been taking glipizide, is usually taking  metformin, but occasionally only a 1/2 pill with dinner -still not eating breakfast or hydrating well - CBC with Differential/Platelet - COMPLETE METABOLIC PANEL WITH GFR - Hemoglobin A1c  2. Hypertriglyceridemia - f/u labs, cont zocor and dietary changes - Lipid panel - Hemoglobin A1c  3. Essential hypertension -bp seems as well controlled as we can get for her--highly variable -still dizzy with lows sometimes, but then it goes as high as 160s  4. Generalized anxiety disorder -ongoing, likely contributor to bp fluctuation along with diet  5. Post-traumatic osteoarthritis of right knee -is getting cartilage injections if needed with Dr. Ninfa Linden but primarily using voltaren gel--may not get the shot if she is doing well enough w/o it  6. Need for prophylactic vaccination and inoculation against influenza - Flu Vaccine QUAD 36+ mos PF IM (Fluarix & Fluzone Quad PF)  Labs/tests ordered:   Orders Placed This Encounter  Procedures  . Flu Vaccine QUAD 36+ mos PF IM (Fluarix & Fluzone Quad PF)  . CBC with Differential/Platelet  . COMPLETE METABOLIC PANEL WITH GFR    [facility]  . Lipid panel    Order Specific Question:   Has the patient fasted?    Answer:   Yes  . Hemoglobin A1c    Next appt: 12/28/2016 med mgt   Praise Stennett L. Bayyinah Dukeman, D.O. [facility] [address], [place] [address] Cell Phone (Mon-Fri 8am-5pm):  [phone] On Call:  [phone] & follow prompts after 5pm & weekends Office Phone:  [phone] Office Fax:  [phone]

## 2016-07-17 NOTE — Patient Instructions (Addendum)
Please drink 6-8 8oz glasses of water.  Coffee does not count.   Try getting a water bottle with measurements or water bottles of the same size.

## 2016-07-18 LAB — HEMOGLOBIN A1C
Hgb A1c MFr Bld: 6.7 % — ABNORMAL HIGH (ref <5.7)
Mean Plasma Glucose: 146 mg/dL

## 2016-07-26 ENCOUNTER — Ambulatory Visit (INDEPENDENT_AMBULATORY_CARE_PROVIDER_SITE_OTHER): Payer: Self-pay | Admitting: Orthopaedic Surgery

## 2016-09-05 ENCOUNTER — Other Ambulatory Visit: Payer: Self-pay | Admitting: Internal Medicine

## 2016-09-11 ENCOUNTER — Encounter: Payer: Self-pay | Admitting: Internal Medicine

## 2016-09-26 ENCOUNTER — Telehealth (INDEPENDENT_AMBULATORY_CARE_PROVIDER_SITE_OTHER): Payer: Self-pay | Admitting: Orthopaedic Surgery

## 2016-09-26 NOTE — Telephone Encounter (Signed)
precert done and approved. Patient aware

## 2016-09-26 NOTE — Telephone Encounter (Signed)
PATIENT NEEDS REFILL FOR VOLTAREN CREAM, THE RX NEEDS PRIOR AUTHORIZATION # TO THE INSURANCE COMPANY IS [phone]. THE NUMBER TO REACH THE PATIENT IS [phone].

## 2016-10-23 ENCOUNTER — Encounter: Payer: Self-pay | Admitting: *Deleted

## 2016-10-23 DIAGNOSIS — E113492 Type 2 diabetes mellitus with severe nonproliferative diabetic retinopathy without macular edema, left eye: Secondary | ICD-10-CM | POA: Diagnosis not present

## 2016-10-23 DIAGNOSIS — H35361 Drusen (degenerative) of macula, right eye: Secondary | ICD-10-CM | POA: Diagnosis not present

## 2016-10-23 DIAGNOSIS — H35043 Retinal micro-aneurysms, unspecified, bilateral: Secondary | ICD-10-CM | POA: Diagnosis not present

## 2016-10-23 DIAGNOSIS — E113491 Type 2 diabetes mellitus with severe nonproliferative diabetic retinopathy without macular edema, right eye: Secondary | ICD-10-CM | POA: Diagnosis not present

## 2016-10-23 LAB — HM DIABETES EYE EXAM

## 2016-12-19 ENCOUNTER — Encounter: Payer: Self-pay | Admitting: Internal Medicine

## 2016-12-28 ENCOUNTER — Other Ambulatory Visit: Payer: Self-pay | Admitting: Internal Medicine

## 2016-12-28 ENCOUNTER — Encounter: Payer: Self-pay | Admitting: Internal Medicine

## 2017-01-25 ENCOUNTER — Encounter: Payer: Self-pay | Admitting: Internal Medicine

## 2017-02-01 ENCOUNTER — Telehealth: Payer: Self-pay

## 2017-02-01 ENCOUNTER — Other Ambulatory Visit: Payer: Self-pay | Admitting: Internal Medicine

## 2017-02-01 NOTE — Telephone Encounter (Signed)
Incoming fax received requesting order for Accu-Chek Nano Kit  Left message on voicemail for patient to return call when available

## 2017-02-02 MED ORDER — ACCU-CHEK NANO SMARTVIEW W/DEVICE KIT: PACK | 0 refills | Status: DC

## 2017-02-02 NOTE — Telephone Encounter (Signed)
Spoke with patient, patient requested rx for [employer]. RX responded to electronically, sent to [facility]

## 2017-02-23 ENCOUNTER — Encounter: Payer: Self-pay | Admitting: Internal Medicine

## 2017-02-23 ENCOUNTER — Ambulatory Visit (INDEPENDENT_AMBULATORY_CARE_PROVIDER_SITE_OTHER): Payer: Medicare Other | Admitting: Internal Medicine

## 2017-02-23 VITALS — BP 140/86 | HR 81 | Temp 98.1°F | Resp 12 | Ht 64.0 in | Wt 207.0 lb

## 2017-02-23 DIAGNOSIS — E781 Pure hyperglyceridemia: Secondary | ICD-10-CM | POA: Diagnosis not present

## 2017-02-23 DIAGNOSIS — E08319 Diabetes mellitus due to underlying condition with unspecified diabetic retinopathy without macular edema: Secondary | ICD-10-CM

## 2017-02-23 DIAGNOSIS — Z23 Encounter for immunization: Secondary | ICD-10-CM | POA: Diagnosis not present

## 2017-02-23 DIAGNOSIS — I10 Essential (primary) hypertension: Secondary | ICD-10-CM

## 2017-02-23 DIAGNOSIS — R42 Dizziness and giddiness: Secondary | ICD-10-CM | POA: Diagnosis not present

## 2017-02-23 DIAGNOSIS — F411 Generalized anxiety disorder: Secondary | ICD-10-CM | POA: Diagnosis not present

## 2017-02-23 DIAGNOSIS — M1731 Unilateral post-traumatic osteoarthritis, right knee: Secondary | ICD-10-CM

## 2017-02-23 LAB — LIPID PANEL
Cholesterol: 132 mg/dL (ref <200)
HDL: 53 mg/dL (ref >50)
LDL Cholesterol: 58 mg/dL (ref <100)
Total CHOL/HDL Ratio: 2.5 Ratio (ref <5.0)
Triglycerides: 107 mg/dL (ref <150)
VLDL: 21 mg/dL (ref <30)

## 2017-02-23 LAB — CBC WITH DIFFERENTIAL/PLATELET
Basophils Absolute: 0 cells/uL (ref 0–200)
Basophils Relative: 0 %
Eosinophils Absolute: 267 cells/uL (ref 15–500)
Eosinophils Relative: 3 %
HCT: 36.1 % (ref 35.0–45.0)
Hemoglobin: 11.8 g/dL (ref 11.7–15.5)
Lymphocytes Relative: 28 %
Lymphs Abs: 2492 cells/uL (ref 850–3900)
MCH: 30.1 pg (ref 27.0–33.0)
MCHC: 32.7 g/dL (ref 32.0–36.0)
MCV: 92.1 fL (ref 80.0–100.0)
MPV: 9.5 fL (ref 7.5–12.5)
Monocytes Absolute: 1157 cells/uL — ABNORMAL HIGH (ref 200–950)
Monocytes Relative: 13 %
Neutro Abs: 4984 cells/uL (ref 1500–7800)
Neutrophils Relative %: 56 %
Platelets: 256 10*3/uL (ref 140–400)
RBC: 3.92 MIL/uL (ref 3.80–5.10)
RDW: 13.8 % (ref 11.0–15.0)
WBC: 8.9 10*3/uL (ref 3.8–10.8)

## 2017-02-23 LAB — COMPLETE METABOLIC PANEL WITH GFR
ALT: 10 U/L (ref 6–29)
AST: 14 U/L (ref 10–35)
Albumin: 4.1 g/dL (ref 3.6–5.1)
Alkaline Phosphatase: 61 U/L (ref 33–130)
BUN: 21 mg/dL (ref 7–25)
CO2: 22 mmol/L (ref 20–31)
Calcium: 9.2 mg/dL (ref 8.6–10.4)
Chloride: 106 mmol/L (ref 98–110)
Creat: 1.5 mg/dL — ABNORMAL HIGH (ref 0.60–0.93)
GFR, Est African American: 38 mL/min — ABNORMAL LOW (ref >=60)
GFR, Est Non African American: 33 mL/min — ABNORMAL LOW (ref >=60)
Glucose, Bld: 141 mg/dL — ABNORMAL HIGH (ref 65–99)
Potassium: 5 mmol/L (ref 3.5–5.3)
Sodium: 141 mmol/L (ref 135–146)
Total Bilirubin: 0.4 mg/dL (ref 0.2–1.2)
Total Protein: 6.7 g/dL (ref 6.1–8.1)

## 2017-02-23 NOTE — Progress Notes (Signed)
Provider:  Rexene Edison. Mariea Clonts, D.O., C.M.D. Location:   [facility]   Place of Service:   clinic  Previous PCP: Gayland Curry, DO Patient Care Team: Gayland Curry, DO as PCP - General (Geriatric Medicine) Zadie Rhine Clent Demark, MD as Consulting Physician (Ophthalmology)  Extended Emergency Contact Information Primary Emergency Contact: [facility] Address: [place], [place] [address] Johnnette Litter of [place] Phone: [phone] Mobile Phone: [phone] Relation: Daughter Secondary Emergency Contact: Ellithorpe,[name]  [place] Phone: [phone] Relation: Son  Code Status: full code Goals of Care: Advanced Directive information Advanced Directives 04/10/2016  Does Patient Have a Medical Advance Directive? No  Type of Advance Directive -  Does patient want to make changes to medical advance directive? -  Copy of [place] in Chart? -  Would patient like information on creating a medical advance directive? Yes - Educational materials given  Pre-existing out of facility DNR order (yellow form or pink MOST form) -    Chief Complaint  Patient presents with  . Medical Management of Chronic Issues    Extended visit , fasting if labs needed, MMSE 29/30 (passed clock drawing), EKG, DM foot exam due. No pap. Last eye exam 09/2016  . Health Maintenance    Pneu 23 today, A1c due today, refused TDaP and BMD.   . Medication Refill    No refills needed today     HPI: Patient is a 81 y.o. female seen today for an extended visit.  She reports doing well.    She is happy that her right knee is holding out with voltaren gel for pain.  She is walking fairly well.  She does not want surgery.  She was given her pneumovax today.  She refused her tdap and bone density tests despite extensive counseling.  Will do foot exam today.  She declined the annual wellness visit.    Says her energy over the winter was low--had not been eating  right.  Is doing better now after starting to "eat better".  Tomatoes have just started coming in--were not ready today.  Her husband has 60 plants this year.    BP up here, but 135/79 at home.  She gets very anxious coming to the doctor.  She did fast for her bloodwork.    DMII:  She is taking just aspirin 61m.  Says she is taking her meds as directed now.  She says she had to overeat last night--had cake with 43 carbs in it.  She was afraid her cbg would drop after that--188 then and 98 this am which she thinks is low and was advised is normal.    Past Medical History:  Diagnosis Date  . Anxiety    regarding surgery; occassionally pt. states  . Anxiety state, unspecified   . Arthritis   . Asymptomatic varicose veins   . Chronic kidney disease   . Diabetic retinopathy ([place]   . Edema   . Hard of hearing   . Hyperpotassemia   . Hypertension   . Inflammation of joint of knee    right knee  . Insomnia, unspecified   . Multiple thyroid nodules   . Osteoarthrosis, unspecified whether generalized or localized, unspecified site   . Other abnormal blood chemistry   . Other and unspecified hyperlipidemia   . Other malaise and fatigue   . Other specified disease of nail   . Overweight(278.02)   .  Pain in joint, lower leg   . Pain in joint, pelvic region and thigh   . Peripheral vascular disease ([place])    varicose veins-  "Blood Clot anterior left LEG WITH PREGNANCY"  . PONV (postoperative nausea and vomiting)    pt. states that she was sick after hip surgery and oral surgery  . Type II or unspecified type diabetes mellitus with renal manifestations, not stated as uncontrolled(250.40)   . Undiagnosed cardiac murmurs   . Unspecified disorder of kidney and ureter   . Unspecified disorder of kidney and ureter   . Urinary tract infection, site not specified    Past Surgical History:  Procedure Laterality Date  . ABDOMINAL HYSTERECTOMY    . EYE SURGERY Bilateral    cataract extraction  with IOL  . LIPOMA EXCISION     x2  bilateral buttocks  . spinal meningitis  1957  . TONSILLECTOMY    . TOTAL HIP ARTHROPLASTY Right 11/29/2012   Procedure: RIGHT TOTAL HIP ARTHROPLASTY ANTERIOR APPROACH;  Surgeon: Mcarthur Rossetti, MD;  Location: [facility];  Service: Orthopedics;  Laterality: Right;  . TOTAL HIP ARTHROPLASTY Left 06/01/2015   Procedure: LEFT TOTAL HIP ARTHROPLASTY ANTERIOR APPROACH;  Surgeon: Mcarthur Rossetti, MD;  Location: [facility];  Service: Orthopedics;  Laterality: Left;    reports that she has never smoked. She has never used smokeless tobacco. She reports that she does not drink alcohol or use drugs.  Functional Status Survey:    History reviewed. No pertinent family history.  Health Maintenance  Topic Date Due  . PNA vac Low Risk Adult (2 of 2 - PPSV23) 11/06/2015  . HEMOGLOBIN A1C  01/14/2017  . DEXA SCAN  08/29/2023 (Originally 04/12/2003)  . INFLUENZA VACCINE  03/28/2017  . FOOT EXAM  04/10/2017  . OPHTHALMOLOGY EXAM  10/23/2017    Allergies  Allergen Reactions  . Naproxen Swelling and Other (See Comments)    Blacked out - Face swelling   . Sulfur Hives  . Tramadol Nausea And Vomiting    Allergies as of 02/23/2017      Reactions   Naproxen Swelling, Other (See Comments)   Blacked out - Face swelling   Sulfur Hives   Tramadol Nausea And Vomiting      Medication List       Accurate as of 02/23/17  9:31 AM. Always use your most recent med list.          ACCU-CHEK FASTCLIX LANCETS Misc Test blood sugar twice daily DX E08.319   ACCU-CHEK NANO SMARTVIEW w/Device Kit Check blood sugar 2 times daily E08.319   ACCU-CHEK SMARTVIEW test strip Generic drug:  glucose blood Check blood sugar two times daily   acetaminophen 500 MG tablet Commonly known as:  TYLENOL Take 1,000 mg by mouth every 8 (eight) hours as needed for mild pain or moderate pain.   aspirin EC 81 MG tablet Take 81 mg by mouth daily.   diclofenac sodium 1 %  Gel Commonly known as:  VOLTAREN Apply 4 g topically 4 (four) times daily as needed.   furosemide 20 MG tablet Commonly known as:  LASIX TAKE 1 TABLET BY MOUTH  DAILY FOR BLOOD PRESSURE  AND EDEMA   glipiZIDE 5 MG tablet Commonly known as:  GLUCOTROL TAKE 1 TABLET BY MOUTH  TWICE A DAY BEFORE MEALS  (BREAKFAST AND LUNCH)   losartan 25 MG tablet Commonly known as:  COZAAR Take 1 tablet (25 mg total) by mouth 2 (two) times daily.  metFORMIN 500 MG tablet Commonly known as:  GLUCOPHAGE Take 2 by mouth two times daily with meals DX E08.319   simvastatin 40 MG tablet Commonly known as:  ZOCOR TAKE 1 TABLET BY MOUTH  DAILY   SYSTANE BALANCE OP Apply 1 drop to eye daily as needed (dry eyes).       Review of Systems  Constitutional: Negative for chills, fever and malaise/fatigue.  HENT: Positive for hearing loss. Negative for congestion.   Eyes: Negative for blurred vision.  Respiratory: Negative for cough and shortness of breath.   Cardiovascular: Negative for chest pain, palpitations and leg swelling.  Gastrointestinal: Negative for abdominal pain, blood in stool, constipation and melena.  Genitourinary: Negative for dysuria, frequency and urgency.  Musculoskeletal: Positive for joint pain. Negative for falls and myalgias.       Right knee  Skin: Negative for itching and rash.  Neurological: Negative for dizziness, tingling, sensory change, loss of consciousness and weakness.  Psychiatric/Behavioral: Negative for depression and memory loss. The patient is nervous/anxious. The patient does not have insomnia.     Vitals:   02/23/17 0858  BP: 140/86  Pulse: 81  Resp: 12  Temp: 98.1 F (36.7 C)  TempSrc: Oral  SpO2: 98%  Weight: 207 lb (93.9 kg)  Height: 5' 4" (1.626 m)   Body mass index is 35.53 kg/m. Physical Exam  Constitutional: She is oriented to person, place, and time. She appears well-developed and well-nourished.  HENT:  Head: Normocephalic and  atraumatic.  Right Ear: External ear normal.  Left Ear: External ear normal.  Nose: Nose normal.  Mouth/Throat: Oropharynx is clear and moist. No oropharyngeal exudate.  Small amounts of cerumen bilateral ears--advised to get debrox  Eyes: Conjunctivae and EOM are normal. Pupils are equal, round, and reactive to light.  Neck: Neck supple. No JVD present.  Cardiovascular: Normal rate, regular rhythm, normal heart sounds and intact distal pulses.   Pulmonary/Chest: Effort normal and breath sounds normal. No respiratory distress.  Abdominal: Soft. Bowel sounds are normal. She exhibits no distension. There is no tenderness.  Musculoskeletal: Normal range of motion. She exhibits no edema, tenderness or deformity.  Lymphadenopathy:    She has no cervical adenopathy.  Neurological: She is alert and oriented to person, place, and time. No cranial nerve deficit.  Skin: Skin is warm and dry. Capillary refill takes less than 2 seconds.  Psychiatric: She has a normal mood and affect. Her behavior is normal. Judgment and thought content normal.    Labs reviewed: Basic Metabolic Panel:  Recent Labs  04/10/16 0938 07/17/16 1127  NA 140 137  K 5.4* 4.8  CL 106 101  CO2 26 25  GLUCOSE 130* 171*  BUN 20 23  CREATININE 1.31* 1.46*  CALCIUM 9.4 9.4   Liver Function Tests:  Recent Labs  07/17/16 1127  AST 18  ALT 12  ALKPHOS 65  BILITOT 0.5  PROT 7.1  ALBUMIN 4.3   No results for input(s): LIPASE, AMYLASE in the last 8760 hours. No results for input(s): AMMONIA in the last 8760 hours. CBC:  Recent Labs  07/17/16 1127  WBC 9.5  NEUTROABS 6,650  HGB 12.6  HCT 38.1  MCV 89.4  PLT 278   Cardiac Enzymes: No results for input(s): CKTOTAL, CKMB, CKMBINDEX, TROPONINI in the last 8760 hours. BNP: Invalid input(s): POCBNP Lab Results  Component Value Date   HGBA1C 6.7 (H) 07/17/2016   Lab Results  Component Value Date   TSH 1.74 04/10/2016  No results found for:  VITAMINB12 No results found for: FOLATE No results found for: IRON, TIBC, FERRITIN  Imaging and Procedures Recently: EKG with NSR with leftward axis at 73bpm, no signs of infarct or ischemia  Assessment/Plan 1. Essential hypertension -bp at goal on home readings, cont same regimen and monitor - EKG 12-Lead normal today - CBC with Differential/Platelet - COMPLETE METABOLIC PANEL WITH GFR  2. Need for 23-polyvalent pneumococcal polysaccharide vaccine - Pneumococcal polysaccharide vaccine 23-valent greater than or equal to 2yo subcutaneous/IM given  3. Diabetes mellitus due to underlying condition with retinopathy of both eyes, without long-term current use of insulin, macular edema presence unspecified, unspecified retinopathy severity ([place]) - control has been improving, f/u labs today and cont same regimen -has historically not been following the directions on her meds and adjusting on her own - CBC with Differential/Platelet - COMPLETE METABOLIC PANEL WITH GFR - Hemoglobin A1c - Lipid panel  4. Post-traumatic osteoarthritis of right knee -ongoing, doing well with voltaren gel, tylenol  5. Generalized anxiety disorder -ongoing, not on medication and has refused this and counseling -causes some white coat htn  6. Hypertriglyceridemia -cont zocor which she tolerates well  - Lipid panel  7. Orthostatic dizziness -no recent complaints, cont same meds w/o self-adjustment  Labs/tests ordered:  Orders Placed This Encounter  Procedures  . Pneumococcal polysaccharide vaccine 23-valent greater than or equal to 2yo subcutaneous/IM  . CBC with Differential/Platelet  . COMPLETE METABOLIC PANEL WITH GFR  . Hemoglobin A1c  . Lipid panel  . EKG 12-Lead    Donnah Levert L. Haitham Dolinsky, D.O. [facility] [address], [place] [address] Cell Phone (Mon-Fri 8am-5pm):  [phone] On Call:  [phone] & follow prompts after 5pm &  weekends Office Phone:  [phone] Office Fax:  [phone]

## 2017-02-24 LAB — HEMOGLOBIN A1C
Hgb A1c MFr Bld: 6.8 % — ABNORMAL HIGH (ref <5.7)
Mean Plasma Glucose: 148 mg/dL

## 2017-03-01 ENCOUNTER — Other Ambulatory Visit: Payer: Self-pay | Admitting: Internal Medicine

## 2017-05-01 ENCOUNTER — Other Ambulatory Visit (INDEPENDENT_AMBULATORY_CARE_PROVIDER_SITE_OTHER): Payer: Self-pay | Admitting: Orthopaedic Surgery

## 2017-05-01 ENCOUNTER — Other Ambulatory Visit: Payer: Self-pay | Admitting: Internal Medicine

## 2017-06-27 ENCOUNTER — Other Ambulatory Visit: Payer: Self-pay | Admitting: Internal Medicine

## 2017-07-06 ENCOUNTER — Other Ambulatory Visit: Payer: Self-pay | Admitting: Internal Medicine

## 2017-07-23 ENCOUNTER — Other Ambulatory Visit: Payer: Self-pay | Admitting: Internal Medicine

## 2017-08-01 ENCOUNTER — Telehealth (INDEPENDENT_AMBULATORY_CARE_PROVIDER_SITE_OTHER): Payer: Self-pay | Admitting: Orthopaedic Surgery

## 2017-08-01 NOTE — Telephone Encounter (Signed)
Yes, she will be fine. And she had Monovisc

## 2017-08-01 NOTE — Telephone Encounter (Signed)
Patient cant remember if she received the synvisc or monovisc injection but she would like another one next week if possible. I made her an appointment for Monday and I told her I didn't know if it had to be ordered or not, but I would call her back and let her know either way. CB # [phone]

## 2017-08-02 ENCOUNTER — Encounter (INDEPENDENT_AMBULATORY_CARE_PROVIDER_SITE_OTHER): Payer: Self-pay | Admitting: Physician Assistant

## 2017-08-02 ENCOUNTER — Ambulatory Visit (INDEPENDENT_AMBULATORY_CARE_PROVIDER_SITE_OTHER): Payer: Medicare Other | Admitting: Physician Assistant

## 2017-08-02 DIAGNOSIS — M1711 Unilateral primary osteoarthritis, right knee: Secondary | ICD-10-CM | POA: Diagnosis not present

## 2017-08-02 MED ORDER — HYALURONAN 88 MG/4ML IX SOSY
88.0000 mg | PREFILLED_SYRINGE | INTRA_ARTICULAR | Status: AC | PRN
  Administered 2017-08-02: 88 mg via INTRA_ARTICULAR

## 2017-08-02 NOTE — Progress Notes (Deleted)
Right knee.

## 2017-08-02 NOTE — Progress Notes (Signed)
   Procedure Note  Patient: Julie Baxter             Date of Birth: 11-17-1935           MRN: [id number]             Visit Date: 08/02/2017  HPI Julie Baxter is well-known to Dr. Ninfa Linden service comes in today for Monovisc injection in her right knee.  Last injection was given in May 2017.  She states she has had pain in the knee for some time now just has not been in for injection.  She has had no particular injury to the knee.  Pain is worse with prolonged walking and bad weather days.  She has known end-stage osteoarthritis of the right knee.  Physical exam: Right knee she has full extension flexion beyond 90 degrees.  No effusion abnormal warmth erythema.  Procedures: Visit Diagnoses: No diagnosis found.  Large Joint Inj: R knee on 08/02/2017 4:51 PM Indications: pain Details: 22 G 1.5 in needle, anterolateral approach  Arthrogram: No  Medications: 88 mg Hyaluronan 88 MG/4ML Outcome: tolerated well, no immediate complications Procedure, treatment alternatives, risks and benefits explained, specific risks discussed. Consent was given by the patient. Immediately prior to procedure a time out was called to verify the correct patient, procedure, equipment, support staff and site/side marked as required. Patient was prepped and draped in the usual sterile fashion.     Plan: She will follow-up in 6 months for another Monovisc injection in the right knee.  She is given a prescription for a cane.  Encouraged to continue to work on Forensic scientist.

## 2017-08-06 ENCOUNTER — Ambulatory Visit (INDEPENDENT_AMBULATORY_CARE_PROVIDER_SITE_OTHER): Payer: Self-pay | Admitting: Physician Assistant

## 2017-08-22 ENCOUNTER — Other Ambulatory Visit: Payer: Self-pay | Admitting: Internal Medicine

## 2017-08-27 ENCOUNTER — Other Ambulatory Visit: Payer: Self-pay | Admitting: Internal Medicine

## 2017-09-13 ENCOUNTER — Ambulatory Visit (INDEPENDENT_AMBULATORY_CARE_PROVIDER_SITE_OTHER): Payer: Medicare Other | Admitting: Internal Medicine

## 2017-09-13 ENCOUNTER — Encounter: Payer: Self-pay | Admitting: Internal Medicine

## 2017-09-13 VITALS — BP 142/88 | HR 89 | Temp 98.1°F | Wt 204.0 lb

## 2017-09-13 DIAGNOSIS — E781 Pure hyperglyceridemia: Secondary | ICD-10-CM | POA: Diagnosis not present

## 2017-09-13 DIAGNOSIS — F411 Generalized anxiety disorder: Secondary | ICD-10-CM

## 2017-09-13 DIAGNOSIS — I10 Essential (primary) hypertension: Secondary | ICD-10-CM

## 2017-09-13 DIAGNOSIS — M1731 Unilateral post-traumatic osteoarthritis, right knee: Secondary | ICD-10-CM | POA: Diagnosis not present

## 2017-09-13 DIAGNOSIS — E11319 Type 2 diabetes mellitus with unspecified diabetic retinopathy without macular edema: Secondary | ICD-10-CM | POA: Diagnosis not present

## 2017-09-13 DIAGNOSIS — E08319 Diabetes mellitus due to underlying condition with unspecified diabetic retinopathy without macular edema: Secondary | ICD-10-CM

## 2017-09-13 DIAGNOSIS — Z7189 Other specified counseling: Secondary | ICD-10-CM | POA: Diagnosis not present

## 2017-09-13 NOTE — ACP (Advance Care Planning) (Signed)
Discussed advance care planning today.  Pt only one present with me for discussion. She agrees it's important to complete this documentation and asked for the advance directive form for [place].  This was provided--one for her and one for her husband.  She has already discussed her goals of care with her children.  We then discussed code status.  She reports she would like a DNR form.  If her heart stops or she stops breathing, she does not want CPR, defibrillation or to be put on a machine.  I completed the [employer] form for her and made a copy for her chart.  She will bring [place] copies of her living will and HCPOA when she finishes these.   18 minutes were spent discussing ACP with patient.

## 2017-09-13 NOTE — Progress Notes (Signed)
Location:  [facility] clinic Provider:  Quatisha Zylka L. Mariea Clonts, D.O., C.M.D.  Code Status: DNR; see ACP note  Goals of Care: still has not done her living will or health care power of attorney Advanced Directives 09/13/2017  Does Patient Have a Medical Advance Directive? No  Type of Advance Directive -  Does patient want to make changes to medical advance directive? -  Copy of [place] in Chart? -  Would patient like information on creating a medical advance directive? No - Patient declined  Pre-existing out of facility DNR order (yellow form or pink MOST form) -   Chief Complaint  Patient presents with  . Medical Management of Chronic Issues    49mh follow-up    HPI: Patient is a 82y.o. female seen today for medical management of chronic diseases--she won't come but twice a year.    BP sky high here.  She has her home readings which show 112/90, 121/74, 146/81, 87/72?, 97/63, 122/76.  HR runs from 80-99.   Had only 1-2 highs at home with bp like 1188Csystolic.  .    She's had "inner ear" dizziness.  It goes away with bonine and rest.  May be due to her sugars running low.    Down 3 lbs today.  Says she was eating some bad things over the holidays.  CBGs:  Still checking a lot--5-6 times a day at times.  CBG this am was 86. It was only in double digits yesterday in the morning.  She's still bad about not eating.    Got her right knee injection which has helped considerably with her pain.  Still sore, but not near so bad.  [facility] [place] [facility] [place] and she's ok, it was not recalled.    Past Medical History:  Diagnosis Date  . Anxiety    regarding surgery; occassionally pt. states  . Anxiety state, unspecified   . Arthritis   . Asymptomatic varicose veins   . Chronic kidney disease   . Diabetic retinopathy ([place]   . Edema   . Hard of hearing   . Hyperpotassemia   . Hypertension   . Inflammation of joint of knee      right knee  . Insomnia, unspecified   . Multiple thyroid nodules   . Osteoarthrosis, unspecified whether generalized or localized, unspecified site   . Other abnormal blood chemistry   . Other and unspecified hyperlipidemia   . Other malaise and fatigue   . Other specified disease of nail   . Overweight(278.02)   . Pain in joint, lower leg   . Pain in joint, pelvic region and thigh   . Peripheral vascular disease ([place]    varicose veins-  "Blood Clot anterior left LEG WITH PREGNANCY"  . PONV (postoperative nausea and vomiting)    pt. states that she was sick after hip surgery and oral surgery  . Type II or unspecified type diabetes mellitus with renal manifestations, not stated as uncontrolled(250.40)   . Undiagnosed cardiac murmurs   . Unspecified disorder of kidney and ureter   . Unspecified disorder of kidney and ureter   . Urinary tract infection, site not specified     Past Surgical History:  Procedure Laterality Date  . ABDOMINAL HYSTERECTOMY    . EYE SURGERY Bilateral    cataract extraction with IOL  . LIPOMA EXCISION     x2  bilateral buttocks  . spinal  meningitis  1957  . TONSILLECTOMY    . TOTAL HIP ARTHROPLASTY Right 11/29/2012   Procedure: RIGHT TOTAL HIP ARTHROPLASTY ANTERIOR APPROACH;  Surgeon: Mcarthur Rossetti, MD;  Location: [facility];  Service: Orthopedics;  Laterality: Right;  . TOTAL HIP ARTHROPLASTY Left 06/01/2015   Procedure: LEFT TOTAL HIP ARTHROPLASTY ANTERIOR APPROACH;  Surgeon: Mcarthur Rossetti, MD;  Location: [facility];  Service: Orthopedics;  Laterality: Left;    Allergies  Allergen Reactions  . Naproxen Swelling and Other (See Comments)    Blacked out - Face swelling   . Sulfur Hives  . Tramadol Nausea And Vomiting    Outpatient Encounter Medications as of 09/13/2017  Medication Sig  . ACCU-CHEK FASTCLIX LANCETS MISC Test blood sugar twice daily DX E08.319  . ACCU-CHEK SMARTVIEW test strip CHECK BLOOD SUGAR TWO TIMES DAILY  .  acetaminophen (TYLENOL) 500 MG tablet Take 1,000 mg by mouth every 8 (eight) hours as needed for mild pain or moderate pain.  Marland Kitchen aspirin EC 81 MG tablet Take 81 mg by mouth daily.  . diclofenac sodium (VOLTAREN) 1 % GEL APPLY TOPICALLY 2 TO 4  GRAMS UP TO 4 TIMES DAILY  . furosemide (LASIX) 20 MG tablet TAKE 1 TABLET BY MOUTH  DAILY FOR BLOOD PRESSURE  AND EDEMA  . glipiZIDE (GLUCOTROL) 5 MG tablet TAKE 1 TABLET BY MOUTH  TWICE A DAY BEFORE MEALS  (BREAKFAST AND LUNCH)  . losartan (COZAAR) 50 MG tablet TAKE ONE TABLET BY MOUTH  TWICE DAILY TO CONTROL  BLOOD PRESSURE  . metFORMIN (GLUCOPHAGE) 500 MG tablet TAKE 2 TABLETS BY MOUTH TWO TIMES DAILY WITH MEALS  . Propylene Glycol (SYSTANE BALANCE OP) Apply 1 drop to eye daily as needed (dry eyes).  . simvastatin (ZOCOR) 40 MG tablet TAKE 1 TABLET BY MOUTH  DAILY  . [DISCONTINUED] Blood Glucose Monitoring Suppl (ACCU-CHEK NANO SMARTVIEW) w/Device KIT Use to check blood sugar dx: E08.319   No facility-administered encounter medications on file as of 09/13/2017.     Review of Systems:  Review of Systems  Constitutional: Negative for chills, fever and malaise/fatigue.  HENT: Negative for congestion.   Eyes: Negative for blurred vision.  Respiratory: Negative for shortness of breath.   Cardiovascular: Negative for chest pain, palpitations and leg swelling.  Gastrointestinal: Negative for abdominal pain, blood in stool, constipation and melena.  Genitourinary: Negative for dysuria.  Musculoskeletal: Positive for joint pain. Negative for falls.       Right knee  Skin: Negative for itching and rash.  Neurological: Positive for dizziness. Negative for loss of consciousness and weakness.  Endo/Heme/Allergies: Bruises/bleeds easily.  Psychiatric/Behavioral: Negative for depression and memory loss. The patient is nervous/anxious.     Health Maintenance  Topic Date Due  . HEMOGLOBIN A1C  08/25/2017  . DEXA SCAN  08/29/2023 (Originally 04/12/2003)  .  OPHTHALMOLOGY EXAM  10/23/2017  . FOOT EXAM  02/23/2018  . INFLUENZA VACCINE  Completed  . PNA vac Low Risk Adult  Completed    Physical Exam: Vitals:   09/13/17 0944  BP: (!) 160/100  Pulse: 89  Temp: 98.1 F (36.7 C)  TempSrc: Oral  SpO2: 96%  Weight: 204 lb (92.5 kg)   Body mass index is 35.02 kg/m. Physical Exam  Constitutional: She is oriented to person, place, and time. She appears well-developed and well-nourished. No distress.  Cardiovascular: Normal rate, regular rhythm, normal heart sounds and intact distal pulses.  Pulmonary/Chest: Effort normal and breath sounds normal. No respiratory distress.  Abdominal: Soft.  Bowel sounds are normal.  Musculoskeletal: Normal range of motion. She exhibits tenderness.  Right knee tenderness  Neurological: She is alert and oriented to person, place, and time.  Skin: Skin is warm and dry. Capillary refill takes less than 2 seconds.  Psychiatric: She has a normal mood and affect.    Labs reviewed: Basic Metabolic Panel: Recent Labs    02/23/17 1012  NA 141  K 5.0  CL 106  CO2 22  GLUCOSE 141*  BUN 21  CREATININE 1.50*  CALCIUM 9.2   Liver Function Tests: Recent Labs    02/23/17 1012  AST 14  ALT 10  ALKPHOS 61  BILITOT 0.4  PROT 6.7  ALBUMIN 4.1   No results for input(s): LIPASE, AMYLASE in the last 8760 hours. No results for input(s): AMMONIA in the last 8760 hours. CBC: Recent Labs    02/23/17 1012  WBC 8.9  NEUTROABS 4,984  HGB 11.8  HCT 36.1  MCV 92.1  PLT 256   Lipid Panel: Recent Labs    02/23/17 1012  CHOL 132  HDL 53  LDLCALC 58  TRIG 107  CHOLHDL 2.5   Lab Results  Component Value Date   HGBA1C 6.8 (H) 02/23/2017    Assessment/Plan 1. Diabetes mellitus due to underlying condition with retinopathy of both eyes, without long-term current use of insulin, macular edema presence unspecified, unspecified retinopathy severity ([place]) - f/u labs and cont current therapy, refuses to stop  her sulfonylurea or to eat regular meals as advised due to lack of appetite - COMPLETE METABOLIC PANEL WITH GFR - Hemoglobin A1c  2. Diabetic retinopathy associated with type 2 diabetes mellitus, macular edema presence unspecified, unspecified laterality, unspecified retinopathy severity ([place]) - follows with ophtho - COMPLETE METABOLIC PANEL WITH GFR - Hemoglobin A1c  3. Generalized anxiety disorder -ongoing, worries and bp goes up (white coat) at office  4. Hypertriglyceridemia - f/u labs, cont statin - Lipid panel  5. Post-traumatic osteoarthritis of right knee -s/p injection with orthopedics, doing better, use tylenol for pain  6. Essential hypertension -bp elevated here, but seems to be white coat as home readings are low - CBC with Differential/Platelet - COMPLETE METABOLIC PANEL WITH GFR  7.  Advance care planning - Last Advance Care Planning (ACP) Note    Gayland Curry, DO (Physician) 09/13/2017 10:57  Geriatrics         Discussed advance care planning today.  Pt only one present with me for discussion. She agrees it's important to complete this documentation and asked for the advance directive form for [place].  This was provided--one for her and one for her husband.  She has already discussed her goals of care with her children.  We then discussed code status.  She reports she would like a DNR form.  If her heart stops or she stops breathing, she does not want CPR, defibrillation or to be put on a machine.  I completed the [place]rod form for her and made a copy for her chart.  She will bring [place] copies of her living will and HCPOA when she finishes these.   18 minutes were spent discussing ACP with patient.      25 minutes spent on medical mgt  Labs/tests ordered:   Orders Placed This Encounter  Procedures  . CBC with Differential/Platelet  . COMPLETE METABOLIC PANEL WITH GFR  . Hemoglobin A1c  . Lipid panel    Next appt:  6 mos for CPE, come fasting  Nesiah Jump L.  Lowen Mansouri, D.O. [facility] [address], [place] [address] Cell Phone (Mon-Fri 8am-5pm):  [phone] On Call:  [phone] & follow prompts after 5pm & weekends Office Phone:  [phone] Office Fax:  [phone]

## 2017-09-14 ENCOUNTER — Other Ambulatory Visit: Payer: Self-pay | Admitting: Internal Medicine

## 2017-09-14 DIAGNOSIS — I10 Essential (primary) hypertension: Secondary | ICD-10-CM

## 2017-09-14 LAB — COMPLETE METABOLIC PANEL WITH GFR
AG Ratio: 1.4 (calc) (ref 1.0–2.5)
ALT: 13 U/L (ref 6–29)
AST: 13 U/L (ref 10–35)
Albumin: 4.1 g/dL (ref 3.6–5.1)
Alkaline phosphatase (APISO): 60 U/L (ref 33–130)
BUN/Creatinine Ratio: 15 (calc) (ref 6–22)
BUN: 24 mg/dL (ref 7–25)
CO2: 27 mmol/L (ref 20–32)
Calcium: 9.5 mg/dL (ref 8.6–10.4)
Chloride: 104 mmol/L (ref 98–110)
Creat: 1.62 mg/dL — ABNORMAL HIGH (ref 0.60–0.93)
GFR, Est African American: 35 mL/min/{1.73_m2} — ABNORMAL LOW (ref >=60)
GFR, Est Non African American: 30 mL/min/{1.73_m2} — ABNORMAL LOW (ref >=60)
Globulin: 2.9 g/dL (calc) (ref 1.9–3.7)
Glucose, Bld: 88 mg/dL (ref 65–99)
Potassium: 4.9 mmol/L (ref 3.5–5.3)
Sodium: 140 mmol/L (ref 135–146)
Total Bilirubin: 0.5 mg/dL (ref 0.2–1.2)
Total Protein: 7 g/dL (ref 6.1–8.1)

## 2017-09-14 LAB — CBC WITH DIFFERENTIAL/PLATELET
Basophils Absolute: 68 cells/uL (ref 0–200)
Basophils Relative: 0.6 %
Eosinophils Absolute: 429 cells/uL (ref 15–500)
Eosinophils Relative: 3.8 %
HCT: 35.2 % (ref 35.0–45.0)
Hemoglobin: 12.2 g/dL (ref 11.7–15.5)
Lymphs Abs: 2949 cells/uL (ref 850–3900)
MCH: 30.7 pg (ref 27.0–33.0)
MCHC: 34.7 g/dL (ref 32.0–36.0)
MCV: 88.7 fL (ref 80.0–100.0)
MPV: 10.7 fL (ref 7.5–12.5)
Monocytes Relative: 11.6 %
Neutro Abs: 6543 cells/uL (ref 1500–7800)
Neutrophils Relative %: 57.9 %
Platelets: 275 10*3/uL (ref 140–400)
RBC: 3.97 10*6/uL (ref 3.80–5.10)
RDW: 12.5 % (ref 11.0–15.0)
Total Lymphocyte: 26.1 %
WBC mixed population: 1311 cells/uL — ABNORMAL HIGH (ref 200–950)
WBC: 11.3 10*3/uL — ABNORMAL HIGH (ref 3.8–10.8)

## 2017-09-14 LAB — LIPID PANEL
Cholesterol: 141 mg/dL (ref <200)
HDL: 49 mg/dL — ABNORMAL LOW (ref >50)
LDL Cholesterol (Calc): 69 mg/dL (calc)
Non-HDL Cholesterol (Calc): 92 mg/dL (calc) (ref <130)
Total CHOL/HDL Ratio: 2.9 (calc) (ref <5.0)
Triglycerides: 143 mg/dL (ref <150)

## 2017-09-14 LAB — HEMOGLOBIN A1C
Hgb A1c MFr Bld: 7.2 % of total Hgb — ABNORMAL HIGH (ref <5.7)
Mean Plasma Glucose: 160 (calc)
eAG (mmol/L): 8.9 (calc)

## 2017-09-21 ENCOUNTER — Other Ambulatory Visit: Payer: Medicare Other

## 2017-09-21 DIAGNOSIS — I10 Essential (primary) hypertension: Secondary | ICD-10-CM

## 2017-09-21 LAB — CBC WITH DIFFERENTIAL/PLATELET
Basophils Absolute: 63 cells/uL (ref 0–200)
Basophils Relative: 0.7 %
Eosinophils Absolute: 441 cells/uL (ref 15–500)
Eosinophils Relative: 4.9 %
HCT: 33.9 % — ABNORMAL LOW (ref 35.0–45.0)
Hemoglobin: 11.5 g/dL — ABNORMAL LOW (ref 11.7–15.5)
Lymphs Abs: 2709 cells/uL (ref 850–3900)
MCH: 30.2 pg (ref 27.0–33.0)
MCHC: 33.9 g/dL (ref 32.0–36.0)
MCV: 89 fL (ref 80.0–100.0)
MPV: 10.2 fL (ref 7.5–12.5)
Monocytes Relative: 12.5 %
Neutro Abs: 4662 cells/uL (ref 1500–7800)
Neutrophils Relative %: 51.8 %
Platelets: 281 10*3/uL (ref 140–400)
RBC: 3.81 10*6/uL (ref 3.80–5.10)
RDW: 12.5 % (ref 11.0–15.0)
Total Lymphocyte: 30.1 %
WBC mixed population: 1125 cells/uL — ABNORMAL HIGH (ref 200–950)
WBC: 9 10*3/uL (ref 3.8–10.8)

## 2017-10-11 ENCOUNTER — Other Ambulatory Visit: Payer: Self-pay | Admitting: Internal Medicine

## 2017-10-23 ENCOUNTER — Encounter: Payer: Self-pay | Admitting: *Deleted

## 2017-10-23 DIAGNOSIS — H35043 Retinal micro-aneurysms, unspecified, bilateral: Secondary | ICD-10-CM | POA: Diagnosis not present

## 2017-10-23 DIAGNOSIS — H31009 Unspecified chorioretinal scars, unspecified eye: Secondary | ICD-10-CM | POA: Diagnosis not present

## 2017-10-23 DIAGNOSIS — E113551 Type 2 diabetes mellitus with stable proliferative diabetic retinopathy, right eye: Secondary | ICD-10-CM | POA: Diagnosis not present

## 2017-10-23 DIAGNOSIS — E113552 Type 2 diabetes mellitus with stable proliferative diabetic retinopathy, left eye: Secondary | ICD-10-CM | POA: Diagnosis not present

## 2017-10-23 LAB — HM DIABETES EYE EXAM

## 2017-12-14 ENCOUNTER — Other Ambulatory Visit: Payer: Self-pay | Admitting: Internal Medicine

## 2017-12-28 ENCOUNTER — Ambulatory Visit (INDEPENDENT_AMBULATORY_CARE_PROVIDER_SITE_OTHER): Payer: Medicare Other | Admitting: Nurse Practitioner

## 2017-12-28 ENCOUNTER — Encounter: Payer: Self-pay | Admitting: Nurse Practitioner

## 2017-12-28 VITALS — BP 140/86 | HR 68 | Temp 98.0°F | Ht 64.0 in | Wt 207.0 lb

## 2017-12-28 DIAGNOSIS — D649 Anemia, unspecified: Secondary | ICD-10-CM

## 2017-12-28 DIAGNOSIS — R42 Dizziness and giddiness: Secondary | ICD-10-CM | POA: Diagnosis not present

## 2017-12-28 LAB — CBC WITH DIFFERENTIAL/PLATELET
BASOS PCT: 0.6 %
Basophils Absolute: 61 cells/uL (ref 0–200)
Eosinophils Absolute: 424 cells/uL (ref 15–500)
Eosinophils Relative: 4.2 %
HEMATOCRIT: 33.8 % — AB (ref 35.0–45.0)
HEMOGLOBIN: 11.3 g/dL — AB (ref 11.7–15.5)
LYMPHS ABS: 2565 {cells}/uL (ref 850–3900)
MCH: 29.9 pg (ref 27.0–33.0)
MCHC: 33.4 g/dL (ref 32.0–36.0)
MCV: 89.4 fL (ref 80.0–100.0)
MPV: 10.5 fL (ref 7.5–12.5)
Monocytes Relative: 13.1 %
NEUTROS ABS: 5727 {cells}/uL (ref 1500–7800)
Neutrophils Relative %: 56.7 %
Platelets: 285 10*3/uL (ref 140–400)
RBC: 3.78 10*6/uL — AB (ref 3.80–5.10)
RDW: 12.3 % (ref 11.0–15.0)
Total Lymphocyte: 25.4 %
WBC: 10.1 10*3/uL (ref 3.8–10.8)
WBCMIX: 1323 {cells}/uL — AB (ref 200–950)

## 2017-12-28 LAB — COMPLETE METABOLIC PANEL WITH GFR
AG Ratio: 1.5 (calc) (ref 1.0–2.5)
ALT: 11 U/L (ref 6–29)
AST: 15 U/L (ref 10–35)
Albumin: 3.9 g/dL (ref 3.6–5.1)
Alkaline phosphatase (APISO): 61 U/L (ref 33–130)
BILIRUBIN TOTAL: 0.5 mg/dL (ref 0.2–1.2)
BUN / CREAT RATIO: 18 (calc) (ref 6–22)
BUN: 28 mg/dL — ABNORMAL HIGH (ref 7–25)
CHLORIDE: 105 mmol/L (ref 98–110)
CO2: 24 mmol/L (ref 20–32)
Calcium: 9.2 mg/dL (ref 8.6–10.4)
Creat: 1.57 mg/dL — ABNORMAL HIGH (ref 0.60–0.93)
GFR, Est African American: 36 mL/min/{1.73_m2} — ABNORMAL LOW (ref >=60)
GFR, Est Non African American: 31 mL/min/{1.73_m2} — ABNORMAL LOW (ref >=60)
GLOBULIN: 2.6 g/dL (ref 1.9–3.7)
Glucose, Bld: 161 mg/dL — ABNORMAL HIGH (ref 65–139)
POTASSIUM: 5 mmol/L (ref 3.5–5.3)
SODIUM: 139 mmol/L (ref 135–146)
TOTAL PROTEIN: 6.5 g/dL (ref 6.1–8.1)

## 2017-12-28 LAB — TSH: TSH: 0.87 mIU/L (ref 0.40–4.50)

## 2017-12-28 MED ORDER — MECLIZINE HCL 25 MG PO TABS: 25.0000 mg | ORAL_TABLET | Freq: Three times a day (TID) | ORAL | 0 refills | Status: DC | PRN

## 2017-12-28 NOTE — Patient Instructions (Addendum)
Will get lab work today Continue meclizine 25 mg as needed dizziness To change positions slowly when getting up from sitting to standing.

## 2017-12-28 NOTE — Progress Notes (Signed)
Careteam: Patient Care Team: Gayland Curry, DO as PCP - General (Geriatric Medicine) Zadie Rhine Clent Demark, MD as Consulting Physician (Ophthalmology)  Advanced Directive information Does Patient Have a Medical Advance Directive?: No, Type of Advance Directive: Out of facility DNR (pink MOST or yellow form), Pre-existing out of facility DNR order (yellow form or pink MOST form): Yellow form placed in chart (order not valid for inpatient use), Does patient want to make changes to medical advance directive?: Yes (MAU/Ambulatory/Procedural Areas - Information given)  Allergies  Allergen Reactions  . Naproxen Swelling and Other (See Comments)    Blacked out - Face swelling   . Sulfur Hives  . Tramadol Nausea And Vomiting    Chief Complaint  Patient presents with  . Acute Visit    Pt is being seen due to off/on dizziness and she thinks she may have fluid in her ears.      HPI: Patient is a 82 y.o. female seen in the office today been getting dizzy off and on for about a year. Question if she has fluid in her ear.  Does not have headache, nausea, vomiting, no ringing in ears.  Gets up really slow First started noticing dizziness when she would stand up in church.  Will notice it when she turns her head.  Dizziness only last a few seconds.  Took 3 meclizines the day before yesterday to get her work done and that helps.  Once she sits down dizziness is gone.   Decrease in energy. She was mildly anemic on last labs. Still able to get up and do a lot of things around the house. Changed 2 beds, potted 2 planets did her usual house work.  Having to build up her activity after knee problems.   Review of Systems:  Review of Systems  Constitutional: Negative for chills, fever and malaise/fatigue.  HENT: Positive for hearing loss (wears hearing aids). Negative for congestion, ear discharge, ear pain, sinus pain, sore throat and tinnitus.        Throat congestion  Eyes: Negative for blurred  vision, photophobia and pain.       Dry eyes  Respiratory: Negative for cough and shortness of breath.   Cardiovascular: Negative for chest pain.  Neurological: Positive for dizziness. Negative for tingling, sensory change, speech change, focal weakness, weakness and headaches.    Past Medical History:  Diagnosis Date  . Anxiety    regarding surgery; occassionally pt. states  . Anxiety state, unspecified   . Arthritis   . Asymptomatic varicose veins   . Chronic kidney disease   . Diabetic retinopathy ([place])   . Edema   . Hard of hearing   . Hyperpotassemia   . Hypertension   . Inflammation of joint of knee    right knee  . Insomnia, unspecified   . Multiple thyroid nodules   . Osteoarthrosis, unspecified whether generalized or localized, unspecified site   . Other abnormal blood chemistry   . Other and unspecified hyperlipidemia   . Other malaise and fatigue   . Other specified disease of nail   . Overweight(278.02)   . Pain in joint, lower leg   . Pain in joint, pelvic region and thigh   . Peripheral vascular disease ([place])    varicose veins-  "Blood Clot anterior left LEG WITH PREGNANCY"  . PONV (postoperative nausea and vomiting)    pt. states that she was sick after hip surgery and oral surgery  . Type II or unspecified  type diabetes mellitus with renal manifestations, not stated as uncontrolled(250.40)   . Undiagnosed cardiac murmurs   . Unspecified disorder of kidney and ureter   . Unspecified disorder of kidney and ureter   . Urinary tract infection, site not specified    Past Surgical History:  Procedure Laterality Date  . ABDOMINAL HYSTERECTOMY    . EYE SURGERY Bilateral    cataract extraction with IOL  . LIPOMA EXCISION     x2  bilateral buttocks  . spinal meningitis  1957  . TONSILLECTOMY    . TOTAL HIP ARTHROPLASTY Right 11/29/2012   Procedure: RIGHT TOTAL HIP ARTHROPLASTY ANTERIOR APPROACH;  Surgeon: Mcarthur Rossetti, MD;  Location: [facility];  Service:  Orthopedics;  Laterality: Right;  . TOTAL HIP ARTHROPLASTY Left 06/01/2015   Procedure: LEFT TOTAL HIP ARTHROPLASTY ANTERIOR APPROACH;  Surgeon: Mcarthur Rossetti, MD;  Location: [facility];  Service: Orthopedics;  Laterality: Left;   Social History:   reports that she has never smoked. She has never used smokeless tobacco. She reports that she does not drink alcohol or use drugs.  History reviewed. No pertinent family history.  Medications: Patient's Medications  New Prescriptions   No medications on file  Previous Medications   ACCU-CHEK FASTCLIX LANCETS MISC    Test blood sugar twice daily DX E08.319   ACCU-CHEK SMARTVIEW TEST STRIP    CHECK BLOOD SUGAR TWO TIMES DAILY   ACETAMINOPHEN (TYLENOL) 500 MG TABLET    Take 1,000 mg by mouth every 8 (eight) hours as needed for mild pain or moderate pain.   ASPIRIN EC 81 MG TABLET    Take 81 mg by mouth daily.   DICLOFENAC SODIUM (VOLTAREN) 1 % GEL    APPLY TOPICALLY 2 TO 4  GRAMS UP TO 4 TIMES DAILY   FUROSEMIDE (LASIX) 20 MG TABLET    TAKE 1 TABLET BY MOUTH  DAILY FOR BLOOD PRESSURE  AND EDEMA   GLIPIZIDE (GLUCOTROL) 5 MG TABLET    TAKE 1 TABLET BY MOUTH  TWICE A DAY BEFORE MEALS  (BREAKFAST AND LUNCH)   LOSARTAN (COZAAR) 50 MG TABLET    TAKE ONE TABLET BY MOUTH  TWICE DAILY TO CONTROL  BLOOD PRESSURE   METFORMIN (GLUCOPHAGE) 500 MG TABLET    TAKE 2 TABLETS BY MOUTH TWO TIMES DAILY WITH MEALS   PROPYLENE GLYCOL (SYSTANE BALANCE OP)    Apply 1 drop to eye daily as needed (dry eyes).   SIMVASTATIN (ZOCOR) 40 MG TABLET    TAKE 1 TABLET BY MOUTH  DAILY  Modified Medications   No medications on file  Discontinued Medications   No medications on file     Physical Exam:  Vitals:   12/28/17 1048  BP: 140/86  Pulse: 68  Temp: 98 F (36.7 C)  TempSrc: Oral  SpO2: 97%  Weight: 207 lb (93.9 kg)  Height: 5\' 4"  (1.626 m)   Body mass index is 35.53 kg/m.  Physical Exam  Constitutional: She is oriented to person, place, and time. She  appears well-developed and well-nourished. No distress.  HENT:  Head: Normocephalic and atraumatic.  Right Ear: External ear normal.  Left Ear: External ear normal.  Nose: Nose normal.  Mouth/Throat: Oropharynx is clear and moist. No oropharyngeal exudate.  Eyes: Pupils are equal, round, and reactive to light. Conjunctivae and EOM are normal.  Neck: Normal range of motion. Neck supple.  Cardiovascular: Normal rate, regular rhythm and normal heart sounds.  Pulmonary/Chest: Effort normal and breath sounds normal. No  respiratory distress.  Abdominal: Soft. Bowel sounds are normal.  Neurological: She is alert and oriented to person, place, and time. She has normal strength. She displays a negative Romberg sign.  Skin: Skin is warm and dry. Capillary refill takes less than 2 seconds.  Psychiatric: She has a normal mood and affect.    Labs reviewed: Basic Metabolic Panel: Recent Labs    02/23/17 1012 09/13/17 1048  NA 141 140  K 5.0 4.9  CL 106 104  CO2 22 27  GLUCOSE 141* 88  BUN 21 24  CREATININE 1.50* 1.62*  CALCIUM 9.2 9.5   Liver Function Tests: Recent Labs    02/23/17 1012 09/13/17 1048  AST 14 13  ALT 10 13  ALKPHOS 61  --   BILITOT 0.4 0.5  PROT 6.7 7.0  ALBUMIN 4.1  --    No results for input(s): LIPASE, AMYLASE in the last 8760 hours. No results for input(s): AMMONIA in the last 8760 hours. CBC: Recent Labs    02/23/17 1012 09/13/17 1048 09/21/17 0855  WBC 8.9 11.3* 9.0  NEUTROABS 4,984 6,543 4,662  HGB 11.8 12.2 11.5*  HCT 36.1 35.2 33.9*  MCV 92.1 88.7 89.0  PLT 256 275 281   Lipid Panel: Recent Labs    02/23/17 1012 09/13/17 1048  CHOL 132 141  HDL 53 49*  LDLCALC 58 69  TRIG 107 143  CHOLHDL 2.5 2.9   TSH: No results for input(s): TSH in the last 8760 hours. A1C: Lab Results  Component Value Date   HGBA1C 7.2 (H) 09/13/2017     Assessment/Plan 1. Dizziness -orthostatics negative however encouraged to change positions slowly.    -appears to be BPPV  -to continue meclizine 25 mg TID which has been effective without side effects noted.  - CBC with Differential/Platelets - COMPLETE METABOLIC PANEL WITH GFR - TSH  2. Anemia, unspecified type -no signs of blood loss, follow up hgb today - CBC with Differential/Platelets  Next appt: 03/14/2018, sooner if needed  Janett Billow K. [place], [facility] [phone]

## 2018-02-11 ENCOUNTER — Other Ambulatory Visit: Payer: Self-pay | Admitting: Internal Medicine

## 2018-03-07 ENCOUNTER — Other Ambulatory Visit: Payer: Self-pay | Admitting: Internal Medicine

## 2018-03-07 ENCOUNTER — Other Ambulatory Visit: Payer: Self-pay | Admitting: *Deleted

## 2018-03-07 MED ORDER — GLUCOSE BLOOD VI STRP: ORAL_STRIP | 1 refills | Status: DC

## 2018-03-07 NOTE — Telephone Encounter (Signed)
[facility] 

## 2018-03-14 ENCOUNTER — Encounter: Payer: Medicare Other | Admitting: Internal Medicine

## 2018-04-24 ENCOUNTER — Other Ambulatory Visit: Payer: Self-pay | Admitting: Internal Medicine

## 2018-06-13 ENCOUNTER — Ambulatory Visit (INDEPENDENT_AMBULATORY_CARE_PROVIDER_SITE_OTHER): Payer: Medicare Other | Admitting: Internal Medicine

## 2018-06-13 ENCOUNTER — Encounter: Payer: Self-pay | Admitting: Internal Medicine

## 2018-06-13 VITALS — BP 140/88 | HR 85 | Temp 98.2°F | Ht 64.0 in | Wt 196.0 lb

## 2018-06-13 DIAGNOSIS — H8110 Benign paroxysmal vertigo, unspecified ear: Secondary | ICD-10-CM | POA: Diagnosis not present

## 2018-06-13 DIAGNOSIS — M1731 Unilateral post-traumatic osteoarthritis, right knee: Secondary | ICD-10-CM

## 2018-06-13 DIAGNOSIS — Z0000 Encounter for general adult medical examination without abnormal findings: Secondary | ICD-10-CM | POA: Diagnosis not present

## 2018-06-13 DIAGNOSIS — E669 Obesity, unspecified: Secondary | ICD-10-CM

## 2018-06-13 DIAGNOSIS — E041 Nontoxic single thyroid nodule: Secondary | ICD-10-CM

## 2018-06-13 DIAGNOSIS — Z23 Encounter for immunization: Secondary | ICD-10-CM

## 2018-06-13 DIAGNOSIS — E08319 Diabetes mellitus due to underlying condition with unspecified diabetic retinopathy without macular edema: Secondary | ICD-10-CM

## 2018-06-13 DIAGNOSIS — Z6830 Body mass index (BMI) 30.0-30.9, adult: Secondary | ICD-10-CM

## 2018-06-13 DIAGNOSIS — I10 Essential (primary) hypertension: Secondary | ICD-10-CM

## 2018-06-13 DIAGNOSIS — F411 Generalized anxiety disorder: Secondary | ICD-10-CM

## 2018-06-13 DIAGNOSIS — E781 Pure hyperglyceridemia: Secondary | ICD-10-CM | POA: Diagnosis not present

## 2018-06-13 NOTE — Progress Notes (Signed)
Provider:  Rexene Edison. Mariea Clonts, D.O., C.M.D. Location:   [facility]  Place of Service:   clinic  Previous PCP: Gayland Curry, DO Patient Care Team: Gayland Curry, DO as PCP - General (Geriatric Medicine) Zadie Rhine Clent Demark, MD as Consulting Physician (Ophthalmology)  Extended Emergency Contact Information Primary Emergency Contact: [facility] Address: [place], [place] [address] Johnnette Litter of [place] Phone: [phone] Mobile Phone: [phone] Relation: Daughter Secondary Emergency Contact: Lajeunesse,[name]  [place] Phone: [phone] Relation: Son  Code Status: DNR Goals of Care: Advanced Directive information Advanced Directives 06/13/2018  Does Patient Have a Medical Advance Directive? Yes  Type of Advance Directive Out of facility DNR (pink MOST or yellow form)  Does patient want to make changes to medical advance directive? No - Patient declined  Copy of [place] in Chart? -  Would patient like information on creating a medical advance directive? -  Pre-existing out of facility DNR order (yellow form or pink MOST form) Yellow form placed in chart (order not valid for inpatient use)   Chief Complaint  Patient presents with  . Annual Exam    CPE    HPI: Patient is a 82 y.o. female seen today for an annual physical exam.   Having episodes of vertigo.  Stopped by the grocery story and was going to pick up a coffee cup at her grandson's garage.  It felt like the room was spinning.  She had to sit down and then it goes away.  She may go a few days w/o it, but then it comes back.  Happens when walking.  She's done the exercises at home, but struggles to do on her own.  Wants to get therapy for it.  Says her right ear had fluid in it.  Hears squishy sounds in there.    BP upper limits normal.  She is stressed about her husband's health.  They've been married 27 years.    Her husband had a heart attack  recently and more heart damage and decreasing heart function.    She's lost 11 lbs and kept off since June.  Says her kids and grandkids are excellent.    EKG reviewed.  Nonspecific T wave abnormalities.     Past Medical History:  Diagnosis Date  . Anxiety    regarding surgery; occassionally pt. states  . Anxiety state, unspecified   . Arthritis   . Asymptomatic varicose veins   . Chronic kidney disease   . Diabetic retinopathy ([place])   . Edema   . Hard of hearing   . Hyperpotassemia   . Hypertension   . Inflammation of joint of knee    right knee  . Insomnia, unspecified   . Multiple thyroid nodules   . Osteoarthrosis, unspecified whether generalized or localized, unspecified site   . Other abnormal blood chemistry   . Other and unspecified hyperlipidemia   . Other malaise and fatigue   . Other specified disease of nail   . Overweight(278.02)   . Pain in joint, lower leg   . Pain in joint, pelvic region and thigh   . Peripheral vascular disease ([place])    varicose veins-  "Blood Clot anterior left LEG WITH PREGNANCY"  . PONV (postoperative nausea and vomiting)    pt. states that she was sick after hip surgery and oral surgery  . Type II or unspecified type diabetes mellitus with renal  manifestations, not stated as uncontrolled(250.40)   . Undiagnosed cardiac murmurs   . Unspecified disorder of kidney and ureter   . Unspecified disorder of kidney and ureter   . Urinary tract infection, site not specified    Past Surgical History:  Procedure Laterality Date  . ABDOMINAL HYSTERECTOMY    . EYE SURGERY Bilateral    cataract extraction with IOL  . LIPOMA EXCISION     x2  bilateral buttocks  . spinal meningitis  1957  . TONSILLECTOMY    . TOTAL HIP ARTHROPLASTY Right 11/29/2012   Procedure: RIGHT TOTAL HIP ARTHROPLASTY ANTERIOR APPROACH;  Surgeon: Mcarthur Rossetti, MD;  Location: [facility];  Service: Orthopedics;  Laterality: Right;  . TOTAL HIP ARTHROPLASTY Left  06/01/2015   Procedure: LEFT TOTAL HIP ARTHROPLASTY ANTERIOR APPROACH;  Surgeon: Mcarthur Rossetti, MD;  Location: [facility];  Service: Orthopedics;  Laterality: Left;    reports that she has never smoked. She has never used smokeless tobacco. She reports that she does not drink alcohol or use drugs.  Functional Status Survey:    No family history on file.  Health Maintenance  Topic Date Due  . FOOT EXAM  02/23/2018  . HEMOGLOBIN A1C  03/13/2018  . INFLUENZA VACCINE  03/28/2018  . DEXA SCAN  08/29/2023 (Originally 04/12/2003)  . OPHTHALMOLOGY EXAM  10/23/2018  . PNA vac Low Risk Adult  Completed    Allergies  Allergen Reactions  . Naproxen Swelling and Other (See Comments)    Blacked out - Face swelling   . Sulfur Hives  . Tramadol Nausea And Vomiting    Outpatient Encounter Medications as of 06/13/2018  Medication Sig  . ACCU-CHEK FASTCLIX LANCETS MISC Test blood sugar twice daily DX E08.319  . acetaminophen (TYLENOL) 500 MG tablet Take 1,000 mg by mouth every 8 (eight) hours as needed for mild pain or moderate pain.  Marland Kitchen aspirin EC 81 MG tablet Take 81 mg by mouth daily.  . diclofenac sodium (VOLTAREN) 1 % GEL APPLY TOPICALLY 2 TO 4  GRAMS UP TO 4 TIMES DAILY  . furosemide (LASIX) 20 MG tablet TAKE 1 TABLET BY MOUTH  DAILY FOR BLOOD PRESSURE  AND EDEMA  . glipiZIDE (GLUCOTROL) 5 MG tablet TAKE 1 TABLET BY MOUTH  TWICE A DAY BEFORE MEALS  (BREAKFAST AND LUNCH)  . glucose blood (ACCU-CHEK SMARTVIEW) test strip Use to check blood sugar twice daily. Dx: E11.319  . losartan (COZAAR) 50 MG tablet TAKE ONE TABLET BY MOUTH  TWICE DAILY TO CONTROL  BLOOD PRESSURE  . meclizine (ANTIVERT) 25 MG tablet Take 1 tablet (25 mg total) by mouth 3 (three) times daily as needed for dizziness.  . metFORMIN (GLUCOPHAGE) 500 MG tablet TAKE 2 TABLETS BY MOUTH TWO TIMES DAILY WITH MEALS  . Propylene Glycol (SYSTANE BALANCE OP) Apply 1 drop to eye daily as needed (dry eyes).  . simvastatin (ZOCOR) 40  MG tablet TAKE 1 TABLET BY MOUTH  DAILY   No facility-administered encounter medications on file as of 06/13/2018.     Review of Systems  Constitutional: Positive for weight loss. Negative for chills, fever and malaise/fatigue.  HENT: Positive for hearing loss.        Bilateral hearing aids, wax in right ear obscuring view; feels like fluid in right ear  Eyes: Negative for blurred vision.  Respiratory: Negative for cough and shortness of breath.   Cardiovascular: Negative for chest pain, palpitations and leg swelling.  Gastrointestinal: Negative for abdominal pain, blood in stool,  constipation, diarrhea and melena.  Genitourinary: Negative for dysuria.  Musculoskeletal: Positive for joint pain. Negative for falls.       Right knee pain  Skin: Negative for itching and rash.  Neurological: Positive for dizziness. Negative for sensory change, loss of consciousness, weakness and headaches.  Endo/Heme/Allergies: Bruises/bleeds easily.  Psychiatric/Behavioral: Negative for depression and memory loss. The patient is nervous/anxious. The patient does not have insomnia.     Vitals:   06/13/18 0913  BP: 140/88  Pulse: 85  Temp: 98.2 F (36.8 C)  TempSrc: Oral  SpO2: 98%  Weight: 196 lb (88.9 kg)  Height: 5\' 4"  (1.626 m)   Body mass index is 33.64 kg/m. Physical Exam  Constitutional: She is oriented to person, place, and time. She appears well-developed and well-nourished. No distress.  HENT:  Head: Normocephalic and atraumatic.  Right Ear: External ear normal.  Left Ear: External ear normal.  Nose: Nose normal.  Mouth/Throat: Oropharynx is clear and moist. No oropharyngeal exudate.  Bilateral hearing aids; right ear with cerumen in canal  Eyes: Pupils are equal, round, and reactive to light. Conjunctivae and EOM are normal.  Neck: Normal range of motion. Neck supple. No JVD present. No tracheal deviation present. No thyromegaly present.  Cardiovascular: Normal rate, regular  rhythm, normal heart sounds and intact distal pulses.  Pulmonary/Chest: Effort normal and breath sounds normal. No respiratory distress.  Abdominal: Soft. Bowel sounds are normal. She exhibits no distension. There is no tenderness.  Musculoskeletal: Normal range of motion.  Warmth and swelling of right knee, not tender to palpation though  Lymphadenopathy:    She has no cervical adenopathy.  Neurological: She is alert and oriented to person, place, and time. She displays normal reflexes. No cranial nerve deficit or sensory deficit. She exhibits normal muscle tone. Coordination normal.  Skin: Skin is warm and dry. Capillary refill takes less than 2 seconds.  Psychiatric: She has a normal mood and affect. Her behavior is normal. Judgment and thought content normal.    Labs reviewed: Basic Metabolic Panel: Recent Labs    09/13/17 1048 12/28/17 1136  NA 140 139  K 4.9 5.0  CL 104 105  CO2 27 24  GLUCOSE 88 161*  BUN 24 28*  CREATININE 1.62* 1.57*  CALCIUM 9.5 9.2   Liver Function Tests: Recent Labs    09/13/17 1048 12/28/17 1136  AST 13 15  ALT 13 11  BILITOT 0.5 0.5  PROT 7.0 6.5   No results for input(s): LIPASE, AMYLASE in the last 8760 hours. No results for input(s): AMMONIA in the last 8760 hours. CBC: Recent Labs    09/13/17 1048 09/21/17 0855 12/28/17 1136  WBC 11.3* 9.0 10.1  NEUTROABS 6,543 4,662 5,727  HGB 12.2 11.5* 11.3*  HCT 35.2 33.9* 33.8*  MCV 88.7 89.0 89.4  PLT 275 281 285   Cardiac Enzymes: No results for input(s): CKTOTAL, CKMB, CKMBINDEX, TROPONINI in the last 8760 hours. BNP: Invalid input(s): POCBNP Lab Results  Component Value Date   HGBA1C 7.2 (H) 09/13/2017   Lab Results  Component Value Date   TSH 0.87 12/28/2017   Assessment/Plan 1. Annual physical exam -performed today, doing well -up to date except tdap but just got flu today  2. Essential hypertension - bp just a bit above goal, but her husband is ill and she's extra  anxious - EKG 12-Lead performed today and benign  3. Benign paroxysmal positional vertigo, unspecified laterality - Ambulatory referral to Physical Therapy for her vertigo -right  ear cerumen also flushed  4. Diabetes mellitus due to underlying condition with retinopathy of both eyes, without long-term current use of insulin, macular edema presence unspecified, unspecified retinopathy severity ([place]) -f/u hba1c today  5. Post-traumatic osteoarthritis of right knee -cont voltaren and try to walk  6. Generalized anxiety disorder -worse with her husband's illness, cont current meds  7. Hypertriglyceridemia -f/u flp  8. Need for influenza vaccination - Flu vaccine HIGH DOSE PF (Fluzone High dose)  9.  Obesity 10. bmi 30-39 -did lose weight and will see if labs better  Labs/tests ordered:   Orders Placed This Encounter  Procedures  . Flu vaccine HIGH DOSE PF (Fluzone High dose)  . CBC with Differential/Platelet  . COMPLETE METABOLIC PANEL WITH GFR  . Hemoglobin A1c  . Lipid panel  . TSH  . Ambulatory referral to Physical Therapy    Referral Priority:   Routine    Referral Type:   Physical Medicine    Referral Reason:   Specialty Services Required    Requested Specialty:   Physical Therapy    Number of Visits Requested:   1  . EKG 12-Lead     Fawaz Borquez L. Shawan Tosh, D.O. [facility] [address] [place] [address], [place] [address] Cell Phone (Mon-Fri 8am-5pm):  [phone] On Call:  [phone] & follow prompts after 5pm & weekends Office Phone:  [phone] Office Fax:  [phone]

## 2018-06-14 ENCOUNTER — Encounter: Payer: Self-pay | Admitting: *Deleted

## 2018-06-14 LAB — COMPLETE METABOLIC PANEL WITH GFR
AG Ratio: 1.6 (calc) (ref 1.0–2.5)
ALT: 12 U/L (ref 6–29)
AST: 15 U/L (ref 10–35)
Albumin: 4.1 g/dL (ref 3.6–5.1)
Alkaline phosphatase (APISO): 63 U/L (ref 33–130)
BUN/Creatinine Ratio: 12 (calc) (ref 6–22)
BUN: 18 mg/dL (ref 7–25)
CO2: 27 mmol/L (ref 20–32)
Calcium: 9.4 mg/dL (ref 8.6–10.4)
Chloride: 103 mmol/L (ref 98–110)
Creat: 1.54 mg/dL — ABNORMAL HIGH (ref 0.60–0.88)
GFR, Est African American: 37 mL/min/{1.73_m2} — ABNORMAL LOW (ref >=60)
GFR, Est Non African American: 32 mL/min/{1.73_m2} — ABNORMAL LOW (ref >=60)
Globulin: 2.6 g/dL (calc) (ref 1.9–3.7)
Glucose, Bld: 131 mg/dL — ABNORMAL HIGH (ref 65–99)
Potassium: 5.2 mmol/L (ref 3.5–5.3)
Sodium: 140 mmol/L (ref 135–146)
Total Bilirubin: 0.5 mg/dL (ref 0.2–1.2)
Total Protein: 6.7 g/dL (ref 6.1–8.1)

## 2018-06-14 LAB — CBC WITH DIFFERENTIAL/PLATELET
Basophils Absolute: 52 cells/uL (ref 0–200)
Basophils Relative: 0.5 %
Eosinophils Absolute: 291 cells/uL (ref 15–500)
Eosinophils Relative: 2.8 %
HCT: 34.4 % — ABNORMAL LOW (ref 35.0–45.0)
Hemoglobin: 11.9 g/dL (ref 11.7–15.5)
Lymphs Abs: 2517 cells/uL (ref 850–3900)
MCH: 30.7 pg (ref 27.0–33.0)
MCHC: 34.6 g/dL (ref 32.0–36.0)
MCV: 88.9 fL (ref 80.0–100.0)
MPV: 10.5 fL (ref 7.5–12.5)
Monocytes Relative: 10.9 %
Neutro Abs: 6406 cells/uL (ref 1500–7800)
Neutrophils Relative %: 61.6 %
Platelets: 274 10*3/uL (ref 140–400)
RBC: 3.87 10*6/uL (ref 3.80–5.10)
RDW: 12.5 % (ref 11.0–15.0)
Total Lymphocyte: 24.2 %
WBC mixed population: 1134 cells/uL — ABNORMAL HIGH (ref 200–950)
WBC: 10.4 10*3/uL (ref 3.8–10.8)

## 2018-06-14 LAB — TSH: TSH: 1.07 mIU/L (ref 0.40–4.50)

## 2018-06-14 LAB — HEMOGLOBIN A1C
Hgb A1c MFr Bld: 7.2 % of total Hgb — ABNORMAL HIGH (ref <5.7)
Mean Plasma Glucose: 160 (calc)
eAG (mmol/L): 8.9 (calc)

## 2018-06-14 LAB — LIPID PANEL
Cholesterol: 137 mg/dL (ref <200)
HDL: 51 mg/dL (ref >50)
LDL Cholesterol (Calc): 64 mg/dL (calc)
Non-HDL Cholesterol (Calc): 86 mg/dL (calc) (ref <130)
Total CHOL/HDL Ratio: 2.7 (calc) (ref <5.0)
Triglycerides: 140 mg/dL (ref <150)

## 2018-06-18 ENCOUNTER — Ambulatory Visit (HOSPITAL_COMMUNITY): Payer: Medicare Other | Attending: Internal Medicine

## 2018-06-18 ENCOUNTER — Encounter (HOSPITAL_COMMUNITY): Payer: Self-pay

## 2018-06-18 ENCOUNTER — Other Ambulatory Visit: Payer: Self-pay

## 2018-06-18 DIAGNOSIS — R42 Dizziness and giddiness: Secondary | ICD-10-CM | POA: Diagnosis not present

## 2018-06-18 DIAGNOSIS — I951 Orthostatic hypotension: Secondary | ICD-10-CM | POA: Diagnosis not present

## 2018-06-18 NOTE — Therapy (Signed)
[facility] [place], [place], [address] Phone: [phone]   Fax:  [phone]  Physical Therapy Evaluation  Patient Details  Name: Julie Baxter MRN: [id number] Date of Birth: 10/16/1935 Referring Provider (PT): Hollace Kinnier, DO   Encounter Date: 06/18/2018  PT End of Session - 06/18/18 1247    Visit Number  1    Number of Visits  3    Date for PT Re-Evaluation  07/02/18    Authorization Type  [employer] Medicare (primary and secondary)    Authorization Time Period  06/18/18 to 07/02/18    PT Start Time  0812    PT Stop Time  0858    PT Time Calculation (min)  46 min    Activity Tolerance  Patient tolerated treatment well    Behavior During Therapy  Operating Room Services for tasks assessed/performed       Past Medical History:  Diagnosis Date  . Anxiety    regarding surgery; occassionally pt. states  . Anxiety state, unspecified   . Arthritis   . Asymptomatic varicose veins   . Chronic kidney disease   . Diabetic retinopathy ([place])   . Edema   . Hard of hearing   . Hyperpotassemia   . Hypertension   . Inflammation of joint of knee    right knee  . Insomnia, unspecified   . Multiple thyroid nodules   . Osteoarthrosis, unspecified whether generalized or localized, unspecified site   . Other abnormal blood chemistry   . Other and unspecified hyperlipidemia   . Other malaise and fatigue   . Other specified disease of nail   . Overweight(278.02)   . Pain in joint, lower leg   . Pain in joint, pelvic region and thigh   . Peripheral vascular disease ([place])    varicose veins-  "Blood Clot anterior left LEG WITH PREGNANCY"  . PONV (postoperative nausea and vomiting)    pt. states that she was sick after hip surgery and oral surgery  . Type II or unspecified type diabetes mellitus with renal manifestations, not stated as uncontrolled(250.40)   . Undiagnosed cardiac murmurs   . Unspecified disorder of kidney and ureter   . Unspecified  disorder of kidney and ureter   . Urinary tract infection, site not specified     Past Surgical History:  Procedure Laterality Date  . ABDOMINAL HYSTERECTOMY    . EYE SURGERY Bilateral    cataract extraction with IOL  . LIPOMA EXCISION     x2  bilateral buttocks  . spinal meningitis  1957  . TONSILLECTOMY    . TOTAL HIP ARTHROPLASTY Right 11/29/2012   Procedure: RIGHT TOTAL HIP ARTHROPLASTY ANTERIOR APPROACH;  Surgeon: Mcarthur Rossetti, MD;  Location: [facility];  Service: Orthopedics;  Laterality: Right;  . TOTAL HIP ARTHROPLASTY Left 06/01/2015   Procedure: LEFT TOTAL HIP ARTHROPLASTY ANTERIOR APPROACH;  Surgeon: Mcarthur Rossetti, MD;  Location: [facility];  Service: Orthopedics;  Laterality: Left;    There were no vitals filed for this visit.   Subjective Assessment - 06/18/18 0820    Subjective  Pt states that she has been getting dizzy and it has progressively worsened over the last 2 years. It occurs sometimes every day but she could go about 10 days without having any symptoms. She states that her MD flushed her ear out and she states she feels it has gotten more intense since then. She describes her symptoms as slowly spinning and "losing focus and  balance." She has to sit down in order to not fall; no reports of falls but she does have close calls. The symptoms last a couple of minutes and will subside after a few mins of rest. She is not sure that of any positional changes that cause her symptoms. She can't think of any causative factors and states that it comes on out of nowhere.     Limitations  Walking;Standing    Patient Stated Goals  get rid of this    Currently in Pain?  No/denies         [facility] PT Assessment - 06/18/18 0001      Assessment   Medical Diagnosis  BPPV    Referring Provider (PT)  Hollace Kinnier, DO    Onset Date/Surgical Date  --   worsening over last 2years   Next MD Visit  6 months    Prior Therapy  none for current issue      Balance Screen    Has the patient fallen in the past 6 months  No    Has the patient had a decrease in activity level because of a fear of falling?   No    Is the patient reluctant to leave their home because of a fear of falling?   No      Prior Function   Level of Independence  Independent    Vocation  Retired    Art therapist, yard work           Vestibular Assessment - 06/18/18 0001      Symptom Behavior   Type of Dizziness  Comment   pt with difficulty describing symptoms   Duration of Dizziness  couple mins    Aggravating Factors  Sit to stand   walking    Relieving Factors  Rest;Closing eyes      Occulomotor Exam   Head shaking Horizontal  Absent    Smooth Pursuits  Intact    Saccades  Intact      Positional Testing   Dix-Hallpike  Dix-Hallpike Right;Dix-Hallpike Left    Horizontal Canal Testing  Horizontal Canal Right;Horizontal Canal Left      Dix-Hallpike Right   Dix-Hallpike Right Duration  no symptoms    Dix-Hallpike Right Symptoms  No nystagmus      Dix-Hallpike Left   Dix-Hallpike Left Duration  no symptoms    Dix-Hallpike Left Symptoms  No nystagmus      Horizontal Canal Right   Horizontal Canal Right Duration  no symptoms    Horizontal Canal Right Symptoms  Normal      Horizontal Canal Left   Horizontal Canal Left Duration  no symptoms     Horizontal Canal Left Symptoms  Normal      Orthostatics   BP supine (x 5 minutes)  150/90    BP sitting  118/80   pt reported feeling minor symptoms after sitting up to EOB   BP standing (after 3 minutes)  115/70           Objective measurements completed on examination: See above findings.        PT Education - 06/18/18 1246    Education Details  exam findings, POC    Person(s) Educated  Patient    Methods  Explanation    Comprehension  Verbalized understanding       PT Short Term Goals - 06/18/18 1611      PT SHORT TERM GOAL #1   Title  Pt will be  able to perform bil SLS for 5 sec without UE support  in order to demo decreased risk for falls and to improve gait.    Time  2    Period  Weeks    Status  New    [place] Date  07/02/18      PT SHORT TERM GOAL #2   Title  Pt will report decreased frequency of symptoms to 2x/week or < in order to allow her to grocery shop without having to rest in order to improve community access.    Time  2    Period  Weeks    Status  New        PT Long Term Goals - 06/18/18 1614      PT LONG TERM GOAL #1   Title  n/a - pt likely to be d/c within 1-2 weeks             Plan - 06/18/18 1606    Clinical Impression Statement  Pt is pleasant 82YO F who presents to OPPT with c/o dizziness. Pt denied any positional causative factors, stating that it happens when she either stands up from sitting or while she is walking. She reports that when it happens, she has to sit down/rest because it feels like she is going to pass out. She states that she can be walking around in the grocery store and by the end when she is standing in line, her symptoms come on and she has to put her head down with her eyes closed to get the symptoms to subside. Smooth pursuits, saccades, VOR all WNL, only 1 minor bout of overshooting during smooth pursuits. Dix-hallpike testing for all canals negative without recreation of symptoms. Due to pt reporting her symptoms mostly occur after standing and during ambulation, PT assessed orthostatics. She was noted to be 150/90 supine and then once EOB pt with significant drop in BP to 118/80 and pt reporting mild recreation of symptoms while EOB. Once standing, this PT botched the initial BP reading so waited ~60sec to allow arterial recovery and then took measurement again and her BP had dropped again to 115/70; this PT feels that her BP readings would have been even lower in standing had the initial reading been accurate. PT feels that pt's complaints are due to orthostatic hypotension and not positional vertigo. Will have pt f/u for 1x/week for 2  weeks for further vestibular and balance assessment and to r/o other potential contributing factors, however, feel pt's symptoms will have to be managed medically.     Clinical Presentation  Stable    Clinical Presentation due to:  see flow sheets for objective testing    Clinical Decision Making  Low    Rehab Potential  Fair    PT Frequency  1x / week    PT Duration  2 weeks    PT Treatment/Interventions  ADLs/Self Care Home Management;Canalith Repostioning;Cryotherapy;Electrical Stimulation;Ultrasound;DME Instruction;Gait training;Stair training;Functional mobility training;Therapeutic activities;Therapeutic exercise;Balance training;Neuromuscular re-education;Patient/family education;Manual techniques;Energy conservation;Vestibular    PT Next Visit Plan  further vestibular and balance assessments to r/i and/or r/o other potential causes to pt's symptoms; discharge at next visit if PTs still feel her symptoms are due to BP and no other major contributing findings    PT Home Exercise Plan  none    Consulted and Agree with Plan of Care  Patient       Patient will benefit from skilled therapeutic intervention in order to improve the following deficits and impairments:  Decreased balance, Decreased activity tolerance, Dizziness, Difficulty walking, Impaired perceived functional ability  Visit Diagnosis: Orthostatic hypotension - Plan: PT plan of care cert/re-cert  Dizziness and giddiness - Plan: PT plan of care cert/re-cert     Problem List Patient Active Problem List   Diagnosis Date Noted  . Advance care planning 09/13/2017  . Orthostatic dizziness 12/16/2015  . Diabetes mellitus due to underlying condition with retinopathy ([place]) 12/16/2015  . Hypertriglyceridemia 12/16/2015  . Post-traumatic osteoarthritis of right knee 12/16/2015  . Essential hypertension 12/16/2015  . Osteoarthritis of left hip 06/01/2015  . Status post total replacement of left hip 06/01/2015  . Dizzy spells  12/09/2014  . Cystic thyroid nodule 12/09/2014  . Generalized anxiety disorder 11/25/2014  . Diabetic retinopathy ([place])   . Other and unspecified hyperlipidemia   . Degenerative arthritis of hip 11/29/2012       Geraldine Solar PT, DPT  [facility] [address] [place], [place], [address] Phone: [phone]   Fax:  [phone]  Name: NAUTICA HOTZ MRN: [id number] Date of Birth: 1935-10-29

## 2018-06-24 ENCOUNTER — Telehealth (HOSPITAL_COMMUNITY): Payer: Self-pay | Admitting: Internal Medicine

## 2018-06-24 ENCOUNTER — Ambulatory Visit (HOSPITAL_COMMUNITY): Payer: Medicare Other | Admitting: Physical Therapy

## 2018-06-24 DIAGNOSIS — I951 Orthostatic hypotension: Secondary | ICD-10-CM | POA: Diagnosis not present

## 2018-06-24 DIAGNOSIS — R42 Dizziness and giddiness: Secondary | ICD-10-CM | POA: Diagnosis not present

## 2018-06-24 NOTE — Telephone Encounter (Signed)
06/24/18  pt called and asked if we had any appts for today... we did and I scheduled her... she said it would save her a day

## 2018-06-24 NOTE — Therapy (Signed)
[facility] [address] [place], [place], [address] Phone: [phone]   Fax:  [phone]  Physical Therapy Treatment/Discharge   Patient Details  Name: Julie Baxter MRN: [id number] Date of Birth: 17-Oct-1935 Referring Provider (PT): Hollace Kinnier, DO   Encounter Date: 06/24/2018   PHYSICAL THERAPY DISCHARGE SUMMARY  Visits from Start of Care: 2  Current functional level related to goals / functional outcomes: same   Remaining deficits: same   Education / Equipment: HEP Plan: Patient agrees to discharge.  Patient goals were not met. Patient is being discharged due to the patient's request.  ?????Therapist explained that pt sx are most likely due to blood pressure dropping with positional changes and that she should return to her MD for further follow up.  See initial evaluation.       PT End of Session - 06/24/18 1347    Visit Number  2    Number of Visits  2    Date for PT Re-Evaluation  07/02/18    Authorization Type  [employer] Medicare (primary and secondary)    Authorization Time Period  06/18/18 to 07/02/18    PT Start Time  1300    PT Stop Time  1340    PT Time Calculation (min)  40 min    Activity Tolerance  Patient tolerated treatment well    Behavior During Therapy  [facility] for tasks assessed/performed       Past Medical History:  Diagnosis Date  . Anxiety    regarding surgery; occassionally pt. states  . Anxiety state, unspecified   . Arthritis   . Asymptomatic varicose veins   . Chronic kidney disease   . Diabetic retinopathy ([place])   . Edema   . Hard of hearing   . Hyperpotassemia   . Hypertension   . Inflammation of joint of knee    right knee  . Insomnia, unspecified   . Multiple thyroid nodules   . Osteoarthrosis, unspecified whether generalized or localized, unspecified site   . Other abnormal blood chemistry   . Other and unspecified hyperlipidemia   . Other malaise and fatigue   . Other specified  disease of nail   . Overweight(278.02)   . Pain in joint, lower leg   . Pain in joint, pelvic region and thigh   . Peripheral vascular disease ([place])    varicose veins-  "Blood Clot anterior left LEG WITH PREGNANCY"  . PONV (postoperative nausea and vomiting)    pt. states that she was sick after hip surgery and oral surgery  . Type II or unspecified type diabetes mellitus with renal manifestations, not stated as uncontrolled(250.40)   . Undiagnosed cardiac murmurs   . Unspecified disorder of kidney and ureter   . Unspecified disorder of kidney and ureter   . Urinary tract infection, site not specified     Past Surgical History:  Procedure Laterality Date  . ABDOMINAL HYSTERECTOMY    . EYE SURGERY Bilateral    cataract extraction with IOL  . LIPOMA EXCISION     x2  bilateral buttocks  . spinal meningitis  1957  . TONSILLECTOMY    . TOTAL HIP ARTHROPLASTY Right 11/29/2012   Procedure: RIGHT TOTAL HIP ARTHROPLASTY ANTERIOR APPROACH;  Surgeon: Mcarthur Rossetti, MD;  Location: [facility];  Service: Orthopedics;  Laterality: Right;  . TOTAL HIP ARTHROPLASTY Left 06/01/2015   Procedure: LEFT TOTAL HIP ARTHROPLASTY ANTERIOR APPROACH;  Surgeon: Mcarthur Rossetti, MD;  Location: [facility];  Service: Orthopedics;  Laterality: Left;    There were no vitals filed for this visit.  Subjective Assessment - 06/24/18 1301    Subjective  Julie Baxter states that she is has had a couple episodes of dizziness today.  Then states it is not dizziness per say; it is more things go dark and she feels as if she is going to pass out.     Limitations  Walking;Standing    Patient Stated Goals  get rid of this    Currently in Pain?  No/denies             Vestibular Assessment - 06/24/18 0001      Occulomotor Exam   Smooth Pursuits  Saccades   left      Dix-Hallpike Right   Dix-Hallpike Right Symptoms  No nystagmus      Dix-Hallpike Left   Dix-Hallpike Left Symptoms  No nystagmus       Horizontal Canal Right   Horizontal Canal Right Symptoms  Normal      Horizontal Canal Left   Horizontal Canal Left Symptoms  Normal                    Balance Exercises - 06/24/18 1316      Balance Exercises: Standing   Tandem Stance  Eyes open;2 reps   30" each x 2    SLS  Eyes open;3 reps   one finger hold    Sit to Stand Time  10    Other Standing Exercises  t-band hip abduction B x 10           PT Short Term Goals - 06/24/18 1356      PT SHORT TERM GOAL #1   Title  Pt will be able to perform bil SLS for 5 sec without UE support in order to demo decreased risk for falls and to improve gait.    Time  2    Period  Weeks    Status  Achieved      PT SHORT TERM GOAL #2   Title  Pt will report decreased frequency of symptoms to 2x/week or < in order to allow her to grocery shop without having to rest in order to improve community access.    Time  2    Period  Weeks    Status  Deferred        PT Long Term Goals - 06/18/18 1614      PT LONG TERM GOAL #1   Title  n/a - pt likely to be d/c within 1-2 weeks            Plan - 06/24/18 1347    Clinical Impression Statement  Initial therapist requested a second opinion on possible BPPV of pt.  This therapist did see nystagmus with saccades exercise but none with [facility] or horizontal ear canal testing.  PT has slight balance limitation but overall not bad for her age.  Therapist agrees that pt report of things going dark and feeling like she is going to pass out is more due to positional hypotension rather than BPPV.  Pt urged to return to MD for medical management.  PT given HEP and  will be discharge from skilled PT at this time.     Rehab Potential  Fair    PT Frequency  1x / week    PT Duration  2 weeks    PT Treatment/Interventions  ADLs/Self Care Home Management;Canalith Repostioning;Cryotherapy;Electrical Stimulation;Ultrasound;DME Instruction;Gait  training;Stair training;Functional mobility  training;Therapeutic activities;Therapeutic exercise;Balance training;Neuromuscular re-education;Patient/family education;Manual techniques;Energy conservation;Vestibular    PT Next Visit Plan  Discharge     PT Home Exercise Plan  none    Consulted and Agree with Plan of Care  Patient       Patient will benefit from skilled therapeutic intervention in order to improve the following deficits and impairments:  Decreased balance, Decreased activity tolerance, Dizziness, Difficulty walking, Impaired perceived functional ability  Visit Diagnosis: Orthostatic hypotension  Dizziness and giddiness     Problem List Patient Active Problem List   Diagnosis Date Noted  . Advance care planning 09/13/2017  . Orthostatic dizziness 12/16/2015  . Diabetes mellitus due to underlying condition with retinopathy ([place]) 12/16/2015  . Hypertriglyceridemia 12/16/2015  . Post-traumatic osteoarthritis of right knee 12/16/2015  . Essential hypertension 12/16/2015  . Osteoarthritis of left hip 06/01/2015  . Status post total replacement of left hip 06/01/2015  . Dizzy spells 12/09/2014  . Cystic thyroid nodule 12/09/2014  . Generalized anxiety disorder 11/25/2014  . Diabetic retinopathy ([place])   . Other and unspecified hyperlipidemia   . Degenerative arthritis of hip 11/29/2012    Rayetta Humphrey, PT CLT [phone] 06/24/2018, 1:58 PM  [facility] [place], [place], [address] Phone: [phone]   Fax:  [phone]  Name: Julie Baxter MRN: [id number] Date of Birth: 1935/09/08

## 2018-06-24 NOTE — Patient Instructions (Addendum)
Single Leg - Eyes Open    Holding support, lift right leg while maintaining balance over other leg. Progress to removing hands from support surface for longer periods of time. Hold____ seconds. Repeat ____ times per session. Do ____ sessions per day.  Copyright  VHI. All rights reserved.  Feet Heel-Toe "Tandem", Head Motion - Eyes Open    With eyes open, right foot directly in front of the other, move head slowly: up and down. Repeat 5____ times per session. Do ___2_ sessions per day.  Copyright  VHI. All rights reserved.  Movements: Eyes Only (Pictorial Reference)    Therapist: Use this card with Eye Exercises 14 through 17.   Copyright  VHI. All rights reserved.  ABDUCTION: Standing - Resistance Band (Active)    Stand, feet flat. Against green resistance band, lift right leg out to side. Complete _10__ sets of _2__ repetitions. Perform __1-2_ sessions per day.  http://gtsc.exer.us/116   Copyright  VHI. All rights reserved.  ABDUCTION: Standing - Resistance Band (Active)    Stand, feet flat. Against red resistance band, lift right leg out to side. Complete _1__ sets of _10__ repetitions. Perform ___2 sessions per day.  http://gtsc.exer.us/116   Copyright  VHI. All rights reserved.

## 2018-06-25 ENCOUNTER — Encounter (HOSPITAL_COMMUNITY): Payer: Self-pay | Admitting: Physical Therapy

## 2018-06-27 ENCOUNTER — Telehealth: Payer: Self-pay | Admitting: *Deleted

## 2018-06-27 NOTE — Telephone Encounter (Signed)
Ok to take the losartan as she is now.  She needs to drink 6-8 8oz glasses of water each day.

## 2018-06-27 NOTE — Telephone Encounter (Signed)
Patient called and wanted the results of her bloodwork. Given and agreed.  Patient stated that she had concerns with her blood pressure dropping. Stated that in therapy they told her that her dizziness is coming from her blood pressure dropping. Orthostatic Hypotension. Patient stated that sometimes the dizziness gets so bad she has to put her head down till she's herself again. Patient stated that she has been taking 1/2 tablet of the Losartan in the morning and 1 tablet in the evening and it has helped some. But patient stated that she doesn't want to do that if you don't agree. Please Advise.

## 2018-06-28 NOTE — Telephone Encounter (Signed)
Spoke with patient and advised results   

## 2018-07-02 ENCOUNTER — Encounter (HOSPITAL_COMMUNITY): Payer: Self-pay | Admitting: Physical Therapy

## 2018-07-08 ENCOUNTER — Telehealth (HOSPITAL_COMMUNITY): Payer: Self-pay

## 2018-07-08 ENCOUNTER — Telehealth: Payer: Self-pay | Admitting: *Deleted

## 2018-07-08 NOTE — Telephone Encounter (Signed)
Unable to make this change without knowing some of the blood pressure readings themselves.  It would be helpful to have orthostatics done by therapy  (if she is still getting therapy).

## 2018-07-08 NOTE — Telephone Encounter (Signed)
Patient called and stated that she is having concerns with her blood pressure. Stated that she would like her BP medication changed. Stated that Rehab told her that her dizziness could be coming from the BP medication (reaction). Stated that her blood pressure readings have been good. Patient is requesting the medication to be changed to something different. Please Advise.

## 2018-07-08 NOTE — Telephone Encounter (Signed)
Pt called requested that [facility] send a copy of her Evaulation to DO, Tiffaney Reed -pt needs to get help with her B/P and DO states they can not do anything until they get report from [facility]. NF 07/08/18

## 2018-07-08 NOTE — Telephone Encounter (Signed)
I got the readings which were orthostatic.  Reduce losartan to 25mg  po daily.  She will need to check her blood pressure one hour after her medication and let me know if she continues to have problems with dizziness.

## 2018-07-08 NOTE — Telephone Encounter (Signed)
Spoke with patient and she's taking a whole tablet daily losartan 50mg ,  and she will have therapy send over readings, also she is no longer taking therapy.

## 2018-07-08 NOTE — Telephone Encounter (Signed)
Spoke with patient and advised results She will take 1/2 of the 50 mg tablet which is 25mg .

## 2018-07-29 ENCOUNTER — Telehealth: Payer: Self-pay

## 2018-07-29 NOTE — Telephone Encounter (Signed)
Noted.  Glad she's coming in tomorrow.  Will copy Janett Billow so she's knows what it's about.  Note that patient does not like to take her medications and has had significantly high bps in the past also.  Interesting that she's still so orthostatic w/o any meds.

## 2018-07-29 NOTE — Telephone Encounter (Signed)
Patient called c/o low blood pressure  1. What are your blood pressure readings? Sitting 138/76, standing 72/44  2. When did you check your blood pressure (before or after medications)?This morning patient has not had any blood pressure  medicine since Friday 11/29  3. Any associated symptoms like dizziness, light-headed, or falls? Patient has been feeling dizzy when standing  4. Are you taking any new medications?NO  Patient scheduled an appointment to see  Sherrie Mustache NP on 07/30/2018. She noticed blood pressure starting to drop after therapy in October, Patient states she has gone 14 days without feeling dizzy.

## 2018-07-30 ENCOUNTER — Ambulatory Visit (INDEPENDENT_AMBULATORY_CARE_PROVIDER_SITE_OTHER): Payer: Medicare Other | Admitting: Nurse Practitioner

## 2018-07-30 ENCOUNTER — Encounter: Payer: Self-pay | Admitting: Nurse Practitioner

## 2018-07-30 VITALS — BP 114/70 | HR 98 | Ht 64.0 in | Wt 195.0 lb

## 2018-07-30 DIAGNOSIS — R42 Dizziness and giddiness: Secondary | ICD-10-CM

## 2018-07-30 DIAGNOSIS — I951 Orthostatic hypotension: Secondary | ICD-10-CM | POA: Diagnosis not present

## 2018-07-30 NOTE — Patient Instructions (Addendum)
To increase hydration Will get lab work today.  To wear compression hose during the day Cont to take blood pressure daily To notify if symptoms fail to improve or worsen or blood pressure worsens

## 2018-07-30 NOTE — Addendum Note (Signed)
Addended by: Logan Bores on: 07/30/2018 04:13 PM   Modules accepted: Orders

## 2018-07-30 NOTE — Progress Notes (Signed)
Careteam: Patient Care Team: Gayland Curry, DO as PCP - General (Geriatric Medicine) Hurman Horn, MD as Consulting Physician (Ophthalmology)  Advanced Directive information    Allergies  Allergen Reactions  . Naproxen Swelling and Other (See Comments)    Blacked out - Face swelling   . Sulfur Hives  . Tramadol Nausea And Vomiting    Chief Complaint  Patient presents with  . Acute Visit    Dizziness(chronic concern) off/on, progressing (last episode today). Patient d/c'ed b/p medication due to dizziness. Patient no longer taking meclizine-ineffective   . Home reading    At home today B/P sitting 120/63, pulse 77, standing 94/69, pulse 86   . Medication Management    Discuss Lasix, patient taking differently than rx'ed, only takes as needed      HPI: Patient is a 82 y.o. female seen in the office today due dizziness which she has had for 2 years.  Made appt today due to husband being concerned due to dizziness, which has become more frequent, happening daily in the last few days.  Occasionally when she is standing up she will having episodes of dizziness. Will go days and it wont happen and then all of a sudden she will get dizzy and have to put her head down and let it pass.  Only happens when she is standing up from a seated positions. Happens a few seconds after she gets up. However has had episodes where she was walking in the store, got all of her groceries then went outside to the car and had episode  Gets up slowly.  Has not taken any blood pressure medication in 5 days (losartan).  Using lasix PRN and has not needed recently Not drinking a lot of fluid No other symptoms. No shortness of breath, fatigue, chest pains, headache, blurred vision. Overall without weakness.   Review of Systems:  Review of Systems  Constitutional: Negative for chills and fever.  HENT: Negative for congestion.   Eyes: Negative for blurred vision and double vision.  Respiratory:  Negative for cough and shortness of breath.   Cardiovascular: Negative for chest pain, palpitations and leg swelling.  Gastrointestinal: Negative for abdominal pain, constipation, diarrhea, heartburn, nausea and vomiting.  Genitourinary: Negative for frequency.  Neurological: Positive for dizziness. Negative for tingling, tremors, sensory change, weakness and headaches.    Past Medical History:  Diagnosis Date  . Anxiety    regarding surgery; occassionally pt. states  . Anxiety state, unspecified   . Arthritis   . Asymptomatic varicose veins   . Chronic kidney disease   . Diabetic retinopathy ([place])   . Edema   . Hard of hearing   . Hyperpotassemia   . Hypertension   . Inflammation of joint of knee    right knee  . Insomnia, unspecified   . Multiple thyroid nodules   . Osteoarthrosis, unspecified whether generalized or localized, unspecified site   . Other abnormal blood chemistry   . Other and unspecified hyperlipidemia   . Other malaise and fatigue   . Other specified disease of nail   . Overweight(278.02)   . Pain in joint, lower leg   . Pain in joint, pelvic region and thigh   . Peripheral vascular disease ([place])    varicose veins-  "Blood Clot anterior left LEG WITH PREGNANCY"  . PONV (postoperative nausea and vomiting)    pt. states that she was sick after hip surgery and oral surgery  . Type II or unspecified  type diabetes mellitus with renal manifestations, not stated as uncontrolled(250.40)   . Undiagnosed cardiac murmurs   . Unspecified disorder of kidney and ureter   . Unspecified disorder of kidney and ureter   . Urinary tract infection, site not specified    Past Surgical History:  Procedure Laterality Date  . ABDOMINAL HYSTERECTOMY    . EYE SURGERY Bilateral    cataract extraction with IOL  . LIPOMA EXCISION     x2  bilateral buttocks  . spinal meningitis  1957  . TONSILLECTOMY    . TOTAL HIP ARTHROPLASTY Right 11/29/2012   Procedure: RIGHT TOTAL HIP  ARTHROPLASTY ANTERIOR APPROACH;  Surgeon: Mcarthur Rossetti, MD;  Location: [facility];  Service: Orthopedics;  Laterality: Right;  . TOTAL HIP ARTHROPLASTY Left 06/01/2015   Procedure: LEFT TOTAL HIP ARTHROPLASTY ANTERIOR APPROACH;  Surgeon: Mcarthur Rossetti, MD;  Location: [facility];  Service: Orthopedics;  Laterality: Left;   Social History:   reports that she has never smoked. She has never used smokeless tobacco. She reports that she does not drink alcohol or use drugs.  History reviewed. No pertinent family history.  Medications: Patient's Medications  New Prescriptions   No medications on file  Previous Medications   ACCU-CHEK FASTCLIX LANCETS MISC    Test blood sugar twice daily DX E08.319   ASPIRIN EC 81 MG TABLET    Take 81 mg by mouth daily.   DICLOFENAC SODIUM (VOLTAREN) 1 % GEL    APPLY TOPICALLY 2 TO 4  GRAMS UP TO 4 TIMES DAILY   FUROSEMIDE (LASIX) 20 MG TABLET    TAKE 1 TABLET BY MOUTH  DAILY FOR BLOOD PRESSURE  AND EDEMA   GLIPIZIDE (GLUCOTROL) 5 MG TABLET    TAKE 1 TABLET BY MOUTH  TWICE A DAY BEFORE MEALS  (BREAKFAST AND LUNCH)   GLUCOSE BLOOD (ACCU-CHEK SMARTVIEW) TEST STRIP    Use to check blood sugar twice daily. Dx: E11.319   METFORMIN (GLUCOPHAGE) 500 MG TABLET    TAKE 2 TABLETS BY MOUTH TWO TIMES DAILY WITH MEALS   PROPYLENE GLYCOL (SYSTANE BALANCE OP)    Apply 1 drop to eye daily as needed (dry eyes).   SIMVASTATIN (ZOCOR) 40 MG TABLET    TAKE 1 TABLET BY MOUTH  DAILY  Modified Medications   No medications on file  Discontinued Medications   ACETAMINOPHEN (TYLENOL) 500 MG TABLET    Take 1,000 mg by mouth every 8 (eight) hours as needed for mild pain or moderate pain.   LOSARTAN (COZAAR) 50 MG TABLET    Take 25 mg by mouth every evening.    MECLIZINE (ANTIVERT) 25 MG TABLET    Take 1 tablet (25 mg total) by mouth 3 (three) times daily as needed for dizziness.     Physical Exam:  Vitals:   07/30/18 1524 07/30/18 1531 07/30/18 1536 07/30/18 1537  BP:  124/80 140/86 124/80 114/70  Pulse: 82 73 82 98  SpO2: 97% 96% 97% 98%  Weight: 195 lb (88.5 kg)     Height: 5\' 4"  (1.626 m)      Body mass index is 33.47 kg/m.  Physical Exam  Constitutional: She is oriented to person, place, and time. She appears well-developed and well-nourished. No distress.  HENT:  Head: Normocephalic and atraumatic.  Right Ear: External ear normal.  Left Ear: External ear normal.  Nose: Nose normal.  Mouth/Throat: Oropharynx is clear and moist. No oropharyngeal exudate.  Eyes: Pupils are equal, round, and reactive to light.  Conjunctivae and EOM are normal.  Neck: Normal range of motion. Neck supple.  Cardiovascular: Normal rate, regular rhythm and normal heart sounds.  Pulmonary/Chest: Effort normal and breath sounds normal. No respiratory distress.  Abdominal: Soft. Bowel sounds are normal.  Neurological: She is alert and oriented to person, place, and time. She has normal strength. She displays a negative Romberg sign.  Skin: Skin is warm and dry. Capillary refill takes less than 2 seconds.  Psychiatric: She has a normal mood and affect.    Labs reviewed: Basic Metabolic Panel: Recent Labs    09/13/17 1048 12/28/17 1136 06/13/18 1040  NA 140 139 140  K 4.9 5.0 5.2  CL 104 105 103  CO2 27 24 27   GLUCOSE 88 161* 131*  BUN 24 28* 18  CREATININE 1.62* 1.57* 1.54*  CALCIUM 9.5 9.2 9.4  TSH  --  0.87 1.07   Liver Function Tests: Recent Labs    09/13/17 1048 12/28/17 1136 06/13/18 1040  AST 13 15 15   ALT 13 11 12   BILITOT 0.5 0.5 0.5  PROT 7.0 6.5 6.7   No results for input(s): LIPASE, AMYLASE in the last 8760 hours. No results for input(s): AMMONIA in the last 8760 hours. CBC: Recent Labs    09/21/17 0855 12/28/17 1136 06/13/18 1040  WBC 9.0 10.1 10.4  NEUTROABS 4,662 5,727 6,406  HGB 11.5* 11.3* 11.9  HCT 33.9* 33.8* 34.4*  MCV 89.0 89.4 88.9  PLT 281 285 274   Lipid Panel:  Recent Labs    09/13/17 1048 06/13/18 1040    CHOL 141 137  HDL 49* 51  LDLCALC 69 64  TRIG 143 140  CHOLHDL 2.9 2.7   TSH: Recent Labs    12/28/17 1136 06/13/18 1040  TSH 0.87 1.07   A1C: Lab Results  Component Value Date   HGBA1C 7.2 (H) 06/13/2018     Assessment/Plan  1. Orthostatic hypotension 2. Dizziness -pt with chronic dizziness which has gotten progressively worse over the last few days. She has been in PT for this where they discovered orthostatic hypotension. She has been off all blood pressure medication (lasix and losartan) for 5 days. She is mildly orthostatic in office today. Encouraged proper diet and to increase her fluid intake (does not drink a lot of fluid) -to use compression hose -educated to get up slowly and stand before walking.  -recent TSH normal.  - COMPLETE METABOLIC PANEL WITH GFR - CBC with Differential/Platelets  Next appt: 12/12/2018, sooner if symptoms fail to improve or worsen Jessica K. [place], [facility] [phone]

## 2018-07-31 LAB — CBC WITH DIFFERENTIAL/PLATELET
Basophils Absolute: 36 cells/uL (ref 0–200)
Basophils Relative: 0.4 %
EOS ABS: 319 {cells}/uL (ref 15–500)
Eosinophils Relative: 3.5 %
HCT: 34.4 % — ABNORMAL LOW (ref 35.0–45.0)
HEMOGLOBIN: 11.6 g/dL — AB (ref 11.7–15.5)
Lymphs Abs: 2848 cells/uL (ref 850–3900)
MCH: 30.7 pg (ref 27.0–33.0)
MCHC: 33.7 g/dL (ref 32.0–36.0)
MCV: 91 fL (ref 80.0–100.0)
MPV: 10.6 fL (ref 7.5–12.5)
Monocytes Relative: 11.3 %
Neutro Abs: 4869 cells/uL (ref 1500–7800)
Neutrophils Relative %: 53.5 %
Platelets: 291 10*3/uL (ref 140–400)
RBC: 3.78 10*6/uL — ABNORMAL LOW (ref 3.80–5.10)
RDW: 12.3 % (ref 11.0–15.0)
Total Lymphocyte: 31.3 %
WBC: 9.1 10*3/uL (ref 3.8–10.8)
WBCMIX: 1028 {cells}/uL — AB (ref 200–950)

## 2018-07-31 LAB — COMPLETE METABOLIC PANEL WITH GFR
AG Ratio: 1.6 (calc) (ref 1.0–2.5)
ALBUMIN MSPROF: 4 g/dL (ref 3.6–5.1)
ALKALINE PHOSPHATASE (APISO): 60 U/L (ref 33–130)
ALT: 12 U/L (ref 6–29)
AST: 18 U/L (ref 10–35)
BILIRUBIN TOTAL: 0.3 mg/dL (ref 0.2–1.2)
BUN/Creatinine Ratio: 13 (calc) (ref 6–22)
BUN: 20 mg/dL (ref 7–25)
CHLORIDE: 102 mmol/L (ref 98–110)
CO2: 26 mmol/L (ref 20–32)
Calcium: 9.7 mg/dL (ref 8.6–10.4)
Creat: 1.57 mg/dL — ABNORMAL HIGH (ref 0.60–0.88)
GFR, Est African American: 36 mL/min/{1.73_m2} — ABNORMAL LOW (ref >=60)
GFR, Est Non African American: 31 mL/min/{1.73_m2} — ABNORMAL LOW (ref >=60)
GLUCOSE: 215 mg/dL — AB (ref 65–139)
Globulin: 2.5 g/dL (calc) (ref 1.9–3.7)
POTASSIUM: 5.1 mmol/L (ref 3.5–5.3)
SODIUM: 138 mmol/L (ref 135–146)
Total Protein: 6.5 g/dL (ref 6.1–8.1)

## 2018-09-03 ENCOUNTER — Other Ambulatory Visit: Payer: Self-pay | Admitting: Internal Medicine

## 2018-11-04 ENCOUNTER — Other Ambulatory Visit: Payer: Self-pay | Admitting: Internal Medicine

## 2018-12-12 ENCOUNTER — Other Ambulatory Visit: Payer: Self-pay

## 2018-12-12 ENCOUNTER — Ambulatory Visit (INDEPENDENT_AMBULATORY_CARE_PROVIDER_SITE_OTHER): Payer: Medicare Other | Admitting: Internal Medicine

## 2018-12-12 ENCOUNTER — Encounter: Payer: Self-pay | Admitting: Internal Medicine

## 2018-12-12 DIAGNOSIS — M1731 Unilateral post-traumatic osteoarthritis, right knee: Secondary | ICD-10-CM | POA: Diagnosis not present

## 2018-12-12 DIAGNOSIS — I10 Essential (primary) hypertension: Secondary | ICD-10-CM

## 2018-12-12 DIAGNOSIS — Z7189 Other specified counseling: Secondary | ICD-10-CM | POA: Diagnosis not present

## 2018-12-12 DIAGNOSIS — E08319 Diabetes mellitus due to underlying condition with unspecified diabetic retinopathy without macular edema: Secondary | ICD-10-CM

## 2018-12-12 DIAGNOSIS — F411 Generalized anxiety disorder: Secondary | ICD-10-CM

## 2018-12-12 DIAGNOSIS — I951 Orthostatic hypotension: Secondary | ICD-10-CM | POA: Insufficient documentation

## 2018-12-12 NOTE — Progress Notes (Signed)
Patient ID: Julie Baxter, female   DOB: February 04, 1936, 83 y.o.   MRN: [id number] This service is provided via telemedicine  No vital signs collected/recorded due to the encounter was a telemedicine visit.   Location of patient (ex: home, work):  HOME  Patient consents to a telephone visit:  YES  Location of the provider (ex: office, home):  OFFICE  Name of any referring provider:  Dr. Hollace Kinnier, DO  Names of all persons participating in the telemedicine service and their role in the encounter:  Patient, Julie Baxter, CMA, Dr. Hollace Kinnier, DO  Time spent on call:  4:04    Provider:  Oluwatimilehin Balfour L. Mariea Clonts, D.O., C.M.D.  Code Status: DNR Goals of Care:  Advanced Directives 06/18/2018  Does Patient Have a Medical Advance Directive? Yes  Type of Advance Directive Out of facility DNR (pink MOST or yellow form)  Does patient want to make changes to medical advance directive? -  Copy of [place] in Chart? -  Would patient like information on creating a medical advance directive? -  Pre-existing out of facility DNR order (yellow form or pink MOST form) Yellow form placed in chart (order not valid for inpatient use)     Chief Complaint  Patient presents with  . TELEPHONE VISIT    6MTH FOLLOW-UP    HPI: Patient is a 83 y.o. female seen today for medical management of chronic diseases.    She is being good and staying in.  Her husband had a heart attack in sept.  It's their anniversary today.  They are having strawberry shortcake for their dessert this evening.  Her kids are staying away.  BP results:  She had a period where it was really low to where she had to ask them to put her groceries in her car and return her cart.  It was going on 6-8 weeks.  She did better about drinking more water and eating better.  She drank less water yesterday and had less energy this morning.  She says it's ok when she doesn't miss that water.  Fluid pill makes it drop.  117/71  90 141/71 80s-91 111/60 120/66 108/69 118/68 97/57 at 5pm 156/85 this am, but 105/65 at 11am Does occasionally eat a handful of chips or something that will bring it up.  Not having orthostatic hypotension or dizziness as long as she drinks her water.  Her knee pain is not as bad.  Not needing much gel--swells primarily if she's up on it a lot.  Her husband is struggling with his b/c he can't stay off of it.   She is less worried about her husband now and his skipped heart beats.  She thinks she is at peace with it.  She feels like the covid-19 is out of her hands just as his heart problems are.  Of course, she does not want to lose him.    She does not want to put her children through dealing with her on a machine.  She does not want her life prolonged on a ventilator if she were to get a virus.   She says she still wants to hug her children.  She is sanitizing and handwashing and wearing a scarf over her face when she gets groceries.  Based on her sugars--double digits in the mornings.  Weights are within 1-2 lbs.  196 lbs give or take.    Past Medical History:  Diagnosis Date  . Anxiety    regarding  surgery; occassionally pt. states  . Anxiety state, unspecified   . Arthritis   . Asymptomatic varicose veins   . Chronic kidney disease   . Diabetic retinopathy ([place])   . Edema   . Hard of hearing   . Hyperpotassemia   . Hypertension   . Inflammation of joint of knee    right knee  . Insomnia, unspecified   . Multiple thyroid nodules   . Osteoarthrosis, unspecified whether generalized or localized, unspecified site   . Other abnormal blood chemistry   . Other and unspecified hyperlipidemia   . Other malaise and fatigue   . Other specified disease of nail   . Overweight(278.02)   . Pain in joint, lower leg   . Pain in joint, pelvic region and thigh   . Peripheral vascular disease ([place])    varicose veins-  "Blood Clot anterior left LEG WITH PREGNANCY"  . PONV (postoperative  nausea and vomiting)    pt. states that she was sick after hip surgery and oral surgery  . Type II or unspecified type diabetes mellitus with renal manifestations, not stated as uncontrolled(250.40)   . Undiagnosed cardiac murmurs   . Unspecified disorder of kidney and ureter   . Unspecified disorder of kidney and ureter   . Urinary tract infection, site not specified     Past Surgical History:  Procedure Laterality Date  . ABDOMINAL HYSTERECTOMY    . EYE SURGERY Bilateral    cataract extraction with IOL  . LIPOMA EXCISION     x2  bilateral buttocks  . spinal meningitis  1957  . TONSILLECTOMY    . TOTAL HIP ARTHROPLASTY Right 11/29/2012   Procedure: RIGHT TOTAL HIP ARTHROPLASTY ANTERIOR APPROACH;  Surgeon: Mcarthur Rossetti, MD;  Location: [facility];  Service: Orthopedics;  Laterality: Right;  . TOTAL HIP ARTHROPLASTY Left 06/01/2015   Procedure: LEFT TOTAL HIP ARTHROPLASTY ANTERIOR APPROACH;  Surgeon: Mcarthur Rossetti, MD;  Location: [place];  Service: Orthopedics;  Laterality: Left;    Allergies  Allergen Reactions  . Naproxen Swelling and Other (See Comments)    Blacked out - Face swelling   . Sulfur Hives  . Tramadol Nausea And Vomiting    Outpatient Encounter Medications as of 12/12/2018  Medication Sig  . ACCU-CHEK FASTCLIX LANCETS MISC Test blood sugar twice daily DX E08.319  . aspirin EC 81 MG tablet Take 81 mg by mouth daily.  . diclofenac sodium (VOLTAREN) 1 % GEL APPLY TOPICALLY 2 TO 4  GRAMS UP TO 4 TIMES DAILY  . furosemide (LASIX) 20 MG tablet TAKE 1 TABLET BY MOUTH  DAILY FOR BLOOD PRESSURE  AND EDEMA  . glipiZIDE (GLUCOTROL) 5 MG tablet TAKE 1 TABLET BY MOUTH  TWICE A DAY BEFORE MEALS  (BREAKFAST AND LUNCH)  . glucose blood test strip USE TO CHECK BLOOD SUGAR  TWICE DAILY.  . metFORMIN (GLUCOPHAGE) 500 MG tablet TAKE 2 TABLETS BY MOUTH TWO TIMES DAILY WITH MEALS  . Propylene Glycol (SYSTANE BALANCE OP) Apply 1 drop to eye daily as needed (dry eyes).  .  simvastatin (ZOCOR) 40 MG tablet TAKE 1 TABLET BY MOUTH  DAILY   No facility-administered encounter medications on file as of 12/12/2018.     Review of Systems:  Review of Systems  Constitutional: Negative for chills, fever and malaise/fatigue.       Weight stable   HENT: Positive for hearing loss.   Eyes: Negative for blurred vision.  Respiratory: Negative for cough and shortness of breath.  Cardiovascular: Negative for chest pain, palpitations and leg swelling.  Gastrointestinal: Negative for abdominal pain, blood in stool, constipation, diarrhea and melena.  Genitourinary: Negative for dysuria.  Musculoskeletal: Positive for joint pain. Negative for falls.       Knee is not bad now  Skin: Negative for itching and rash.  Neurological: Negative for dizziness and loss of consciousness.  Endo/Heme/Allergies: Does not bruise/bleed easily.  Psychiatric/Behavioral: Negative for depression and memory loss. The patient is nervous/anxious. The patient does not have insomnia.     Health Maintenance  Topic Date Due  . FOOT EXAM  02/23/2018  . OPHTHALMOLOGY EXAM  10/23/2018  . DEXA SCAN  08/29/2023 (Originally 04/12/2003)  . HEMOGLOBIN A1C  12/13/2018  . INFLUENZA VACCINE  03/29/2019  . PNA vac Low Risk Adult  Completed    Physical Exam: Could not be performed as visit non face-to-face via phone  Labs reviewed: Basic Metabolic Panel: Recent Labs    12/28/17 1136 06/13/18 1040 07/30/18 1559  NA 139 140 138  K 5.0 5.2 5.1  CL 105 103 102  CO2 24 27 26   GLUCOSE 161* 131* 215*  BUN 28* 18 20  CREATININE 1.57* 1.54* 1.57*  CALCIUM 9.2 9.4 9.7  TSH 0.87 1.07  --    Liver Function Tests: Recent Labs    12/28/17 1136 06/13/18 1040 07/30/18 1559  AST 15 15 18   ALT 11 12 12   BILITOT 0.5 0.5 0.3  PROT 6.5 6.7 6.5   No results for input(s): LIPASE, AMYLASE in the last 8760 hours. No results for input(s): AMMONIA in the last 8760 hours. CBC: Recent Labs    12/28/17 1136  06/13/18 1040 07/30/18 1559  WBC 10.1 10.4 9.1  NEUTROABS 5,727 6,406 4,869  HGB 11.3* 11.9 11.6*  HCT 33.8* 34.4* 34.4*  MCV 89.4 88.9 91.0  PLT 285 274 291   Lipid Panel: Recent Labs    06/13/18 1040  CHOL 137  HDL 51  LDLCALC 64  TRIG 140  CHOLHDL 2.7   Lab Results  Component Value Date   HGBA1C 7.2 (H) 06/13/2018    Assessment/Plan 1. Diabetes mellitus due to underlying condition with retinopathy of both eyes, without long-term current use of insulin, macular edema presence unspecified, unspecified retinopathy severity ([place]) -pt reports that sugars have been good fasting lately and she's eating better -she's never been one to outright exercise -does stay active -cont glipizide, metformin, asa, simvastatin  2. Essential hypertension -bp is controlled based on readings at home she provided with rare exception -had been getting orthostatic hypotension when she was not hydrating well  3. Post-traumatic osteoarthritis of right knee -not too bad here lately -cont voltaren gel as needed  4. Orthostatic hypotension -encouraged hydration--she's doing better with this that she used to and can always tell if she's not done well that day  5. Generalized anxiety disorder -improved--seems to be coping with her husband's illness better lately  6.  Advance care planning - Last ACP Note 09/13/2018 to 12/12/2018       ACP (Advance Care Planning) by Gayland Curry, DO at 12/12/2018  8:30 AM    Date of Service   Author Author Type Status Note Type File Time  12/12/2018 Addend Gayland Curry, DO Physician Signed ACP (Advance Care Planning) 12/12/2018             Tashawnda and I reviewed again about her wishes for end-of-life.  She is a very religious person and does not want her family  to be stuck making difficult decisions.  She would not want to be put on a ventilator if she got very ill with covid-19.  She overall would not want to be put on machines that prolong her life.   17  minutes spent on discussed this.  We talked about her husband's health and her religious beliefs as well as her family members in health care.                Labs/tests ordered:  No orders of the defined types were placed in this encounter.   Next appt:  6 mos med mgt, fasting labs before Non face-to-face time spent on televisit:  23 mins   Haizley Cannella L. Amour Trigg, D.O. [facility] [address], [place] [address] Cell Phone (Mon-Fri 8am-5pm):  [phone] On Call:  [phone] & follow prompts after 5pm & weekends Office Phone:  [phone] Office Fax:  [phone]

## 2018-12-12 NOTE — ACP (Advance Care Planning) (Signed)
Nikiah and I reviewed again about her wishes for end-of-life.  She is a very religious person and does not want her family to be stuck making difficult decisions.  She would not want to be put on a ventilator if she got very ill with covid-19.  She overall would not want to be put on machines that prolong her life.   17 minutes spent on discussed this.  We talked about her husband's health and her religious beliefs as well as her family members in health care.

## 2019-01-03 ENCOUNTER — Telehealth: Payer: Self-pay | Admitting: *Deleted

## 2019-01-03 NOTE — Telephone Encounter (Signed)
Spoke with patient and advised results   

## 2019-01-03 NOTE — Telephone Encounter (Signed)
Patient called and stated that her nails are peeling in layers. Stated that she keeps them filed down. Taking MVT and Biotene. Wonders if it is a deficiency. Has tried OTC Nail care and didn't work. Please Advise.

## 2019-01-03 NOTE — Telephone Encounter (Signed)
I recommend she increase her intake of iron-containing foods like spinach, other leafy greens (not so much liver and beef though due to the cholesterol concerns with those).  She also could try a biotin supplement also but they are not really proven to improve brittle nails like they are touted.

## 2019-01-14 ENCOUNTER — Other Ambulatory Visit: Payer: Self-pay | Admitting: Internal Medicine

## 2019-03-12 ENCOUNTER — Other Ambulatory Visit: Payer: Self-pay

## 2019-03-12 NOTE — Patient Outreach (Signed)
[facility]) Care Management  03/12/2019  Julie Baxter Manger Feb 20, 1936 [id number]   Medication Adherence call to Julie Baxter [facility] Verify spoke with patient she is past due on Simvastatin 40 mg,Metformin 500 mg and Glipizide 5 mg patient explain she is still taking all three medications and ask if we can call [facility] an order this medication [facility] will mail out with in 5-7 days. Julie Baxter is showing past due under [place].  [place] Management Direct Dial [phone]  Fax [phone] [email or web address]

## 2019-04-09 ENCOUNTER — Other Ambulatory Visit: Payer: Self-pay | Admitting: Internal Medicine

## 2019-04-30 ENCOUNTER — Other Ambulatory Visit: Payer: Self-pay | Admitting: Internal Medicine

## 2019-06-12 ENCOUNTER — Other Ambulatory Visit: Payer: Self-pay | Admitting: Internal Medicine

## 2019-06-12 ENCOUNTER — Other Ambulatory Visit: Payer: Self-pay

## 2019-06-16 ENCOUNTER — Encounter: Payer: Medicare Other | Admitting: Family

## 2019-06-16 ENCOUNTER — Ambulatory Visit: Payer: Self-pay | Admitting: Internal Medicine

## 2019-06-19 ENCOUNTER — Other Ambulatory Visit: Payer: Self-pay

## 2019-06-19 ENCOUNTER — Encounter: Payer: Self-pay | Admitting: Internal Medicine

## 2019-06-19 ENCOUNTER — Ambulatory Visit (INDEPENDENT_AMBULATORY_CARE_PROVIDER_SITE_OTHER): Payer: Medicare Other | Admitting: Internal Medicine

## 2019-06-19 VITALS — BP 160/98 | HR 84 | Temp 98.0°F | Ht 64.0 in | Wt 199.4 lb

## 2019-06-19 DIAGNOSIS — Z23 Encounter for immunization: Secondary | ICD-10-CM | POA: Diagnosis not present

## 2019-06-19 DIAGNOSIS — F411 Generalized anxiety disorder: Secondary | ICD-10-CM

## 2019-06-19 DIAGNOSIS — I10 Essential (primary) hypertension: Secondary | ICD-10-CM

## 2019-06-19 DIAGNOSIS — K219 Gastro-esophageal reflux disease without esophagitis: Secondary | ICD-10-CM

## 2019-06-19 DIAGNOSIS — E08319 Diabetes mellitus due to underlying condition with unspecified diabetic retinopathy without macular edema: Secondary | ICD-10-CM | POA: Diagnosis not present

## 2019-06-19 DIAGNOSIS — D649 Anemia, unspecified: Secondary | ICD-10-CM | POA: Diagnosis not present

## 2019-06-19 DIAGNOSIS — E781 Pure hyperglyceridemia: Secondary | ICD-10-CM

## 2019-06-19 DIAGNOSIS — I951 Orthostatic hypotension: Secondary | ICD-10-CM | POA: Diagnosis not present

## 2019-06-19 MED ORDER — OMEPRAZOLE MAGNESIUM 20 MG PO TBEC: 20.0000 mg | DELAYED_RELEASE_TABLET | Freq: Every day | ORAL | 0 refills | Status: DC

## 2019-06-19 NOTE — Progress Notes (Signed)
Location:  [facility] clinic Provider:  Sabrin Dunlevy L. Mariea Clonts, D.O., C.M.D.  Goals of Care:  Advanced Directives 06/18/2018  Does Patient Have a Medical Advance Directive? Yes  Type of Advance Directive Out of facility DNR (pink MOST or yellow form)  Does patient want to make changes to medical advance directive? -  Copy of [place] in Chart? -  Would patient like information on creating a medical advance directive? -  Pre-existing out of facility DNR order (yellow form or pink MOST form) Yellow form placed in chart (order not valid for inpatient use)     Chief Complaint  Patient presents with  . Medical Management of Chronic Issues    6 Month Follow up. Wants to discuss taking flu shot    HPI: Patient is a 83 y.o. female seen today for medical management of chronic diseases.    Did not do labs ahead--will get today.  BP high on arrival.   Has low bp at home.  Lowest was 71/46.  Tries to drink a lot of water and knows she does not always get that done.  Will check before she goes to the grocery store to make sure she's ok.  She gets dizzy when it's low.  Happened when shopping and she had to sit down.  By the time she got home, it was that 71/46.    Weight is up 3 lbs since last time.    She uses a little salt with certain foods--cooked mushrooms and put it on there.  Has not taken lasix 1/2 tablet but once when she had her bp up.  Swelling usually goes away just with less sodium.    She uses a wrist cuff at home, but has used a regular one and still had low bp.  Says it's higher in the morning and goes down when she gets active.  Sits on her stool to do dishes.  Does not really feel dizzy, but feels she must sit.  She's had days where she has to put her head down. She had been wearing the compression hose and says they did help her.    Has some heartburn and reflux.  Asks what to take.  Used to use tums but they quit working.  BP readings:  128/56, 151/84, 83/46 on  10/1 129/50, 156/80, 134/80 on 10/15 136/80, 112/65, 134/78 on 10/20  Agrees to take flu shot.  Asks about bringing 55 people through her home at Christmas.  I advised against this due to covid risks for her and her husband who has more illnesses than she does.  Asks about recall of metformin--does not include hers. Past Medical History:  Diagnosis Date  . Anxiety    regarding surgery; occassionally pt. states  . Anxiety state, unspecified   . Arthritis   . Asymptomatic varicose veins   . Chronic kidney disease   . Diabetic retinopathy ([place])   . Edema   . Hard of hearing   . Hyperpotassemia   . Hypertension   . Inflammation of joint of knee    right knee  . Insomnia, unspecified   . Multiple thyroid nodules   . Osteoarthrosis, unspecified whether generalized or localized, unspecified site   . Other abnormal blood chemistry   . Other and unspecified hyperlipidemia   . Other malaise and fatigue   . Other specified disease of nail   . Overweight(278.02)   . Pain in joint, lower leg   . Pain in joint, pelvic region  and thigh   . Peripheral vascular disease ([place])    varicose veins-  "Blood Clot anterior left LEG WITH PREGNANCY"  . PONV (postoperative nausea and vomiting)    pt. states that she was sick after hip surgery and oral surgery  . Type II or unspecified type diabetes mellitus with renal manifestations, not stated as uncontrolled(250.40)   . Undiagnosed cardiac murmurs   . Unspecified disorder of kidney and ureter   . Unspecified disorder of kidney and ureter   . Urinary tract infection, site not specified     Past Surgical History:  Procedure Laterality Date  . ABDOMINAL HYSTERECTOMY    . EYE SURGERY Bilateral    cataract extraction with IOL  . LIPOMA EXCISION     x2  bilateral buttocks  . spinal meningitis  1957  . TONSILLECTOMY    . TOTAL HIP ARTHROPLASTY Right 11/29/2012   Procedure: RIGHT TOTAL HIP ARTHROPLASTY ANTERIOR APPROACH;  Surgeon: Mcarthur Rossetti, MD;  Location: [facility];  Service: Orthopedics;  Laterality: Right;  . TOTAL HIP ARTHROPLASTY Left 06/01/2015   Procedure: LEFT TOTAL HIP ARTHROPLASTY ANTERIOR APPROACH;  Surgeon: Mcarthur Rossetti, MD;  Location: [place];  Service: Orthopedics;  Laterality: Left;    Allergies  Allergen Reactions  . Naproxen Swelling and Other (See Comments)    Blacked out - Face swelling   . Sulfur Hives  . Tramadol Nausea And Vomiting    Outpatient Encounter Medications as of 06/19/2019  Medication Sig  . ACCU-CHEK FASTCLIX LANCETS MISC Test blood sugar twice daily DX E08.319  . ACCU-CHEK SMARTVIEW test strip USE TO CHECK BLOOD SUGAR  TWO TIMES DAILY  . aspirin EC 81 MG tablet Take 81 mg by mouth daily.  . diclofenac sodium (VOLTAREN) 1 % GEL APPLY TOPICALLY 2 TO 4  GRAMS UP TO 4 TIMES DAILY  . furosemide (LASIX) 20 MG tablet TAKE 1 TABLET BY MOUTH  DAILY FOR BLOOD PRESSURE  AND EDEMA (Patient taking differently: Take 20 mg by mouth daily as needed. )  . glipiZIDE (GLUCOTROL) 5 MG tablet TAKE 1 TABLET BY MOUTH   TWICE A DAY BEFORE MEALS   (BREAKFAST AND LUNCH)  . metFORMIN (GLUCOPHAGE) 500 MG tablet TAKE 2 TABLETS BY MOUTH TWO TIMES DAILY WITH MEALS  . Propylene Glycol (SYSTANE BALANCE OP) Apply 1 drop to eye daily as needed (dry eyes).  . simvastatin (ZOCOR) 40 MG tablet TAKE 1 TABLET BY MOUTH  DAILY   No facility-administered encounter medications on file as of 06/19/2019.     Review of Systems:  Review of Systems  Constitutional: Negative for chills, fever and malaise/fatigue.  HENT: Positive for hearing loss. Negative for congestion.   Eyes: Negative for blurred vision.  Respiratory: Negative for cough and shortness of breath.   Cardiovascular: Negative for chest pain, palpitations and leg swelling.  Gastrointestinal: Negative for abdominal pain, blood in stool, constipation, diarrhea and melena.  Genitourinary: Negative for dysuria.  Musculoskeletal: Negative for falls.  Skin:  Negative for rash.  Neurological: Positive for dizziness. Negative for loss of consciousness.  Endo/Heme/Allergies: Does not bruise/bleed easily.  Psychiatric/Behavioral: Negative for depression and memory loss. The patient is nervous/anxious. The patient does not have insomnia.     Health Maintenance  Topic Date Due  . FOOT EXAM  02/23/2018  . OPHTHALMOLOGY EXAM  10/23/2018  . HEMOGLOBIN A1C  12/13/2018  . INFLUENZA VACCINE  03/29/2019  . DEXA SCAN  08/29/2023 (Originally 04/12/2003)  . PNA vac Low Risk Adult  Completed    Physical Exam: Vitals:   06/19/19 0857  BP: (!) 160/98  Pulse: 84  Temp: 98 F (36.7 C)  TempSrc: Oral  SpO2: 98%  Weight: 199 lb 6.4 oz (90.4 kg)  Height: 5\' 4"  (1.626 m)   Body mass index is 34.23 kg/m. Physical Exam Vitals signs reviewed.  Constitutional:      Appearance: Normal appearance.  HENT:     Head: Normocephalic and atraumatic.  Cardiovascular:     Rate and Rhythm: Normal rate and regular rhythm.     Pulses: Normal pulses.     Heart sounds: Normal heart sounds.  Pulmonary:     Effort: Pulmonary effort is normal.     Breath sounds: Normal breath sounds. No wheezing, rhonchi or rales.  Abdominal:     General: Bowel sounds are normal.  Musculoskeletal: Normal range of motion.     Right lower leg: No edema.     Left lower leg: No edema.  Skin:    General: Skin is warm and dry.  Neurological:     General: No focal deficit present.     Mental Status: She is alert and oriented to person, place, and time.     Cranial Nerves: No cranial nerve deficit.     Motor: No weakness.     Gait: Gait normal.  Psychiatric:        Mood and Affect: Mood normal.     Labs reviewed: Basic Metabolic Panel: Recent Labs    07/30/18 1559  NA 138  K 5.1  CL 102  CO2 26  GLUCOSE 215*  BUN 20  CREATININE 1.57*  CALCIUM 9.7   Liver Function Tests: Recent Labs    07/30/18 1559  AST 18  ALT 12  BILITOT 0.3  PROT 6.5   No results for  input(s): LIPASE, AMYLASE in the last 8760 hours. No results for input(s): AMMONIA in the last 8760 hours. CBC: Recent Labs    07/30/18 1559  WBC 9.1  NEUTROABS 4,869  HGB 11.6*  HCT 34.4*  MCV 91.0  PLT 291   Lipid Panel: No results for input(s): CHOL, HDL, LDLCALC, TRIG, CHOLHDL, LDLDIRECT in the last 8760 hours. Lab Results  Component Value Date   HGBA1C 7.2 (H) 06/13/2018    Assessment/Plan 1. Diabetes mellitus due to underlying condition with retinopathy of both eyes, without long-term current use of insulin, macular edema presence unspecified, unspecified retinopathy severity ([place]) - control level not known at this point--last check was a year ago due to covid -foot exam done today and unremarkable - Hemoglobin A1c - Lipid panel - COMPLETE METABOLIC PANEL WITH GFR - CBC with Differential/Platelet  2. Essential hypertension -bps tend to be elevated in am and low in pm so not safe to give medications for bp -is to be wearing compression hose to help with orthostatic hypotension - COMPLETE METABOLIC PANEL WITH GFR  3. Orthostatic hypotension - has had ongoing -hydrate, slow positional changes and COMPRESSION HOSE must be worn to prevent near syncope when up and about - COMPLETE METABOLIC PANEL WITH GFR  4. Generalized anxiety disorder -continues with her usual pressured speech and worries about her husband and other things in life, has always refused therapy and meds  5. Hypertriglyceridemia -cont simvastatin therapy -await lab  6. Anemia, unspecified type - f/u cbc and iron panel--had been taking iron at one time but is not at present - Iron, TIBC and Ferritin Panel  7. Need for influenza vaccination - Flu Vaccine  QUAD High Dose(Fluad) given today  8.  GERD -may use prilosec otc 20mg  daily 20 mins before largest meal for 6 weeks, then wean off  Labs/tests ordered:   Lab Orders     Iron, TIBC and Ferritin Panel Added to labs that were meant to be before  visit that she got today  Next appt:  6 mos for CPE, come fasting for labs  Juliani Laduke L. Kweli Grassel, D.O. [facility] [address], [place] [address] Cell Phone (Mon-Fri 8am-5pm):  [phone] On Call:  [phone] & follow prompts after 5pm & weekends Office Phone:  [phone] Office Fax:  [phone]

## 2019-06-19 NOTE — Patient Instructions (Addendum)
Please resume use of your compression hose when up and about, then take them off at bedtime.    Take prilosec OTC 20 minutes before your largest meal for 6 weeks.  Then try to wean off.    Please avoid large crowds at the holidays especially if your family is not adherent to masking and social distancing.  Practice good hygiene and wear your mask.

## 2019-06-20 LAB — COMPLETE METABOLIC PANEL WITH GFR
AG Ratio: 1.6 (calc) (ref 1.0–2.5)
ALT: 12 U/L (ref 6–29)
AST: 14 U/L (ref 10–35)
Albumin: 4.1 g/dL (ref 3.6–5.1)
Alkaline phosphatase (APISO): 60 U/L (ref 37–153)
BUN/Creatinine Ratio: 15 (calc) (ref 6–22)
BUN: 22 mg/dL (ref 7–25)
CO2: 27 mmol/L (ref 20–32)
Calcium: 9.7 mg/dL (ref 8.6–10.4)
Chloride: 104 mmol/L (ref 98–110)
Creat: 1.51 mg/dL — ABNORMAL HIGH (ref 0.60–0.88)
GFR, Est African American: 37 mL/min/{1.73_m2} — ABNORMAL LOW (ref >=60)
GFR, Est Non African American: 32 mL/min/{1.73_m2} — ABNORMAL LOW (ref >=60)
Globulin: 2.6 g/dL (calc) (ref 1.9–3.7)
Glucose, Bld: 104 mg/dL — ABNORMAL HIGH (ref 65–99)
Potassium: 5.4 mmol/L — ABNORMAL HIGH (ref 3.5–5.3)
Sodium: 137 mmol/L (ref 135–146)
Total Bilirubin: 0.4 mg/dL (ref 0.2–1.2)
Total Protein: 6.7 g/dL (ref 6.1–8.1)

## 2019-06-20 LAB — IRON,TIBC AND FERRITIN PANEL
%SAT: 27 % (calc) (ref 16–45)
Ferritin: 25 ng/mL (ref 16–288)
Iron: 85 ug/dL (ref 45–160)
TIBC: 317 mcg/dL (calc) (ref 250–450)

## 2019-06-20 LAB — CBC WITH DIFFERENTIAL/PLATELET
Absolute Monocytes: 933 cells/uL (ref 200–950)
Basophils Absolute: 44 cells/uL (ref 0–200)
Basophils Relative: 0.5 %
Eosinophils Absolute: 282 cells/uL (ref 15–500)
Eosinophils Relative: 3.2 %
HCT: 35.6 % (ref 35.0–45.0)
Hemoglobin: 11.8 g/dL (ref 11.7–15.5)
Lymphs Abs: 2438 cells/uL (ref 850–3900)
MCH: 30 pg (ref 27.0–33.0)
MCHC: 33.1 g/dL (ref 32.0–36.0)
MCV: 90.6 fL (ref 80.0–100.0)
MPV: 10.4 fL (ref 7.5–12.5)
Monocytes Relative: 10.6 %
Neutro Abs: 5104 cells/uL (ref 1500–7800)
Neutrophils Relative %: 58 %
Platelets: 260 10*3/uL (ref 140–400)
RBC: 3.93 10*6/uL (ref 3.80–5.10)
RDW: 12.3 % (ref 11.0–15.0)
Total Lymphocyte: 27.7 %
WBC: 8.8 10*3/uL (ref 3.8–10.8)

## 2019-06-20 LAB — HEMOGLOBIN A1C
Hgb A1c MFr Bld: 7.4 % of total Hgb — ABNORMAL HIGH (ref <5.7)
Mean Plasma Glucose: 166 (calc)
eAG (mmol/L): 9.2 (calc)

## 2019-06-20 LAB — LIPID PANEL
Cholesterol: 140 mg/dL (ref <200)
HDL: 53 mg/dL (ref >=50)
LDL Cholesterol (Calc): 66 mg/dL (calc)
Non-HDL Cholesterol (Calc): 87 mg/dL (calc) (ref <130)
Total CHOL/HDL Ratio: 2.6 (calc) (ref <5.0)
Triglycerides: 131 mg/dL (ref <150)

## 2019-06-26 ENCOUNTER — Other Ambulatory Visit: Payer: Self-pay | Admitting: Internal Medicine

## 2019-07-10 ENCOUNTER — Ambulatory Visit: Payer: Medicare Other | Admitting: Orthopaedic Surgery

## 2019-11-08 ENCOUNTER — Other Ambulatory Visit: Payer: Self-pay | Admitting: Internal Medicine

## 2019-11-10 NOTE — Telephone Encounter (Signed)
rx sent to pharmacy by e-script  

## 2019-12-18 ENCOUNTER — Ambulatory Visit: Payer: Medicare Other | Admitting: Internal Medicine

## 2020-02-04 ENCOUNTER — Telehealth: Payer: Self-pay

## 2020-02-04 DIAGNOSIS — E781 Pure hyperglyceridemia: Secondary | ICD-10-CM

## 2020-02-04 DIAGNOSIS — I10 Essential (primary) hypertension: Secondary | ICD-10-CM

## 2020-02-04 DIAGNOSIS — E113593 Type 2 diabetes mellitus with proliferative diabetic retinopathy without macular edema, bilateral: Secondary | ICD-10-CM

## 2020-02-04 NOTE — Telephone Encounter (Addendum)
Patient called the office and we tried to find her another appointment but there were not any other ones at 8:00 so she said she would keep the one she had and wanted to know if she could do her blood work at 8:00 since her appointment is at 8:30 I told her I would find out and let her know there are no orders placed in chart for her at this time Please Advise

## 2020-02-04 NOTE — Telephone Encounter (Addendum)
Patient called to request that her up coming appointment be changed to a tele-visit as she is having a lot of family issues as her brother in law has just passed and he husband is having some health issues and it would make things a lot easier on her if she could do the visit on the phone she says nothing has change with her health but if it is necessary she will come in  (Will this be okay for patient to do,)   Please Advise

## 2020-02-04 NOTE — Telephone Encounter (Signed)
I'm sorry to hear about all of this.  She is meant to have her physical which cannot be done by phone and get labs the same day.  We could postpone this and do a check-in visit by phone, but the original visits must be moved not just canceled.

## 2020-02-06 NOTE — Addendum Note (Signed)
Addended by: Gayland Curry on: 02/06/2020 06:58 AM   Modules accepted: Orders

## 2020-02-06 NOTE — Telephone Encounter (Signed)
I put in the orders so she can get her labs 6/14 at 8am before the 8:30 appt.

## 2020-02-09 ENCOUNTER — Encounter: Payer: Self-pay | Admitting: Internal Medicine

## 2020-02-09 ENCOUNTER — Other Ambulatory Visit: Payer: Self-pay

## 2020-02-09 ENCOUNTER — Ambulatory Visit (INDEPENDENT_AMBULATORY_CARE_PROVIDER_SITE_OTHER): Payer: Medicare Other | Admitting: Internal Medicine

## 2020-02-09 VITALS — BP 138/82 | HR 84 | Temp 96.7°F | Ht 64.0 in | Wt 197.2 lb

## 2020-02-09 DIAGNOSIS — E113593 Type 2 diabetes mellitus with proliferative diabetic retinopathy without macular edema, bilateral: Secondary | ICD-10-CM

## 2020-02-09 DIAGNOSIS — D649 Anemia, unspecified: Secondary | ICD-10-CM | POA: Diagnosis not present

## 2020-02-09 DIAGNOSIS — E781 Pure hyperglyceridemia: Secondary | ICD-10-CM

## 2020-02-09 DIAGNOSIS — I951 Orthostatic hypotension: Secondary | ICD-10-CM | POA: Diagnosis not present

## 2020-02-09 DIAGNOSIS — I10 Essential (primary) hypertension: Secondary | ICD-10-CM | POA: Diagnosis not present

## 2020-02-09 DIAGNOSIS — K219 Gastro-esophageal reflux disease without esophagitis: Secondary | ICD-10-CM

## 2020-02-09 DIAGNOSIS — F411 Generalized anxiety disorder: Secondary | ICD-10-CM

## 2020-02-09 DIAGNOSIS — E11319 Type 2 diabetes mellitus with unspecified diabetic retinopathy without macular edema: Secondary | ICD-10-CM

## 2020-02-09 DIAGNOSIS — Z66 Do not resuscitate: Secondary | ICD-10-CM

## 2020-02-09 DIAGNOSIS — Z636 Dependent relative needing care at home: Secondary | ICD-10-CM

## 2020-02-09 NOTE — Progress Notes (Signed)
Location:  [facility] clinic Provider:  Alitza Cowman L. Mariea Clonts, D.O., C.M.D.  Code Status: DNR Goals of Care:  Advanced Directives 02/09/2020  Does Patient Have a Medical Advance Directive? Yes  Type of Advance Directive Out of facility DNR (pink MOST or yellow form)  Does patient want to make changes to medical advance directive? -  Copy of [place] in Chart? -  Would patient like information on creating a medical advance directive? -  Pre-existing out of facility DNR order (yellow form or pink MOST form) Pink MOST/Yellow Form most recent copy in chart - Physician notified to receive inpatient order     Chief Complaint  Patient presents with  . Medical Management of Chronic Issues    6 month follow up    HPI: Patient is a 84 y.o. female seen today for medical management of chronic diseases.    She's stressed and worried about her husband.  He's had several strokes and at risk for a major one.  He's been incredibly confused since he returned home from the hospital and has been refusing his meds, urinating in the wrong places.  He has no recollection that he did these things.  He's about 75% better.  He's been having difficulty for several years with comprehension.  She does not want him to leave her.    She had her bloodwork immediately before the appt this morning.   Her blood pressure has been 78-146/44-76.  It was 136/82 here by CMA.  HR is 70s-80s majority of the time now.  Not using the lasix at all now.   Takes 1/2 losartan if bp is very high at night.  Takes nightly asa.  BP still drops low in day, but may start off high first thing in the am as high as 180 but quickly drops to lower hundreds, even upper 70s sometimes.  She thinks she is hydrating better.    DMII:  hba1c just drawn.  She's on her metformin and glipizide.  She admits she was forgetting her meds in the beginning part of the last three months.  She's been back on track and cbgs are back in double digits in  am.  She sometimes eats consistently.  Has only had 2 lows since her husband has been sick.  If there is oj, she'll have that, but usually has peanut butter or milk.  Admits today she had a 1/2 sweet potato at 10pm.  Her reflux is doing fine these days.  She has a family member that had a reaction to covid and 4 epipens were supposedly used.  Again counseled about covid vaccination importance for her and her husband.  Right knee bothers her if she walks on concrete.    Past Medical History:  Diagnosis Date  . Anxiety    regarding surgery; occassionally pt. states  . Anxiety state, unspecified   . Arthritis   . Asymptomatic varicose veins   . Chronic kidney disease   . Diabetic retinopathy ([place])   . Edema   . Hard of hearing   . Hyperpotassemia   . Hypertension   . Inflammation of joint of knee    right knee  . Insomnia, unspecified   . Multiple thyroid nodules   . Osteoarthrosis, unspecified whether generalized or localized, unspecified site   . Other abnormal blood chemistry   . Other and unspecified hyperlipidemia   . Other malaise and fatigue   . Other specified disease of nail   . Overweight(278.02)   .  Pain in joint, lower leg   . Pain in joint, pelvic region and thigh   . Peripheral vascular disease ([place])    varicose veins-  "Blood Clot anterior left LEG WITH PREGNANCY"  . PONV (postoperative nausea and vomiting)    pt. states that she was sick after hip surgery and oral surgery  . Type II or unspecified type diabetes mellitus with renal manifestations, not stated as uncontrolled(250.40)   . Undiagnosed cardiac murmurs   . Unspecified disorder of kidney and ureter   . Unspecified disorder of kidney and ureter   . Urinary tract infection, site not specified     Past Surgical History:  Procedure Laterality Date  . ABDOMINAL HYSTERECTOMY    . EYE SURGERY Bilateral    cataract extraction with IOL  . LIPOMA EXCISION     x2  bilateral buttocks  . spinal meningitis   1957  . TONSILLECTOMY    . TOTAL HIP ARTHROPLASTY Right 11/29/2012   Procedure: RIGHT TOTAL HIP ARTHROPLASTY ANTERIOR APPROACH;  Surgeon: Mcarthur Rossetti, MD;  Location: [facility];  Service: Orthopedics;  Laterality: Right;  . TOTAL HIP ARTHROPLASTY Left 06/01/2015   Procedure: LEFT TOTAL HIP ARTHROPLASTY ANTERIOR APPROACH;  Surgeon: Mcarthur Rossetti, MD;  Location: [facility];  Service: Orthopedics;  Laterality: Left;    Allergies  Allergen Reactions  . Naproxen Swelling and Other (See Comments)    Blacked out - Face swelling   . Sulfur Hives  . Tramadol Nausea And Vomiting    Outpatient Encounter Medications as of 02/09/2020  Medication Sig  . ACCU-CHEK FASTCLIX LANCETS MISC Test blood sugar twice daily DX E08.319  . ACCU-CHEK SMARTVIEW test strip USE TO CHECK BLOOD SUGAR  TWO TIMES DAILY  . aspirin EC 81 MG tablet Take 81 mg by mouth daily.  . diclofenac sodium (VOLTAREN) 1 % GEL APPLY TOPICALLY 2 TO 4  GRAMS UP TO 4 TIMES DAILY  . glipiZIDE (GLUCOTROL) 5 MG tablet TAKE 1 TABLET BY MOUTH   TWICE A DAY BEFORE MEALS   (BREAKFAST AND LUNCH)  . metFORMIN (GLUCOPHAGE) 500 MG tablet TAKE 2 TABLETS BY MOUTH TWO TIMES DAILY WITH MEALS  . omeprazole (PRILOSEC OTC) 20 MG tablet Take 1 tablet (20 mg total) by mouth daily before supper.  . Propylene Glycol (SYSTANE BALANCE OP) Apply 1 drop to eye daily as needed (dry eyes).  . simvastatin (ZOCOR) 40 MG tablet TAKE 1 TABLET BY MOUTH  DAILY  . [DISCONTINUED] furosemide (LASIX) 20 MG tablet TAKE 1 TABLET BY MOUTH  DAILY FOR BLOOD PRESSURE  AND EDEMA   No facility-administered encounter medications on file as of 02/09/2020.    Review of Systems:  Review of Systems  Constitutional: Negative for chills, fever and malaise/fatigue.  Eyes: Negative for blurred vision.  Respiratory: Negative for cough and shortness of breath.   Cardiovascular: Negative for chest pain, palpitations and leg swelling.  Gastrointestinal: Negative for abdominal pain,  blood in stool, constipation, diarrhea, heartburn and melena.  Genitourinary: Negative for dysuria.  Musculoskeletal: Negative for back pain, falls and joint pain.  Skin: Negative for itching and rash.  Neurological: Positive for dizziness. Negative for loss of consciousness.  Psychiatric/Behavioral: Negative for depression and memory loss. The patient is nervous/anxious. The patient does not have insomnia.     Health Maintenance  Topic Date Due  . COVID-19 Vaccine (1) Never done  . OPHTHALMOLOGY EXAM  10/23/2018  . HEMOGLOBIN A1C  12/18/2019  . DEXA SCAN  08/29/2023 (Originally 04/12/2003)  .  INFLUENZA VACCINE  03/28/2020  . FOOT EXAM  06/18/2020  . PNA vac Low Risk Adult  Completed    Physical Exam: Vitals:   02/09/20 0830  BP: 138/82  Pulse: 84  Temp: (!) 96.7 F (35.9 C)  TempSrc: Temporal  SpO2: 90%  Weight: 197 lb 3.2 oz (89.4 kg)  Height: 5\' 4"  (1.626 m)   Body mass index is 33.85 kg/m. Physical Exam Vitals reviewed.  Constitutional:      Appearance: Normal appearance.  Eyes:     Extraocular Movements: Extraocular movements intact.     Pupils: Pupils are equal, round, and reactive to light.  Cardiovascular:     Rate and Rhythm: Normal rate and regular rhythm.     Pulses: Normal pulses.     Heart sounds: Normal heart sounds.  Pulmonary:     Effort: Pulmonary effort is normal.     Breath sounds: Normal breath sounds. No wheezing, rhonchi or rales.  Abdominal:     General: Bowel sounds are normal.  Musculoskeletal:        General: Normal range of motion.     Right lower leg: Edema present.     Left lower leg: Edema present.  Skin:    General: Skin is warm and dry.  Neurological:     General: No focal deficit present.     Mental Status: She is alert and oriented to person, place, and time.  Psychiatric:        Mood and Affect: Mood normal.        Behavior: Behavior normal.     Comments: Tearful and anxious     Labs reviewed: Basic Metabolic  Panel: Recent Labs    06/19/19 1014  NA 137  K 5.4*  CL 104  CO2 27  GLUCOSE 104*  BUN 22  CREATININE 1.51*  CALCIUM 9.7   Liver Function Tests: Recent Labs    06/19/19 1014  AST 14  ALT 12  BILITOT 0.4  PROT 6.7   No results for input(s): LIPASE, AMYLASE in the last 8760 hours. No results for input(s): AMMONIA in the last 8760 hours. CBC: Recent Labs    06/19/19 1014  WBC 8.8  NEUTROABS 5,104  HGB 11.8  HCT 35.6  MCV 90.6  PLT 260   Lipid Panel: Recent Labs    06/19/19 1014  CHOL 140  HDL 53  LDLCALC 66  TRIG 131  CHOLHDL 2.6   Lab Results  Component Value Date   HGBA1C 7.4 (H) 06/19/2019    Procedures since last visit: No results found.  Assessment/Plan 1. Type 2 diabetes mellitus with retinopathy of both eyes, without long-term current use of insulin, macular edema presence unspecified, unspecified retinopathy severity (HCC) -hba1c pending so not sure where this will be with current stress levels  2. Essential hypertension -bp high in am, but then low quickly thereafter  3. Hypertriglyceridemia -flp pending  4. Orthostatic hypotension -ongoing issue, still uses prn losartan for SBP over 180 in am but not using diuretic anymore, wearing hose for edema (supposed to be anyway)  5. Generalized anxiety disorder -worse right now with caregiver stress, not wanting medication  6. Gastroesophageal reflux disease, unspecified whether esophagitis present -cont same regimen and monitor, doing better on this  7. Caregiver stress -due to husband's illness, recommended having home care aide assistance  8. DNR (do not resuscitate) - Do not attempt resuscitation (DNR) order reentered, on file in vynca  Labs/tests ordered:   Lab Orders  CBC with Differential/Platelet     COMPLETE METABOLIC PANEL WITH GFR     Hemoglobin A1c     Lipid panel  Next appt:  6 mos for CPE, fasting labs before  45 mins spent with patient due to counseling re  caregiver stress  Wolf Boulay L. Nara Paternoster, D.O. [facility] [address], [place] [address] Cell Phone (Mon-Fri 8am-5pm):  [phone] On Call:  [phone] & follow prompts after 5pm & weekends Office Phone:  [phone] Office Fax:  [phone]

## 2020-02-10 LAB — CBC WITH DIFFERENTIAL/PLATELET
Absolute Monocytes: 1118 cells/uL — ABNORMAL HIGH (ref 200–950)
Basophils Absolute: 43 cells/uL (ref 0–200)
Basophils Relative: 0.5 %
Eosinophils Absolute: 344 cells/uL (ref 15–500)
Eosinophils Relative: 4 %
HCT: 35.2 % (ref 35.0–45.0)
Hemoglobin: 11.9 g/dL (ref 11.7–15.5)
Lymphs Abs: 1969 cells/uL (ref 850–3900)
MCH: 30.7 pg (ref 27.0–33.0)
MCHC: 33.8 g/dL (ref 32.0–36.0)
MCV: 91 fL (ref 80.0–100.0)
MPV: 10.7 fL (ref 7.5–12.5)
Monocytes Relative: 13 %
Neutro Abs: 5126 cells/uL (ref 1500–7800)
Neutrophils Relative %: 59.6 %
Platelets: 271 10*3/uL (ref 140–400)
RBC: 3.87 10*6/uL (ref 3.80–5.10)
RDW: 12.8 % (ref 11.0–15.0)
Total Lymphocyte: 22.9 %
WBC: 8.6 10*3/uL (ref 3.8–10.8)

## 2020-02-10 LAB — COMPLETE METABOLIC PANEL WITH GFR
AG Ratio: 1.5 (calc) (ref 1.0–2.5)
ALT: 10 U/L (ref 6–29)
AST: 13 U/L (ref 10–35)
Albumin: 4 g/dL (ref 3.6–5.1)
Alkaline phosphatase (APISO): 71 U/L (ref 37–153)
BUN/Creatinine Ratio: 14 (calc) (ref 6–22)
BUN: 20 mg/dL (ref 7–25)
CO2: 24 mmol/L (ref 20–32)
Calcium: 9.4 mg/dL (ref 8.6–10.4)
Chloride: 105 mmol/L (ref 98–110)
Creat: 1.46 mg/dL — ABNORMAL HIGH (ref 0.60–0.88)
GFR, Est African American: 39 mL/min/{1.73_m2} — ABNORMAL LOW (ref >=60)
GFR, Est Non African American: 33 mL/min/{1.73_m2} — ABNORMAL LOW (ref >=60)
Globulin: 2.6 g/dL (calc) (ref 1.9–3.7)
Glucose, Bld: 109 mg/dL — ABNORMAL HIGH (ref 65–99)
Potassium: 4.5 mmol/L (ref 3.5–5.3)
Sodium: 139 mmol/L (ref 135–146)
Total Bilirubin: 0.5 mg/dL (ref 0.2–1.2)
Total Protein: 6.6 g/dL (ref 6.1–8.1)

## 2020-02-10 LAB — LIPID PANEL
Cholesterol: 134 mg/dL (ref <200)
HDL: 52 mg/dL (ref >=50)
LDL Cholesterol (Calc): 59 mg/dL (calc)
Non-HDL Cholesterol (Calc): 82 mg/dL (calc) (ref <130)
Total CHOL/HDL Ratio: 2.6 (calc) (ref <5.0)
Triglycerides: 149 mg/dL (ref <150)

## 2020-02-10 LAB — IRON,TIBC AND FERRITIN PANEL
%SAT: 21 % (calc) (ref 16–45)
Ferritin: 17 ng/mL (ref 16–288)
Iron: 69 ug/dL (ref 45–160)
TIBC: 335 mcg/dL (calc) (ref 250–450)

## 2020-02-10 LAB — HEMOGLOBIN A1C
Hgb A1c MFr Bld: 7 % of total Hgb — ABNORMAL HIGH (ref <5.7)
Mean Plasma Glucose: 154 (calc)
eAG (mmol/L): 8.5 (calc)

## 2020-02-10 NOTE — Progress Notes (Signed)
Iron panel is still normal just like last time (didn't think I requested it b/c she's not been anemic the last time).  Blood count also still normal Cholesterol is at goal so no med changes needed for it. Kidney function is stable.  It's important to hydrate with water especially in the heat and avoid ibuprofen, aleve, motrin, advil, etc and use tylenol or topical treatments for pain or get another steroid shot in knee if needed Liver and electrolytes are normal. Sugar average actually did NOT go up which is great--it was 7.0 which is excellent.

## 2020-02-17 ENCOUNTER — Other Ambulatory Visit: Payer: Self-pay | Admitting: Internal Medicine

## 2020-02-18 NOTE — Telephone Encounter (Signed)
rx sent to pharmacy by e-script  

## 2020-02-29 ENCOUNTER — Other Ambulatory Visit: Payer: Self-pay | Admitting: Internal Medicine

## 2020-03-29 ENCOUNTER — Telehealth: Payer: Self-pay | Admitting: Orthopaedic Surgery

## 2020-03-29 NOTE — Telephone Encounter (Signed)
Patient called requesting Dr. Trevor Mace assistant or nurse call patient insurance for approval of gel injections. Please call patient about this matter. Patient phone number is [phone].

## 2020-03-29 NOTE — Telephone Encounter (Signed)
Pt hasnt been seen since 2018. Will need to come in for an office visit

## 2020-03-29 NOTE — Telephone Encounter (Signed)
Can we see about getting auth for injections?

## 2020-03-30 NOTE — Telephone Encounter (Signed)
noted 

## 2020-03-30 NOTE — Telephone Encounter (Signed)
Pt was ok with this plan....placed on [place] schedule next week for updated xrays and treatment plan

## 2020-04-07 ENCOUNTER — Ambulatory Visit: Payer: Self-pay

## 2020-04-07 ENCOUNTER — Telehealth: Payer: Self-pay | Admitting: Radiology

## 2020-04-07 ENCOUNTER — Ambulatory Visit: Payer: Medicare Other | Admitting: Physician Assistant

## 2020-04-07 ENCOUNTER — Encounter: Payer: Self-pay | Admitting: Physician Assistant

## 2020-04-07 DIAGNOSIS — M1711 Unilateral primary osteoarthritis, right knee: Secondary | ICD-10-CM

## 2020-04-07 DIAGNOSIS — M1712 Unilateral primary osteoarthritis, left knee: Secondary | ICD-10-CM | POA: Diagnosis not present

## 2020-04-07 NOTE — Progress Notes (Signed)
Office Visit Note   Patient: Julie Baxter           Date of Birth: [birth year]           MRN: [id number] Visit Date: 04/07/2020              Requested by: Gayland Curry, DO [place],  [place] [address] PCP: Gayland Curry, DO   Assessment & Plan: Visit Diagnoses:  1. Primary osteoarthritis of right knee   2. Primary osteoarthritis of left knee     Plan: We will order a supplemental injection for the right knee.  She would like to stay away from surgical intervention if at all possible.  She has had good results with the supplemental injections in the past and has been several years since she last had the last injection.  Questions were encouraged and answered.   Follow-Up Instructions: Return for Supplemental injection.   Orders:  Orders Placed This Encounter  Procedures  . XR Knee 1-2 Views Right   No orders of the defined types were placed in this encounter.     Procedures: No procedures performed   Clinical Data: No additional findings.   Subjective: Chief Complaint  Patient presents with  . Right Knee - Pain    HPI Julie Baxter is well-known to our department service comes in today with right knee pain mostly involving the lateral aspect of the knee.  She states that occasionally she has some burning pain medial aspect of the knee.  She is not having really any pain in the left knee.  She has known end-stage arthritis of both knees.  Last underwent a supplemental injection right knee 08/02/2017 and has done well until recently.  No new injuries.  Patient is diabetic with her last hemoglobin A1c 1 month ago was 7.0.  Review of Systems See HPI otherwise negative or noncontributory.  Objective: Vital Signs: There were no vitals taken for this visit.  Physical Exam General: Well-developed well-nourished female no acute distress mood and affect appropriate. Psych: Alert and oriented x3 Ortho Exam Right knee no abnormal warmth erythema.  Good  range of motion of the knee.  Tenderness along lateral joint line.  Valgus deformity to the knee.  Positive Tello femoral crepitus passive range of motion. Specialty Comments:  No specialty comments available.  Imaging: XR Knee 1-2 Views Right  Result Date: 04/07/2020 Right knee AP lateral views: End-stage arthritis involving the lateral compartment and the patellofemoral compartment.  Medial compartment is overall well-preserved.  No acute fractures.  Bone appears osteopenic.    PMFS History: Patient Active Problem List   Diagnosis Date Noted  . Orthostatic hypotension 12/12/2018  . Advance care planning 09/13/2017  . Orthostatic dizziness 12/16/2015  . Diabetes mellitus due to underlying condition with retinopathy ([place]) 12/16/2015  . Hypertriglyceridemia 12/16/2015  . Post-traumatic osteoarthritis of right knee 12/16/2015  . Essential hypertension 12/16/2015  . Osteoarthritis of left hip 06/01/2015  . Status post total replacement of left hip 06/01/2015  . Dizzy spells 12/09/2014  . Cystic thyroid nodule 12/09/2014  . Generalized anxiety disorder 11/25/2014  . Diabetic retinopathy ([place])   . Other and unspecified hyperlipidemia   . Degenerative arthritis of hip 11/29/2012   Past Medical History:  Diagnosis Date  . Anxiety    regarding surgery; occassionally pt. states  . Anxiety state, unspecified   . Arthritis   . Asymptomatic varicose veins   . Chronic kidney disease   . Diabetic  retinopathy ([place])   . Edema   . Hard of hearing   . Hyperpotassemia   . Hypertension   . Inflammation of joint of knee    right knee  . Insomnia, unspecified   . Multiple thyroid nodules   . Osteoarthrosis, unspecified whether generalized or localized, unspecified site   . Other abnormal blood chemistry   . Other and unspecified hyperlipidemia   . Other malaise and fatigue   . Other specified disease of nail   . Overweight(278.02)   . Pain in joint, lower leg   . Pain in joint, pelvic  region and thigh   . Peripheral vascular disease ([place])    varicose veins-  "Blood Clot anterior left LEG WITH PREGNANCY"  . PONV (postoperative nausea and vomiting)    pt. states that she was sick after hip surgery and oral surgery  . Type II or unspecified type diabetes mellitus with renal manifestations, not stated as uncontrolled(250.40)   . Undiagnosed cardiac murmurs   . Unspecified disorder of kidney and ureter   . Unspecified disorder of kidney and ureter   . Urinary tract infection, site not specified     History reviewed. No pertinent family history.  Past Surgical History:  Procedure Laterality Date  . ABDOMINAL HYSTERECTOMY    . EYE SURGERY Bilateral    cataract extraction with IOL  . LIPOMA EXCISION     x2  bilateral buttocks  . spinal meningitis  1957  . TONSILLECTOMY    . TOTAL HIP ARTHROPLASTY Right 11/29/2012   Procedure: RIGHT TOTAL HIP ARTHROPLASTY ANTERIOR APPROACH;  Surgeon: Mcarthur Rossetti, MD;  Location: [facility];  Service: Orthopedics;  Laterality: Right;  . TOTAL HIP ARTHROPLASTY Left 06/01/2015   Procedure: LEFT TOTAL HIP ARTHROPLASTY ANTERIOR APPROACH;  Surgeon: Mcarthur Rossetti, MD;  Location: [facility];  Service: Orthopedics;  Laterality: Left;   Social History   Occupational History  . Not on file  Tobacco Use  . Smoking status: Never Smoker  . Smokeless tobacco: Never Used  Vaping Use  . Vaping Use: Never used  Substance and Sexual Activity  . Alcohol use: No  . Drug use: No  . Sexual activity: Not on file

## 2020-04-07 NOTE — Telephone Encounter (Signed)
Right knee supplemental injection 

## 2020-04-08 ENCOUNTER — Telehealth: Payer: Self-pay

## 2020-04-08 NOTE — Telephone Encounter (Signed)
Submitted VOB, Durolane, right knee. 

## 2020-04-08 NOTE — Telephone Encounter (Signed)
Noted  

## 2020-04-12 ENCOUNTER — Telehealth: Payer: Self-pay

## 2020-04-12 NOTE — Telephone Encounter (Signed)
Approved, Durolane, right knee. [place] Patient will be responsible for 20% OOP. Co-pay of $35.00  No PA required  Appt.04/19/2020

## 2020-04-19 ENCOUNTER — Encounter: Payer: Self-pay | Admitting: Physician Assistant

## 2020-04-19 ENCOUNTER — Ambulatory Visit: Payer: Medicare Other | Admitting: Physician Assistant

## 2020-04-19 DIAGNOSIS — M1711 Unilateral primary osteoarthritis, right knee: Secondary | ICD-10-CM | POA: Diagnosis not present

## 2020-04-19 MED ORDER — SODIUM HYALURONATE 60 MG/3ML IX PRSY
60.0000 mg | PREFILLED_SYRINGE | INTRA_ARTICULAR | Status: AC | PRN
  Administered 2020-04-19: 60 mg via INTRA_ARTICULAR

## 2020-04-19 NOTE — Progress Notes (Signed)
   Procedure Note  Patient: Julie Baxter             Date of Birth: [birth year]           MRN: [id number]             Visit Date: 04/19/2020 HPI: Julie Baxter comes in today for right knee Durolane injection.  She has had no new injury to the knee.  She has known osteoarthritis of the right knee.  Physical exam: Right knee no abnormal warmth erythema or effusion.  Ambulates without assistive device.  Good range of motion right knee.  Procedures: Visit Diagnoses:  1. Primary osteoarthritis of right knee     Large Joint Inj: R knee on 04/19/2020 11:42 AM Indications: pain Details: 22 G 1.5 in needle, anterolateral approach  Arthrogram: No  Medications: 60 mg Sodium Hyaluronate 60 MG/3ML Outcome: tolerated well, no immediate complications Procedure, treatment alternatives, risks and benefits explained, specific risks discussed. Consent was given by the patient. Immediately prior to procedure a time out was called to verify the correct patient, procedure, equipment, support staff and site/side marked as required. Patient was prepped and draped in the usual sterile fashion.     Plan: We will see her back in 8 weeks to see what type of response she had to the injection.  Continue to work on Forensic scientist.  Questions were encouraged and answered.

## 2020-07-15 ENCOUNTER — Other Ambulatory Visit: Payer: Self-pay

## 2020-07-15 ENCOUNTER — Other Ambulatory Visit: Payer: Self-pay | Admitting: Physician Assistant

## 2020-07-15 ENCOUNTER — Ambulatory Visit (INDEPENDENT_AMBULATORY_CARE_PROVIDER_SITE_OTHER): Payer: Medicare Other | Admitting: Ophthalmology

## 2020-07-15 ENCOUNTER — Encounter (INDEPENDENT_AMBULATORY_CARE_PROVIDER_SITE_OTHER): Payer: Self-pay | Admitting: Ophthalmology

## 2020-07-15 ENCOUNTER — Telehealth: Payer: Self-pay | Admitting: Orthopaedic Surgery

## 2020-07-15 DIAGNOSIS — E113553 Type 2 diabetes mellitus with stable proliferative diabetic retinopathy, bilateral: Secondary | ICD-10-CM | POA: Diagnosis not present

## 2020-07-15 DIAGNOSIS — Z961 Presence of intraocular lens: Secondary | ICD-10-CM | POA: Diagnosis not present

## 2020-07-15 DIAGNOSIS — H43813 Vitreous degeneration, bilateral: Secondary | ICD-10-CM

## 2020-07-15 MED ORDER — DICLOFENAC SODIUM 1 % EX GEL: 4.0000 g | Freq: Four times a day (QID) | CUTANEOUS | 1 refills | Status: DC

## 2020-07-15 NOTE — Telephone Encounter (Signed)
Looks like one of Gil's patients.  Recommend against oral diclofenac due to decreased kidney function, if Artis Delay is out of town. Okay for topical voltaren OTC

## 2020-07-15 NOTE — Assessment & Plan Note (Signed)

## 2020-07-15 NOTE — Telephone Encounter (Signed)
Please advise. Thanks.  

## 2020-07-15 NOTE — Progress Notes (Signed)
07/15/2020     CHIEF COMPLAINT Patient presents for Retina Follow Up   HISTORY OF PRESENT ILLNESS: Julie Baxter is a 84 y.o. female who presents to the clinic today for:   HPI    Retina Follow Up    Patient presents with  Diabetic Retinopathy.  In right eye.  This started 1.5 years ago.  Severity is mild.  Duration of 1.5.          Comments    1 YR F/U  Pt reports stable vision, a floater that comes and goes.    Last A1C: 7 taken 01/2020   Last BS: 97 take this AM        Last edited by Nichola Sizer D on 07/15/2020  8:25 AM. (History)      Referring physician: Gayland Curry, DO [place],  [place] [address]  HISTORICAL INFORMATION:   Selected notes from the MEDICAL RECORD NUMBER    Lab Results  Component Value Date   HGBA1C 7.0 (H) 02/09/2020     CURRENT MEDICATIONS: Current Outpatient Medications (Ophthalmic Drugs)  Medication Sig  . Propylene Glycol (SYSTANE BALANCE OP) Apply 1 drop to eye daily as needed (dry eyes).   No current facility-administered medications for this visit. (Ophthalmic Drugs)   Current Outpatient Medications (Other)  Medication Sig  . ACCU-CHEK FASTCLIX LANCETS MISC Test blood sugar twice daily DX E08.319  . ACCU-CHEK SMARTVIEW test strip USE TO CHECK BLOOD SUGAR  TWO TIMES DAILY  . aspirin EC 81 MG tablet Take 81 mg by mouth daily.  . diclofenac sodium (VOLTAREN) 1 % GEL APPLY TOPICALLY 2 TO 4  GRAMS UP TO 4 TIMES DAILY  . furosemide (LASIX) 20 MG tablet Take 20 mg by mouth daily.  Marland Kitchen glipiZIDE (GLUCOTROL) 5 MG tablet TAKE 1 TABLET BY MOUTH   TWICE A DAY BEFORE MEALS   (BREAKFAST AND LUNCH)  . metFORMIN (GLUCOPHAGE) 500 MG tablet TAKE 2 TABLETS BY MOUTH  TWICE DAILY WITH MEALS  . omeprazole (PRILOSEC OTC) 20 MG tablet Take 1 tablet (20 mg total) by mouth daily before supper.  . simvastatin (ZOCOR) 40 MG tablet TAKE 1 TABLET BY MOUTH  DAILY   No current facility-administered medications for this visit.  (Other)      REVIEW OF SYSTEMS:    ALLERGIES Allergies  Allergen Reactions  . Naproxen Swelling and Other (See Comments)    Blacked out - Face swelling   . Sulfur Hives  . Tramadol Nausea And Vomiting    PAST MEDICAL HISTORY Past Medical History:  Diagnosis Date  . Anxiety    regarding surgery; occassionally pt. states  . Anxiety state, unspecified   . Arthritis   . Asymptomatic varicose veins   . Chronic kidney disease   . Diabetic retinopathy ([place])   . Edema   . Hard of hearing   . Hyperpotassemia   . Hypertension   . Inflammation of joint of knee    right knee  . Insomnia, unspecified   . Multiple thyroid nodules   . Osteoarthrosis, unspecified whether generalized or localized, unspecified site   . Other abnormal blood chemistry   . Other and unspecified hyperlipidemia   . Other malaise and fatigue   . Other specified disease of nail   . Overweight(278.02)   . Pain in joint, lower leg   . Pain in joint, pelvic region and thigh   . Peripheral vascular disease ([place])    varicose veins-  "Blood  Clot anterior left LEG WITH PREGNANCY"  . PONV (postoperative nausea and vomiting)    pt. states that she was sick after hip surgery and oral surgery  . Type II or unspecified type diabetes mellitus with renal manifestations, not stated as uncontrolled(250.40)   . Undiagnosed cardiac murmurs   . Unspecified disorder of kidney and ureter   . Unspecified disorder of kidney and ureter   . Urinary tract infection, site not specified    Past Surgical History:  Procedure Laterality Date  . ABDOMINAL HYSTERECTOMY    . EYE SURGERY Bilateral    cataract extraction with IOL  . LIPOMA EXCISION     x2  bilateral buttocks  . spinal meningitis  1957  . TONSILLECTOMY    . TOTAL HIP ARTHROPLASTY Right 11/29/2012   Procedure: RIGHT TOTAL HIP ARTHROPLASTY ANTERIOR APPROACH;  Surgeon: Mcarthur Rossetti, MD;  Location: [facility];  Service: Orthopedics;  Laterality: Right;  . TOTAL  HIP ARTHROPLASTY Left 06/01/2015   Procedure: LEFT TOTAL HIP ARTHROPLASTY ANTERIOR APPROACH;  Surgeon: Mcarthur Rossetti, MD;  Location: [facility];  Service: Orthopedics;  Laterality: Left;    FAMILY HISTORY History reviewed. No pertinent family history.  SOCIAL HISTORY Social History   Tobacco Use  . Smoking status: Never Smoker  . Smokeless tobacco: Never Used  Vaping Use  . Vaping Use: Never used  Substance Use Topics  . Alcohol use: No  . Drug use: No         OPHTHALMIC EXAM:  Base Eye Exam    Visual Acuity (ETDRS)      Right Left   Dist [place] 20/20 -1 20/25 +1       Tonometry (Tonopen, 8:31 AM)      Right Left   Pressure 10 14       Pupils      Pupils Dark Light Shape React APD   Right PERRL 5 4 Round Brisk None   Left PERRL 5 4 Round Brisk None       Visual Fields (Counting fingers)      Left Right    Full Full       Extraocular Movement      Right Left    Full Full       Neuro/Psych    Oriented x3: Yes       Dilation    Both eyes: 1.0% Mydriacyl, 2.5% Phenylephrine @ 8:31 AM        Slit Lamp and Fundus Exam    External Exam      Right Left   External Normal Normal       Slit Lamp Exam      Right Left   Lids/Lashes Normal Normal   Conjunctiva/Sclera White and quiet White and quiet   Cornea Clear Clear   Anterior Chamber Deep and quiet Deep and quiet   Iris Round and reactive Round and reactive   Lens Centered posterior chamber intraocular lens Centered posterior chamber intraocular lens   Anterior Vitreous Normal Normal       Fundus Exam      Right Left   Posterior Vitreous Posterior vitreous detachment Posterior vitreous detachment   Disc Normal Normal   C/D Ratio 0.2 0.2   Macula Normal Good focal laser temporally   Vessels PDR-quiet PDR-quiet   Periphery Moderate scatter photocoagulation nasally Moderate scatter photocoagulation nasally          IMAGING AND PROCEDURES  Imaging and Procedures for 07/15/20  Color Fundus  Photography  [facility] - OU - Both Eyes       Right Eye Progression has been stable. Macula : microaneurysms.   Left Eye Progression has been stable. Disc findings include normal observations. Macula : microaneurysms.   Notes Good focal laser scars temporally left eye  Quiescent PDR, we was only early PDR in the past,, watch anteriorly OS  Good PRP nasally OU stable                ASSESSMENT/PLAN:  Posterior vitreous detachment of both eyes   The nature of posterior vitreous detachment was discussed with the patient as well as its physiology, its age prevalence, and its possible implication regarding retinal breaks and detachment.  An informational brochure was given to the patient.  All the patient's questions were answered.  The patient was asked to return if new or different flashes or floaters develops.   Patient was instructed to contact office immediately if any changes were noticed. I explained to the patient that vitreous inside the eye is similar to jello inside a bowl. As the jello melts it can start to pull away from the bowl, similarly the vitreous throughout our lives can begin to pull away from the retina. That process is called a posterior vitreous detachment. In some cases, the vitreous can tug hard enough on the retina to form a retinal tear. I discussed with the patient the signs and symptoms of a retinal detachment.  Do not rub the eye.  Controlled type 2 diabetes mellitus with stable proliferative retinopathy of both eyes, without long-term current use of insulin (HCC) The nature of regressed proliferative diabetic retinopathy was discussed with the patient. The patient was advised to maintain good glucose, blood pressure, monitor kidney function and serum lipid control as advised by personal physician. Rare risk for reactivation of progression exist with untreated severe anemia, untreated renal failure, untreated heart failure, and smoking. Complete avoidance of  smoking was recommended. The chance of recurrent proliferative diabetic retinopathy was discussed as well as the chance of vitreous hemorrhage for which further treatments may be necessary.   Explained to the patient that the quiescent  proliferative diabetic retinopathy disease is unlikely to ever worsen.  Worsening factors would include however severe anemia, hypertension out-of-control or impending renal failure.      ICD-10-CM   1. Controlled type 2 diabetes mellitus with stable proliferative retinopathy of both eyes, without long-term current use of insulin (HCC)  O70.9628 Color Fundus Photography [facility] - OU - Both Eyes  2. Pseudophakia, both eyes  Z96.1   3. Posterior vitreous detachment of both eyes  H43.813     1.  No active disease OU, will watch anterior retina OU and temporal window as this is the most likely area for progression of diabetic retinopathy in the future.  Currently stable.  2.  3.  Ophthalmic Meds Ordered this visit:  No orders of the defined types were placed in this encounter.      Return in about 9 months (around 04/14/2021) for DILATE OU, COLOR FP.  There are no Patient Instructions on file for this visit.   Explained the diagnoses, plan, and follow up with the patient and they expressed understanding.  Patient expressed understanding of the importance of proper follow up care.   Clent Demark Torri Michalski M.D. Diseases & Surgery of the Retina and Vitreous [facility]abetic [place] 07/15/20     Abbreviations: M myopia (nearsighted); A astigmatism; H hyperopia (farsighted); P presbyopia; Mrx spectacle prescription;  CTL contact lenses; OD right eye; OS left eye; OU both eyes  XT exotropia; ET esotropia; PEK punctate epithelial keratitis; PEE punctate epithelial erosions; DES dry eye syndrome; MGD meibomian gland dysfunction; ATs artificial tears; PFAT's preservative free artificial tears; Rew nuclear sclerotic cataract; PSC posterior subcapsular cataract; ERM  epi-retinal membrane; PVD posterior vitreous detachment; RD retinal detachment; DM diabetes mellitus; DR diabetic retinopathy; NPDR non-proliferative diabetic retinopathy; PDR proliferative diabetic retinopathy; CSME clinically significant macular edema; DME diabetic macular edema; dbh dot blot hemorrhages; CWS cotton wool spot; POAG primary open angle glaucoma; C/D cup-to-disc ratio; HVF humphrey visual field; GVF goldmann visual field; OCT optical coherence tomography; IOP intraocular pressure; BRVO Branch retinal vein occlusion; CRVO central retinal vein occlusion; CRAO central retinal artery occlusion; BRAO branch retinal artery occlusion; RT retinal tear; SB scleral buckle; PPV pars plana vitrectomy; VH Vitreous hemorrhage; PRP panretinal laser photocoagulation; IVK intravitreal kenalog; VMT vitreomacular traction; MH Macular hole;  NVD neovascularization of the disc; NVE neovascularization elsewhere; AREDS age related eye disease study; ARMD age related macular degeneration; POAG primary open angle glaucoma; EBMD epithelial/anterior basement membrane dystrophy; ACIOL anterior chamber intraocular lens; IOL intraocular lens; PCIOL posterior chamber intraocular lens; Phaco/IOL phacoemulsification with intraocular lens placement; [place] photorefractive keratectomy; LASIK laser assisted in situ keratomileusis; HTN hypertension; DM diabetes mellitus; COPD chronic obstructive pulmonary disease

## 2020-07-15 NOTE — Assessment & Plan Note (Signed)

## 2020-07-15 NOTE — Telephone Encounter (Signed)
Pt called and would like a rx of diclofenac sent [facility]

## 2020-08-05 NOTE — Telephone Encounter (Signed)
-----   Message from Lauree Chandler, NP sent at 08/05/2020 10:12 AM EST ----- Needs AWV, has had had since 2019

## 2020-08-05 NOTE — Telephone Encounter (Signed)
This encounter was created in error - please disregard.

## 2020-08-09 ENCOUNTER — Other Ambulatory Visit: Payer: Medicare Other

## 2020-08-12 ENCOUNTER — Ambulatory Visit: Payer: Medicare Other | Admitting: Internal Medicine

## 2020-08-18 ENCOUNTER — Telehealth: Payer: Self-pay

## 2020-08-18 NOTE — Telephone Encounter (Signed)
Patient did not want to schedule AWV and states she would discuss it iwith Dr. Mariea Clonts on her appointment

## 2020-10-06 ENCOUNTER — Other Ambulatory Visit: Payer: Medicare Other

## 2020-10-06 ENCOUNTER — Other Ambulatory Visit: Payer: Self-pay

## 2020-10-06 ENCOUNTER — Other Ambulatory Visit: Payer: Self-pay | Admitting: Internal Medicine

## 2020-10-06 DIAGNOSIS — E11319 Type 2 diabetes mellitus with unspecified diabetic retinopathy without macular edema: Secondary | ICD-10-CM | POA: Diagnosis not present

## 2020-10-06 DIAGNOSIS — E781 Pure hyperglyceridemia: Secondary | ICD-10-CM

## 2020-10-06 DIAGNOSIS — I10 Essential (primary) hypertension: Secondary | ICD-10-CM

## 2020-10-07 LAB — COMPLETE METABOLIC PANEL WITH GFR
AG Ratio: 1.4 (calc) (ref 1.0–2.5)
ALT: 7 U/L (ref 6–29)
AST: 12 U/L (ref 10–35)
Albumin: 4 g/dL (ref 3.6–5.1)
Alkaline phosphatase (APISO): 64 U/L (ref 37–153)
BUN/Creatinine Ratio: 15 (calc) (ref 6–22)
BUN: 24 mg/dL (ref 7–25)
CO2: 25 mmol/L (ref 20–32)
Calcium: 9.5 mg/dL (ref 8.6–10.4)
Chloride: 105 mmol/L (ref 98–110)
Creat: 1.63 mg/dL — ABNORMAL HIGH (ref 0.60–0.88)
GFR, Est African American: 34 mL/min/{1.73_m2} — ABNORMAL LOW (ref >=60)
GFR, Est Non African American: 29 mL/min/{1.73_m2} — ABNORMAL LOW (ref >=60)
Globulin: 2.8 g/dL (calc) (ref 1.9–3.7)
Glucose, Bld: 86 mg/dL (ref 65–99)
Potassium: 4.6 mmol/L (ref 3.5–5.3)
Sodium: 140 mmol/L (ref 135–146)
Total Bilirubin: 0.4 mg/dL (ref 0.2–1.2)
Total Protein: 6.8 g/dL (ref 6.1–8.1)

## 2020-10-07 LAB — LIPID PANEL
Cholesterol: 136 mg/dL (ref <200)
HDL: 47 mg/dL — ABNORMAL LOW (ref >=50)
LDL Cholesterol (Calc): 63 mg/dL (calc)
Non-HDL Cholesterol (Calc): 89 mg/dL (calc) (ref <130)
Total CHOL/HDL Ratio: 2.9 (calc) (ref <5.0)
Triglycerides: 193 mg/dL — ABNORMAL HIGH (ref <150)

## 2020-10-07 LAB — CBC WITH DIFFERENTIAL/PLATELET
Absolute Monocytes: 1197 cells/uL — ABNORMAL HIGH (ref 200–950)
Basophils Absolute: 53 cells/uL (ref 0–200)
Basophils Relative: 0.5 %
Eosinophils Absolute: 368 cells/uL (ref 15–500)
Eosinophils Relative: 3.5 %
HCT: 37.1 % (ref 35.0–45.0)
Hemoglobin: 12.3 g/dL (ref 11.7–15.5)
Lymphs Abs: 2426 cells/uL (ref 850–3900)
MCH: 30.4 pg (ref 27.0–33.0)
MCHC: 33.2 g/dL (ref 32.0–36.0)
MCV: 91.6 fL (ref 80.0–100.0)
MPV: 10.3 fL (ref 7.5–12.5)
Monocytes Relative: 11.4 %
Neutro Abs: 6458 cells/uL (ref 1500–7800)
Neutrophils Relative %: 61.5 %
Platelets: 279 10*3/uL (ref 140–400)
RBC: 4.05 10*6/uL (ref 3.80–5.10)
RDW: 12.5 % (ref 11.0–15.0)
Total Lymphocyte: 23.1 %
WBC: 10.5 10*3/uL (ref 3.8–10.8)

## 2020-10-07 LAB — HEMOGLOBIN A1C
Hgb A1c MFr Bld: 7.3 % of total Hgb — ABNORMAL HIGH (ref <5.7)
Mean Plasma Glucose: 163 mg/dL
eAG (mmol/L): 9 mmol/L

## 2020-10-07 NOTE — Telephone Encounter (Signed)
Patient has requested a refill on medication "Furosemide 20 mg". Patient last refill was 04/07/2020 by Pete Pelt, PA-C. Medication pend and sent to PCP Gayland Curry, DO for approval.

## 2020-10-07 NOTE — Progress Notes (Signed)
We'll discuss at her visit: Bad cholesterol is at goal Starchy fats still elevated Sugar average is back up some again Kidney function a little worse--?NSAIDs and adequate hydration.

## 2020-10-11 ENCOUNTER — Other Ambulatory Visit: Payer: Self-pay

## 2020-10-11 ENCOUNTER — Ambulatory Visit (INDEPENDENT_AMBULATORY_CARE_PROVIDER_SITE_OTHER): Payer: Medicare Other | Admitting: Internal Medicine

## 2020-10-11 ENCOUNTER — Encounter: Payer: Self-pay | Admitting: Internal Medicine

## 2020-10-11 VITALS — BP 110/68 | HR 95 | Temp 97.8°F

## 2020-10-11 DIAGNOSIS — I951 Orthostatic hypotension: Secondary | ICD-10-CM | POA: Diagnosis not present

## 2020-10-11 DIAGNOSIS — M1731 Unilateral post-traumatic osteoarthritis, right knee: Secondary | ICD-10-CM | POA: Diagnosis not present

## 2020-10-11 DIAGNOSIS — E113593 Type 2 diabetes mellitus with proliferative diabetic retinopathy without macular edema, bilateral: Secondary | ICD-10-CM | POA: Diagnosis not present

## 2020-10-11 DIAGNOSIS — F411 Generalized anxiety disorder: Secondary | ICD-10-CM

## 2020-10-11 DIAGNOSIS — K219 Gastro-esophageal reflux disease without esophagitis: Secondary | ICD-10-CM

## 2020-10-11 DIAGNOSIS — I10 Essential (primary) hypertension: Secondary | ICD-10-CM | POA: Diagnosis not present

## 2020-10-11 DIAGNOSIS — E781 Pure hyperglyceridemia: Secondary | ICD-10-CM | POA: Diagnosis not present

## 2020-10-11 NOTE — Patient Instructions (Signed)
Please go get your second covid vaccine and then get a booster

## 2020-10-11 NOTE — Progress Notes (Signed)
This service is provided via telemedicine  No vital signs collected/recorded due to the encounter was a telemedicine visit.   Location of patient (ex: home, work): home  Patient consents to a telephone visit: yes  Location of the provider (ex: office, home):  [place]   Name of any referring provider: Hollace Kinnier DO   Names of all persons participating in the telemedicine service and their role in the encounter: Dr. Mariea Clonts DO, Julie Baxter (patient) and Charlene CMA   Time spent on call: 10 mins by CMA    Provider:  Dylann Layne L. Mariea Clonts, D.O., C.M.D.  Code Status: DNR Goals of Care:  Advanced Directives 10/11/2020  Does Patient Have a Medical Advance Directive? Yes  Type of Advance Directive Out of facility DNR (pink MOST or yellow form)  Does patient want to make changes to medical advance directive? No - Patient declined  Copy of [place] in Chart? -  Would patient like information on creating a medical advance directive? -  Pre-existing out of facility DNR order (yellow form or pink MOST form) -     Chief Complaint  Patient presents with  . Medical Management of Chronic Issues    6 Month follow up.... discuss labs     HPI: Patient is a 85 y.o. female seen today for medical management of chronic diseases.    She tells me she was a mess the last time she was here.    She drank 7 cups of water this am to keep her bp up.  She is wearing compression stockings.  HR 90s.  BP 110/68.  She has a cane for back up.  She still furniture surfs some though anyway.    185.7 lbs at home today naked.  She was shocked that her sugar average was high, but she notes she has been having potato chips and cheese doodles to elevate her blood sugar.  She sent them out the door with her grandchildren so she won't have them in the house.  She also was buying some kind of cakes in the store.  Sugar average was 7.3 up from 7.  Admits she'd hardly been eating real foods.    She asks  about high protein items when I suggested that.  Has a pecan tree so has some of them.      LDL is at goal.    She hasn't had her second covid shot.  Keeps saying how she doesn't get exposed.  Uses his sanitizer and wears her mask.  She's a little scared to take it.   Kidney function oscillates--was a little worse again this time.  She admits sometimes she did not drink water al day, only 3 mugs of coffee.  Had a gel shot in her knee 5 mos ago.  Not bothering her lately.  Spaces out her chores.   Advised to avoid tylenol pm.  Melatonin is ok occasionally.  Past Medical History:  Diagnosis Date  . Anxiety    regarding surgery; occassionally pt. states  . Anxiety state, unspecified   . Arthritis   . Asymptomatic varicose veins   . Chronic kidney disease   . Diabetic retinopathy ([place])   . Edema   . Hard of hearing   . Hyperpotassemia   . Hypertension   . Inflammation of joint of knee    right knee  . Insomnia, unspecified   . Multiple thyroid nodules   . Osteoarthrosis, unspecified whether generalized or localized, unspecified site   .  Other abnormal blood chemistry   . Other and unspecified hyperlipidemia   . Other malaise and fatigue   . Other specified disease of nail   . Overweight(278.02)   . Pain in joint, lower leg   . Pain in joint, pelvic region and thigh   . Peripheral vascular disease ([place])    varicose veins-  "Blood Clot anterior left LEG WITH PREGNANCY"  . PONV (postoperative nausea and vomiting)    pt. states that she was sick after hip surgery and oral surgery  . Type II or unspecified type diabetes mellitus with renal manifestations, not stated as uncontrolled(250.40)   . Undiagnosed cardiac murmurs   . Unspecified disorder of kidney and ureter   . Unspecified disorder of kidney and ureter   . Urinary tract infection, site not specified     Past Surgical History:  Procedure Laterality Date  . ABDOMINAL HYSTERECTOMY    . EYE SURGERY Bilateral     cataract extraction with IOL  . LIPOMA EXCISION     x2  bilateral buttocks  . spinal meningitis  1957  . TONSILLECTOMY    . TOTAL HIP ARTHROPLASTY Right 11/29/2012   Procedure: RIGHT TOTAL HIP ARTHROPLASTY ANTERIOR APPROACH;  Surgeon: Mcarthur Rossetti, MD;  Location: [facility];  Service: Orthopedics;  Laterality: Right;  . TOTAL HIP ARTHROPLASTY Left 06/01/2015   Procedure: LEFT TOTAL HIP ARTHROPLASTY ANTERIOR APPROACH;  Surgeon: Mcarthur Rossetti, MD;  Location: [facility];  Service: Orthopedics;  Laterality: Left;    Allergies  Allergen Reactions  . Elemental Sulfur Hives  . Naproxen Swelling and Other (See Comments)    Blacked out - Face swelling   . Tramadol Nausea And Vomiting    Outpatient Encounter Medications as of 10/11/2020  Medication Sig  . ACCU-CHEK FASTCLIX LANCETS MISC Test blood sugar twice daily DX E08.319  . ACCU-CHEK SMARTVIEW test strip USE TO CHECK BLOOD SUGAR  TWO TIMES DAILY  . aspirin EC 81 MG tablet Take 81 mg by mouth daily.  Marland Kitchen glipiZIDE (GLUCOTROL) 5 MG tablet TAKE 1 TABLET BY MOUTH   TWICE A DAY BEFORE MEALS   (BREAKFAST AND LUNCH)  . metFORMIN (GLUCOPHAGE) 500 MG tablet TAKE 2 TABLETS BY MOUTH  TWICE DAILY WITH MEALS  . omeprazole (PRILOSEC OTC) 20 MG tablet Take 1 tablet (20 mg total) by mouth daily before supper.  . Propylene Glycol (SYSTANE BALANCE OP) Apply 1 drop to eye daily as needed (dry eyes).  . simvastatin (ZOCOR) 40 MG tablet TAKE 1 TABLET BY MOUTH  DAILY  . [DISCONTINUED] furosemide (LASIX) 20 MG tablet TAKE 1 TABLET BY MOUTH  DAILY FOR BLOOD PRESSURE  AND EDEMA   No facility-administered encounter medications on file as of 10/11/2020.    Review of Systems:  Review of Systems  Constitutional: Positive for weight loss. Negative for chills, fever and malaise/fatigue.  HENT: Negative for congestion and sore throat.   Eyes: Negative for blurred vision.  Respiratory: Negative for cough and shortness of breath.   Cardiovascular: Negative for  chest pain, palpitations and leg swelling.  Gastrointestinal: Negative for abdominal pain and constipation.  Genitourinary: Negative for dysuria.  Musculoskeletal: Negative for back pain, falls and joint pain.  Neurological: Negative for dizziness and loss of consciousness.  Endo/Heme/Allergies: Bruises/bleeds easily.  Psychiatric/Behavioral: Negative for depression and memory loss. The patient is nervous/anxious. The patient does not have insomnia.     Health Maintenance  Topic Date Due  . COVID-19 Vaccine (1) Never done  . OPHTHALMOLOGY EXAM  10/23/2018  . INFLUENZA VACCINE  03/28/2020  . FOOT EXAM  06/18/2020  . DEXA SCAN  08/29/2023 (Originally 04/12/2003)  . HEMOGLOBIN A1C  04/05/2021  . PNA vac Low Risk Adult  Completed    Physical Exam: Could not be performed as visit non face-to-face via phone   Labs reviewed: Basic Metabolic Panel: Recent Labs    02/09/20 0816 10/06/20 0828  NA 139 140  K 4.5 4.6  CL 105 105  CO2 24 25  GLUCOSE 109* 86  BUN 20 24  CREATININE 1.46* 1.63*  CALCIUM 9.4 9.5   Liver Function Tests: Recent Labs    02/09/20 0816 10/06/20 0828  AST 13 12  ALT 10 7  BILITOT 0.5 0.4  PROT 6.6 6.8   No results for input(s): LIPASE, AMYLASE in the last 8760 hours. No results for input(s): AMMONIA in the last 8760 hours. CBC: Recent Labs    02/09/20 0816 10/06/20 0828  WBC 8.6 10.5  NEUTROABS 5,126 6,458  HGB 11.9 12.3  HCT 35.2 37.1  MCV 91.0 91.6  PLT 271 279   Lipid Panel: Recent Labs    02/09/20 0816 10/06/20 0828  CHOL 134 136  HDL 52 47*  LDLCALC 59 63  TRIG 149 193*  CHOLHDL 2.6 2.9   Lab Results  Component Value Date   HGBA1C 7.3 (H) 10/06/2020    Procedures since last visit: No results found.  Assessment/Plan 1. Type 2 diabetes mellitus with proliferative retinopathy of both eyes, without long-term current use of insulin, macular edema presence unspecified, unspecified proliferative retinopathy type  ([place]) -control is good considering her decreased mobility over the years  -cont metformin, glipizide -no recent low glucose--has to be 140+ at hs or she drops, keeps glucose tabs at bedside in case  2. Essential hypertension -no longer on medications due to orthostatic hypotension  3. Hypertriglyceridemia -continue to work on diet--talked about this extensively today  4. Orthostatic hypotension -remains an issue when she does not hydrate adequately, must change positions slowly and is wearing her compression stockings -may need midodrine if persists -advised to drink 8 glasses of water per day  5. Generalized anxiety disorder -remains an anxious person, worries about her husband's health a lot   6. Gastroesophageal reflux disease, unspecified whether esophagitis present -cont prilosec therapy, no related concerns today with this  7. Post-traumatic osteoarthritis of right knee -doing better after cartilage injection, but she's less active  Labs/tests ordered:   Lab Orders     CBC with Differential/Platelet     COMPLETE METABOLIC PANEL WITH GFR     Lipid panel     Hemoglobin A1c Also advised to complete covid vaccine series and she says she will  Next appt:  6 mos Non face-to-face time spent on televisit:  21 mins  Julie Baxter, D.O. [facility] [address], [place] [address] Cell Phone (Mon-Fri 8am-5pm):  [phone] On Call:  [phone] & follow prompts after 5pm & weekends Office Phone:  [phone] Office Fax:  [phone]

## 2020-10-18 ENCOUNTER — Encounter: Payer: Self-pay | Admitting: Internal Medicine

## 2020-11-01 ENCOUNTER — Telehealth: Payer: Self-pay | Admitting: Nurse Practitioner

## 2020-11-01 NOTE — Telephone Encounter (Signed)
Patient did not want to schedule and would not like another phone call in regards to it. She said she would call in and schedule when ready.

## 2020-12-06 ENCOUNTER — Telehealth: Payer: Self-pay

## 2020-12-06 NOTE — Telephone Encounter (Signed)
Noted  

## 2020-12-06 NOTE — Telephone Encounter (Signed)
Patient called she would like to get pre approval process started for gel injections in both of her knees if possible, last injection received was 04/19/2020 call [phone] ** patient stated to lvm if she doesn't answer.

## 2021-01-11 ENCOUNTER — Telehealth: Payer: Self-pay

## 2021-01-11 NOTE — Telephone Encounter (Signed)
VOB has been submitted for Durolane, bilateral knee. Pending BV. 

## 2021-01-14 ENCOUNTER — Telehealth: Payer: Self-pay

## 2021-01-14 NOTE — Telephone Encounter (Signed)
Called and left a VM for patient to CB to schedule an appointment with Dr. Ninfa Linden.  Approved for Durolane, bilateral knee. [place] Patient will be responsible for 20% OOP. Co-pay of $35.00 No PA required

## 2021-01-20 ENCOUNTER — Telehealth: Payer: Self-pay | Admitting: *Deleted

## 2021-01-20 MED ORDER — METFORMIN HCL 500 MG PO TABS
2.0000 | ORAL_TABLET | Freq: Two times a day (BID) | ORAL | 1 refills | Status: DC
Start: 2021-01-20 — End: 2021-07-08

## 2021-01-20 MED ORDER — SIMVASTATIN 40 MG PO TABS: 40.0000 mg | ORAL_TABLET | Freq: Every day | ORAL | 1 refills | Status: DC

## 2021-01-20 MED ORDER — GLIPIZIDE 5 MG PO TABS: ORAL_TABLET | ORAL | 1 refills | Status: DC

## 2021-01-20 NOTE — Telephone Encounter (Signed)
Received refill requests from [facility]. PCP was still listed under Dr. Mariea Clonts and no upcoming appointment.   I called patient to confirm PCP and she would like to stay with [facility].  Appointment scheduled for 05/04/2021 for follow up.  Rx's sent to pharmacy.

## 2021-01-25 ENCOUNTER — Other Ambulatory Visit: Payer: Self-pay | Admitting: *Deleted

## 2021-01-25 MED ORDER — ACCU-CHEK SMARTVIEW VI STRP: ORAL_STRIP | 3 refills | Status: DC

## 2021-01-25 NOTE — Telephone Encounter (Signed)
[facility] requested.

## 2021-01-26 ENCOUNTER — Ambulatory Visit: Payer: Medicare Other | Admitting: Physician Assistant

## 2021-01-31 ENCOUNTER — Encounter: Payer: Self-pay | Admitting: Physician Assistant

## 2021-01-31 ENCOUNTER — Other Ambulatory Visit: Payer: Self-pay

## 2021-01-31 ENCOUNTER — Ambulatory Visit: Payer: Medicare Other | Admitting: Physician Assistant

## 2021-01-31 DIAGNOSIS — M1712 Unilateral primary osteoarthritis, left knee: Secondary | ICD-10-CM | POA: Diagnosis not present

## 2021-01-31 DIAGNOSIS — M1711 Unilateral primary osteoarthritis, right knee: Secondary | ICD-10-CM | POA: Diagnosis not present

## 2021-01-31 MED ORDER — LIDOCAINE HCL 1 % IJ SOLN
5.0000 mL | INTRAMUSCULAR | Status: AC | PRN
  Administered 2021-01-31: 5 mL

## 2021-01-31 MED ORDER — SODIUM HYALURONATE 60 MG/3ML IX PRSY
60.0000 mg | PREFILLED_SYRINGE | INTRA_ARTICULAR | Status: AC | PRN
  Administered 2021-01-31: 60 mg via INTRA_ARTICULAR

## 2021-01-31 NOTE — Progress Notes (Signed)
   Procedure Note  Patient: Julie Baxter             Date of Birth: 11/02/1935           MRN: [id number]             Visit Date: 01/31/2021 Julie Baxter is diagnosed with osteoarthritis of both knees.    Radiographs show evidence of joint space narrowing, osteophytes, subchondral sclerosis and/or subchondral cysts.  This patient has knee pain which interferes with functional and activities of daily living.   This patient has experienced inadequate response, adverse effects and/or intolerance with conservative treatments such as acetaminophen, NSAIDS, topical creams, physical therapy or regular exercise, knee bracing and/or weight loss.   This patient is not scheduled to have a total knee replacement within 6 months of starting treatment with viscosupplementation.  Procedures: Visit Diagnoses:  1. Primary osteoarthritis of right knee   2. Primary osteoarthritis of left knee     Large Joint Inj: bilateral knee on 01/31/2021 12:03 PM Indications: pain Details: 22 G 1.5 in needle, superior approach  Arthrogram: No  Medications (Right): 5 mL lidocaine 1 %; 60 mg Sodium Hyaluronate 60 MG/3ML Aspirate (Right): 16 mL yellow Medications (Left): 5 mL lidocaine 1 %; 60 mg Sodium Hyaluronate 60 MG/3ML Outcome: tolerated well, no immediate complications Procedure, treatment alternatives, risks and benefits explained, specific risks discussed. Consent was given by the patient. Immediately prior to procedure a time out was called to verify the correct patient, procedure, equipment, support staff and site/side marked as required. Patient was prepped and draped in the usual sterile fashion.     Plan: Julie Baxter will follow-up with [place] as needed she knows to lately 6 months between supplemental injections.  Questions encouraged and answered at length.

## 2021-02-14 ENCOUNTER — Other Ambulatory Visit: Payer: Self-pay | Admitting: Orthopaedic Surgery

## 2021-02-14 ENCOUNTER — Other Ambulatory Visit: Payer: Self-pay

## 2021-02-14 ENCOUNTER — Telehealth: Payer: Self-pay

## 2021-02-14 MED ORDER — DICLOFENAC SODIUM 1 % EX GEL: 2.0000 g | Freq: Four times a day (QID) | CUTANEOUS | 1 refills | Status: DC | PRN

## 2021-02-14 NOTE — Telephone Encounter (Signed)
Sent Rx voltaren gel

## 2021-02-14 NOTE — Telephone Encounter (Signed)
Patient called she is requesting a rx refill for diclofenac to be sent to  [facility]  ([place], [address], [address]  call [phone]

## 2021-03-26 ENCOUNTER — Emergency Department (HOSPITAL_COMMUNITY): Payer: Medicare Other

## 2021-03-26 ENCOUNTER — Other Ambulatory Visit: Payer: Self-pay

## 2021-03-26 ENCOUNTER — Emergency Department (HOSPITAL_COMMUNITY)
Admission: EM | Admit: 2021-03-26 | Discharge: 2021-03-26 | Disposition: A | Discharge status: Home / Self Care | Payer: Medicare Other | Attending: Emergency Medicine | Admitting: Emergency Medicine

## 2021-03-26 ENCOUNTER — Encounter (HOSPITAL_COMMUNITY): Payer: Self-pay | Admitting: Emergency Medicine

## 2021-03-26 DIAGNOSIS — I129 Hypertensive chronic kidney disease with stage 1 through stage 4 chronic kidney disease, or unspecified chronic kidney disease: Secondary | ICD-10-CM | POA: Diagnosis not present

## 2021-03-26 DIAGNOSIS — E1122 Type 2 diabetes mellitus with diabetic chronic kidney disease: Secondary | ICD-10-CM | POA: Insufficient documentation

## 2021-03-26 DIAGNOSIS — Z7984 Long term (current) use of oral hypoglycemic drugs: Secondary | ICD-10-CM | POA: Diagnosis not present

## 2021-03-26 DIAGNOSIS — Y92009 Unspecified place in unspecified non-institutional (private) residence as the place of occurrence of the external cause: Secondary | ICD-10-CM | POA: Diagnosis not present

## 2021-03-26 DIAGNOSIS — W19XXXA Unspecified fall, initial encounter: Secondary | ICD-10-CM

## 2021-03-26 DIAGNOSIS — S42342A Displaced spiral fracture of shaft of humerus, left arm, initial encounter for closed fracture: Secondary | ICD-10-CM

## 2021-03-26 DIAGNOSIS — W01198A Fall on same level from slipping, tripping and stumbling with subsequent striking against other object, initial encounter: Secondary | ICD-10-CM | POA: Diagnosis not present

## 2021-03-26 DIAGNOSIS — E113593 Type 2 diabetes mellitus with proliferative diabetic retinopathy without macular edema, bilateral: Secondary | ICD-10-CM | POA: Insufficient documentation

## 2021-03-26 DIAGNOSIS — Z96643 Presence of artificial hip joint, bilateral: Secondary | ICD-10-CM | POA: Diagnosis not present

## 2021-03-26 DIAGNOSIS — M25512 Pain in left shoulder: Secondary | ICD-10-CM | POA: Diagnosis not present

## 2021-03-26 DIAGNOSIS — N189 Chronic kidney disease, unspecified: Secondary | ICD-10-CM | POA: Diagnosis not present

## 2021-03-26 DIAGNOSIS — Z7982 Long term (current) use of aspirin: Secondary | ICD-10-CM | POA: Diagnosis not present

## 2021-03-26 DIAGNOSIS — S4992XA Unspecified injury of left shoulder and upper arm, initial encounter: Secondary | ICD-10-CM | POA: Diagnosis present

## 2021-03-26 MED ORDER — OXYCODONE HCL 5 MG PO TABS: 5.0000 mg | ORAL_TABLET | ORAL | 0 refills | Status: DC | PRN

## 2021-03-26 MED ORDER — ONDANSETRON 8 MG PO TBDP
8.0000 mg | ORAL_TABLET | Freq: Once | ORAL | Status: AC
  Administered 2021-03-26: 8 mg via ORAL
  Filled 2021-03-26: qty 1

## 2021-03-26 MED ORDER — OXYCODONE-ACETAMINOPHEN 5-325 MG PO TABS
1.0000 | ORAL_TABLET | Freq: Once | ORAL | Status: AC
  Administered 2021-03-26: 1 via ORAL
  Filled 2021-03-26: qty 1

## 2021-03-26 MED ORDER — ACETAMINOPHEN 325 MG PO TABS: 650.0000 mg | ORAL_TABLET | Freq: Once | ORAL | Status: DC

## 2021-03-26 MED ORDER — ONDANSETRON HCL 4 MG PO TABS: 4.0000 mg | ORAL_TABLET | Freq: Four times a day (QID) | ORAL | 0 refills | Status: DC | PRN

## 2021-03-26 NOTE — ED Notes (Signed)
Ortho tech was called to come and apply splint.

## 2021-03-26 NOTE — Progress Notes (Signed)
Orthopedic Tech Progress Note Patient Details:  Julie Baxter 05-17-1936 [id number]  Ortho Devices Type of Ortho Device: Post (long arm) splint, Coapt Ortho Device/Splint Location: LUE Ortho Device/Splint Interventions: Ordered, Application, Adjustment   Post Interventions Patient Tolerated: Fair Instructions Provided: Care of device, Poper ambulation with device  Sundra Haddix 03/26/2021, 6:52 AM

## 2021-03-26 NOTE — ED Triage Notes (Signed)
Patient presents after falling at home. Patient states she hit her left upper arm and shoulder and her arm "flopped" when she tried to get up. Patient put in sling for comfort.

## 2021-03-26 NOTE — ED Provider Notes (Signed)
[facility] DEPT Provider Note   CSN: [id number] Arrival date & time: 03/26/21  0357     History Chief Complaint  Patient presents with   Fall   Arm Injury    Julie Baxter is a 85 y.o. female.  The history is provided by the patient.  Fall  Arm Injury She has history of hypertension, diabetes and comes in following a fall at home.  She has a history of orthostatic hypotension and she got up to go to bed and she got lightheaded and fell against a refrigerator injuring her left arm.  She noted that the arm was flopping.  She denies other injury.  She specifically denies head, neck, back injury.  She is not on any anticoagulants.   Past Medical History:  Diagnosis Date   Anxiety    regarding surgery; occassionally pt. states   Anxiety state, unspecified    Arthritis    Asymptomatic varicose veins    Chronic kidney disease    Diabetic retinopathy ([place])    Edema    Hard of hearing    Hyperpotassemia    Hypertension    Inflammation of joint of knee    right knee   Insomnia, unspecified    Multiple thyroid nodules    Osteoarthrosis, unspecified whether generalized or localized, unspecified site    Other abnormal blood chemistry    Other and unspecified hyperlipidemia    Other malaise and fatigue    Other specified disease of nail    Overweight(278.02)    Pain in joint, lower leg    Pain in joint, pelvic region and thigh    Peripheral vascular disease ([place])    varicose veins-  "Blood Clot anterior left LEG WITH PREGNANCY"   PONV (postoperative nausea and vomiting)    pt. states that she was sick after hip surgery and oral surgery   Type II or unspecified type diabetes mellitus with renal manifestations, not stated as uncontrolled(250.40)    Undiagnosed cardiac murmurs    Unspecified disorder of kidney and ureter    Unspecified disorder of kidney and ureter    Urinary tract infection, site not specified     Patient Active Problem  List   Diagnosis Date Noted   Posterior vitreous detachment of both eyes 07/15/2020   Controlled type 2 diabetes mellitus with stable proliferative retinopathy of both eyes, without long-term current use of insulin ([place]) 07/15/2020   Pseudophakia, both eyes 07/15/2020   Orthostatic hypotension 12/12/2018   Advance care planning 09/13/2017   Orthostatic dizziness 12/16/2015   Diabetes mellitus due to underlying condition with retinopathy ([place]) 12/16/2015   Hypertriglyceridemia 12/16/2015   Post-traumatic osteoarthritis of right knee 12/16/2015   Essential hypertension 12/16/2015   Osteoarthritis of left hip 06/01/2015   Status post total replacement of left hip 06/01/2015   Dizzy spells 12/09/2014   Cystic thyroid nodule 12/09/2014   Generalized anxiety disorder 11/25/2014   Diabetic retinopathy ([place])    Other and unspecified hyperlipidemia    Degenerative arthritis of hip 11/29/2012    Past Surgical History:  Procedure Laterality Date   ABDOMINAL HYSTERECTOMY     EYE SURGERY Bilateral    cataract extraction with IOL   LIPOMA EXCISION     x2  bilateral buttocks   spinal meningitis  1957   TONSILLECTOMY     TOTAL HIP ARTHROPLASTY Right 11/29/2012   Procedure: RIGHT TOTAL HIP ARTHROPLASTY ANTERIOR APPROACH;  Surgeon: Mcarthur Rossetti, MD;  Location: [facility];  Service:  Orthopedics;  Laterality: Right;   TOTAL HIP ARTHROPLASTY Left 06/01/2015   Procedure: LEFT TOTAL HIP ARTHROPLASTY ANTERIOR APPROACH;  Surgeon: Mcarthur Rossetti, MD;  Location: [facility];  Service: Orthopedics;  Laterality: Left;     OB History   No obstetric history on file.     No family history on file.  Social History   Tobacco Use   Smoking status: Never   Smokeless tobacco: Never  Vaping Use   Vaping Use: Never used  Substance Use Topics   Alcohol use: No   Drug use: No    Home Medications Prior to Admission medications   Medication Sig Start Date End Date Taking? Authorizing Provider   ACCU-CHEK FASTCLIX LANCETS MISC Test blood sugar twice daily DX E08.319 09/06/16   Reed, Tiffany L, DO  aspirin EC 81 MG tablet Take 81 mg by mouth daily.    [provider]  diclofenac Sodium (VOLTAREN) 1 % GEL Apply 2 g topically 4 (four) times daily as needed. 02/14/21   Mcarthur Rossetti, MD  glipiZIDE (GLUCOTROL) 5 MG tablet Take one tablet by mouth twice daily before meals (breakfast and lunch) 01/20/21   Lauree Chandler, NP  glucose blood (ACCU-CHEK SMARTVIEW) test strip USE TO CHECK BLOOD SUGAR  TWO TIMES DAILY Dx:E11.3593 01/25/21   Lauree Chandler, NP  metFORMIN (GLUCOPHAGE) 500 MG tablet Take 2 tablets (1,000 mg total) by mouth 2 (two) times daily with a meal. 01/20/21   Lauree Chandler, NP  omeprazole (PRILOSEC OTC) 20 MG tablet Take 1 tablet (20 mg total) by mouth daily before supper. 06/19/19   Reed, Tiffany L, DO  Propylene Glycol (SYSTANE BALANCE OP) Apply 1 drop to eye daily as needed (dry eyes).    [provider]  simvastatin (ZOCOR) 40 MG tablet Take 1 tablet (40 mg total) by mouth daily. 01/20/21   Lauree Chandler, NP    Allergies    Elemental sulfur, Naproxen, and Tramadol  Review of Systems   Review of Systems  All other systems reviewed and are negative.  Physical Exam Updated Vital Signs BP 131/82 (BP Location: Right Arm)   Pulse (!) 113   Temp 98.4 F (36.9 C) (Oral)   Resp 18   Ht 5\' 4"  (1.626 m)   Wt 90.7 kg   SpO2 97%   BMI 34.33 kg/m   Physical Exam Vitals and nursing note reviewed.  85 year old female, resting comfortably and in no acute distress. Vital signs are significant for elevated heart rate. Oxygen saturation is 97%, which is normal. Head is normocephalic and atraumatic. PERRLA, EOMI. Oropharynx is clear. Neck is nontender and supple without adenopathy or JVD. Back is nontender and there is no CVA tenderness. Lungs are clear without rales, wheezes, or rhonchi. Chest is nontender. Heart has regular rate and  rhythm without murmur. Abdomen is soft, flat, nontender without masses or hepatosplenomegaly and peristalsis is normoactive. Extremities: There is swelling and deformity of the left upper arm which is grossly unstable.  Distal neurovascular exam is intact with strong radial pulse, prompt capillary refill, normal sensation, normal motor function.  Remainder of extremity exam is normal. Skin is warm and dry without rash. Neurologic: Mental status is normal, cranial nerves are intact, moves all extremities equally.  ED Results / Procedures / Treatments    Radiology DG Shoulder Left  Result Date: 03/26/2021 CLINICAL DATA:  85 year old female status post fall at home. EXAM: LEFT SHOULDER - 2+ VIEW; LEFT HUMERUS - 2+  VIEW COMPARISON:  Chest radiographs 11/18/2012. FINDINGS: Spiral fracture of the proximal humerus shaft. The fracture is anteriorly angulated by 17 degrees, and overriding by at least 18 mm. Slight lateral displacement and medial angulation. No glenohumeral joint dislocation. Left clavicle and scapula appear intact with advanced degeneration at the left [facility] joint. Maintained alignment at the left elbow. Visible left ribs appear intact. IMPRESSION: Angulated and overriding spiral fracture of the proximal left humerus shaft. Electronically Signed   By: Genevie Ann M.D.   On: 03/26/2021 05:28   DG Humerus Left  Result Date: 03/26/2021 CLINICAL DATA:  85 year old female status post fall at home. EXAM: LEFT SHOULDER - 2+ VIEW; LEFT HUMERUS - 2+ VIEW COMPARISON:  Chest radiographs 11/18/2012. FINDINGS: Spiral fracture of the proximal humerus shaft. The fracture is anteriorly angulated by 17 degrees, and overriding by at least 18 mm. Slight lateral displacement and medial angulation. No glenohumeral joint dislocation. Left clavicle and scapula appear intact with advanced degeneration at the left [facility] joint. Maintained alignment at the left elbow. Visible left ribs appear intact. IMPRESSION: Angulated and  overriding spiral fracture of the proximal left humerus shaft. Electronically Signed   By: Genevie Ann M.D.   On: 03/26/2021 05:28    Procedures .Splint Application  Date/Time: 03/26/2021 6:30 AM Performed by: Delora Fuel, MD Authorized by: Delora Fuel, MD   Consent:    Consent obtained:  Verbal   Consent given by:  Patient   Risks discussed:  Pain   Alternatives discussed:  Alternative treatment Universal protocol:    Procedure explained and questions answered to patient or proxy's satisfaction: yes     Relevant documents present and verified: yes     Test results available: yes     Imaging studies available: yes     Required blood products, implants, devices, and special equipment available: yes     Site/side marked: yes     Immediately prior to procedure a time out was called: yes     Patient identity confirmed:  Verbally with patient and arm band Pre-procedure details:    Distal neurologic exam:  Normal   Distal perfusion: distal pulses strong and brisk capillary refill   Procedure details:    Location:  Arm   Arm location:  L upper arm   Strapping: no     Splint type:  Long arm (Posterior splint plus sugar-tong splint applied to the upper arm)   Supplies:  Fiberglass, elastic bandage and sling   Attestation: Splint applied and adjusted personally by me   Post-procedure details:    Distal neurologic exam:  Normal   Distal perfusion: unchanged     Procedure completion:  Tolerated well, no immediate complications   Post-procedure imaging: not applicable   Comments:     Splint applied with the assistance of orthopedic technician.   Medications Ordered in ED Medications  oxyCODONE-acetaminophen (PERCOCET/ROXICET) 5-325 MG per tablet 1 tablet (1 tablet Oral Given 03/26/21 0542)  ondansetron (ZOFRAN-ODT) disintegrating tablet 8 mg (8 mg Oral Given 03/26/21 0542)    ED Course  I have reviewed the triage vital signs and the nursing notes.  Pertinent labs & imaging results  that were available during my care of the patient were reviewed by me and considered in my medical decision making (see chart for details).   MDM Rules/Calculators/A&P                         Fall with injury to left upper  arm.  X-ray shows fracture of the shaft of the humerus.  Case is discussed with Dr. Lorin Mercy who request patient be placed in a long-arm splint with a sugar-tong splint for the upper arm.  This is applied with the assistance of orthopedic technician.  She is she is discharged with a prescription for oxycodone.  She has had nausea when she has taken tramadol in the past, so she is also given a prescription for ondansetron.  She is to follow-up with orthopedics, call for appointment on Monday, August 1.  Final Clinical Impression(s) / ED Diagnoses Final diagnoses:  Closed displaced spiral fracture of shaft of left humerus, initial encounter    Rx / DC Orders ED Discharge Orders          Ordered    oxyCODONE (ROXICODONE) 5 MG immediate release tablet  Every 4 hours PRN        03/26/21 0655    ondansetron (ZOFRAN) 4 MG tablet  Every 6 hours PRN        03/26/21 0223             Delora Fuel, MD 36/12/24 0700

## 2021-03-26 NOTE — Discharge Instructions (Addendum)
If you do need additional pain relief, you may take acetaminophen along with the oxycodone tablets.  Apply ice for 30 minutes at a time, 4 times a day.

## 2021-03-29 ENCOUNTER — Ambulatory Visit: Payer: Medicare Other | Admitting: Orthopaedic Surgery

## 2021-03-29 ENCOUNTER — Encounter: Payer: Self-pay | Admitting: Orthopaedic Surgery

## 2021-03-29 ENCOUNTER — Ambulatory Visit: Payer: Self-pay

## 2021-03-29 ENCOUNTER — Other Ambulatory Visit: Payer: Self-pay

## 2021-03-29 VITALS — BP 141/72 | HR 108 | Ht 64.0 in | Wt 200.0 lb

## 2021-03-29 DIAGNOSIS — M79602 Pain in left arm: Secondary | ICD-10-CM | POA: Diagnosis not present

## 2021-03-29 NOTE — Progress Notes (Signed)
Office Visit Note   Patient: Julie Baxter           Date of Birth: [birth year]           MRN: [id number] Visit Date: 03/29/2021              Requested by: Lauree Chandler, NP [place],  [place] [id number] PCP: Lauree Chandler, NP   Assessment & Plan: Visit Diagnoses:  1. Left arm pain     Plan: Continue coaptation splint.  She is using Tylenol for pain.  She has some Percocet if needed but states this tends to make her feel woozy and she is worried about falling taking it.  Plan Sarmiento application on return in 2 weeks followed by x-rays after Sarmiento application.  Follow-Up Instructions: No follow-ups on file.   Orders:  Orders Placed This Encounter  Procedures   XR Humerus Left   No orders of the defined types were placed in this encounter.     Procedures: No procedures performed   Clinical Data: No additional findings.   Subjective: Chief Complaint  Patient presents with   Left Upper Arm - Fracture    Fall 03/26/2021    HPI 85 year old female with history of orthostatic hypotension got up after sitting on sofa was walking through the kitchen got lightheaded and fell against a refrigerator with left humeral shaft fracture.  Spiral fracture placed in a coaptation splint with sling.  States she is having minimal pain.  No past history of injury to the arm she is right-hand dominant previous total hip arthroplasties by Dr. Zollie Beckers in 2014 and 2016 doing well.  Patient has not had Holter monitor evaluation.  Denies any arrhythmias.  With fall she had superficial abrasion right elbow.  Review of Systems all the systems noncontributory to HPI.   Objective: Vital Signs: Ht 5\' 4"  (1.626 m)   Wt 200 lb (90.7 kg)   BMI 34.33 kg/m   Physical Exam Constitutional:      Appearance: She is well-developed.  HENT:     Head: Normocephalic.     Right Ear: External ear normal.     Left Ear: External ear normal. There is no impacted  cerumen.  Eyes:     Pupils: Pupils are equal, round, and reactive to light.  Neck:     Thyroid: No thyromegaly.     Trachea: No tracheal deviation.  Cardiovascular:     Rate and Rhythm: Normal rate.  Pulmonary:     Effort: Pulmonary effort is normal.  Abdominal:     Palpations: Abdomen is soft.  Musculoskeletal:     Cervical back: No rigidity.  Skin:    General: Skin is warm and dry.  Neurological:     Mental Status: She is alert and oriented to person, place, and time.  Psychiatric:        Behavior: Behavior normal.    Ortho Exam axillary nerve sensation laterally over the shoulder is intact she has full left thumb and finger extension mild finger swelling.  Coaptation splint is well position as well as shoulder immobilizer with sling.  Median ulnar sensation is intact.  Specialty Comments:  No specialty comments available.  Imaging: No results found.   PMFS History: Patient Active Problem List   Diagnosis Date Noted   Posterior vitreous detachment of both eyes 07/15/2020   Controlled type 2 diabetes mellitus with stable proliferative retinopathy of both eyes, without long-term current use of insulin ([place])  07/15/2020   Pseudophakia, both eyes 07/15/2020   Orthostatic hypotension 12/12/2018   Advance care planning 09/13/2017   Orthostatic dizziness 12/16/2015   Diabetes mellitus due to underlying condition with retinopathy ([place]) 12/16/2015   Hypertriglyceridemia 12/16/2015   Post-traumatic osteoarthritis of right knee 12/16/2015   Essential hypertension 12/16/2015   Osteoarthritis of left hip 06/01/2015   Status post total replacement of left hip 06/01/2015   Dizzy spells 12/09/2014   Cystic thyroid nodule 12/09/2014   Generalized anxiety disorder 11/25/2014   Diabetic retinopathy ([place])    Other and unspecified hyperlipidemia    Degenerative arthritis of hip 11/29/2012   Past Medical History:  Diagnosis Date   Anxiety    regarding surgery; occassionally pt.  states   Anxiety state, unspecified    Arthritis    Asymptomatic varicose veins    Chronic kidney disease    Diabetic retinopathy ([facility])    Edema    Hard of hearing    Hyperpotassemia    Hypertension    Inflammation of joint of knee    right knee   Insomnia, unspecified    Multiple thyroid nodules    Osteoarthrosis, unspecified whether generalized or localized, unspecified site    Other abnormal blood chemistry    Other and unspecified hyperlipidemia    Other malaise and fatigue    Other specified disease of nail    Overweight(278.02)    Pain in joint, lower leg    Pain in joint, pelvic region and thigh    Peripheral vascular disease ([place])    varicose veins-  "Blood Clot anterior left LEG WITH PREGNANCY"   PONV (postoperative nausea and vomiting)    pt. states that she was sick after hip surgery and oral surgery   Type II or unspecified type diabetes mellitus with renal manifestations, not stated as uncontrolled(250.40)    Undiagnosed cardiac murmurs    Unspecified disorder of kidney and ureter    Unspecified disorder of kidney and ureter    Urinary tract infection, site not specified     No family history on file.  Past Surgical History:  Procedure Laterality Date   ABDOMINAL HYSTERECTOMY     EYE SURGERY Bilateral    cataract extraction with IOL   LIPOMA EXCISION     x2  bilateral buttocks   spinal meningitis  1957   TONSILLECTOMY     TOTAL HIP ARTHROPLASTY Right 11/29/2012   Procedure: RIGHT TOTAL HIP ARTHROPLASTY ANTERIOR APPROACH;  Surgeon: Mcarthur Rossetti, MD;  Location: [facility];  Service: Orthopedics;  Laterality: Right;   TOTAL HIP ARTHROPLASTY Left 06/01/2015   Procedure: LEFT TOTAL HIP ARTHROPLASTY ANTERIOR APPROACH;  Surgeon: Mcarthur Rossetti, MD;  Location: [facility];  Service: Orthopedics;  Laterality: Left;   Social History   Occupational History   Not on file  Tobacco Use   Smoking status: Never   Smokeless tobacco: Never  Vaping Use   Vaping  Use: Never used  Substance and Sexual Activity   Alcohol use: No   Drug use: No   Sexual activity: Not on file

## 2021-03-31 ENCOUNTER — Telehealth: Payer: Self-pay | Admitting: Orthopaedic Surgery

## 2021-03-31 NOTE — Telephone Encounter (Signed)
Pt's daughter Jackelyn Poling. PT and daughter has medical questions about discharge instructions. Please call pt at [phone].

## 2021-03-31 NOTE — Telephone Encounter (Signed)
I called Debbie. She states that her mother is staying in the recliner and not really getting up unless it is to go to the bathroom. She is concerned that she is developing bed sores and will start to have problems with those on top of the arm. I explained that patient does not have to stay in the recliner until she is seen back in the office, but she does need to be sure that someone is there to help if she is up getting around. I also explained that the pain medication is an added fall risk issue. Debbie expressed understanding and will call back if continued problems or questions.

## 2021-04-05 ENCOUNTER — Other Ambulatory Visit: Payer: Self-pay

## 2021-04-05 ENCOUNTER — Ambulatory Visit (INDEPENDENT_AMBULATORY_CARE_PROVIDER_SITE_OTHER): Payer: Medicare Other

## 2021-04-05 DIAGNOSIS — M79602 Pain in left arm: Secondary | ICD-10-CM

## 2021-04-05 NOTE — Progress Notes (Signed)
Patient's left arm was rewrapped.  Patient will follow up with her scheduled appointment.

## 2021-04-12 ENCOUNTER — Encounter: Payer: Self-pay | Admitting: Orthopaedic Surgery

## 2021-04-12 ENCOUNTER — Ambulatory Visit: Payer: Self-pay

## 2021-04-12 ENCOUNTER — Other Ambulatory Visit: Payer: Self-pay

## 2021-04-12 ENCOUNTER — Ambulatory Visit (INDEPENDENT_AMBULATORY_CARE_PROVIDER_SITE_OTHER): Payer: Medicare Other | Admitting: Orthopaedic Surgery

## 2021-04-12 VITALS — BP 126/69 | Ht 64.0 in | Wt 200.0 lb

## 2021-04-12 DIAGNOSIS — M79602 Pain in left arm: Secondary | ICD-10-CM

## 2021-04-12 NOTE — Progress Notes (Signed)
Office Visit Note   Patient: Julie Baxter           Date of Birth: [birth year]           MRN: [id number] Visit Date: 04/12/2021              Requested by: Lauree Chandler, NP [place],  [place] [address] PCP: Lauree Chandler, NP   Assessment & Plan: Visit Diagnoses:  1. Left arm pain     Plan: Fracture is reduced Sarmiento brace is applied tightened down securely and postreduction x-rays show improved alignment.  She will keep it on at all times and will return in 4 weeks repeat x-rays on return AP and lateral left humeral shaft fracture. gLOBAL  Follow-Up Instructions: Return in about 4 weeks (around 05/10/2021).   Orders:  Orders Placed This Encounter  Procedures   XR Humerus Left   No orders of the defined types were placed in this encounter.     Procedures: No procedures performed   Clinical Data: No additional findings.   Subjective: Chief Complaint  Patient presents with   Left Upper Arm - Fracture    Fall 03/26/2021    HPI 85 year old female returns she has been in coaptation splint and sling for her humeral shaft fracture.  Today she is here for reduction in Sarmiento brace application.  Review of Systems updated unchanged.   Objective: Vital Signs: BP 126/69   Ht 5\' 4"  (1.626 m)   Wt 200 lb (90.7 kg)   BMI 34.33 kg/m   Physical Exam no change in general physical exam.  Ortho Exam neurologically intact left upper extremity.  Splint is removed today skin is intact.  Specialty Comments:  No specialty comments available.  Imaging: No results found.   PMFS History: Patient Active Problem List   Diagnosis Date Noted   Posterior vitreous detachment of both eyes 07/15/2020   Controlled type 2 diabetes mellitus with stable proliferative retinopathy of both eyes, without long-term current use of insulin ([place]) 07/15/2020   Pseudophakia, both eyes 07/15/2020   Orthostatic hypotension 12/12/2018   Advance care planning  09/13/2017   Orthostatic dizziness 12/16/2015   Diabetes mellitus due to underlying condition with retinopathy ([place]) 12/16/2015   Hypertriglyceridemia 12/16/2015   Post-traumatic osteoarthritis of right knee 12/16/2015   Essential hypertension 12/16/2015   Osteoarthritis of left hip 06/01/2015   Status post total replacement of left hip 06/01/2015   Dizzy spells 12/09/2014   Cystic thyroid nodule 12/09/2014   Generalized anxiety disorder 11/25/2014   Diabetic retinopathy ([place])    Other and unspecified hyperlipidemia    Degenerative arthritis of hip 11/29/2012   Past Medical History:  Diagnosis Date   Anxiety    regarding surgery; occassionally pt. states   Anxiety state, unspecified    Arthritis    Asymptomatic varicose veins    Chronic kidney disease    Diabetic retinopathy (HCC)    Edema    Hard of hearing    Hyperpotassemia    Hypertension    Inflammation of joint of knee    right knee   Insomnia, unspecified    Multiple thyroid nodules    Osteoarthrosis, unspecified whether generalized or localized, unspecified site    Other abnormal blood chemistry    Other and unspecified hyperlipidemia    Other malaise and fatigue    Other specified disease of nail    Overweight(278.02)    Pain in joint, lower leg    Pain  in joint, pelvic region and thigh    Peripheral vascular disease ([place])    varicose veins-  "Blood Clot anterior left LEG WITH PREGNANCY"   PONV (postoperative nausea and vomiting)    pt. states that she was sick after hip surgery and oral surgery   Type II or unspecified type diabetes mellitus with renal manifestations, not stated as uncontrolled(250.40)    Undiagnosed cardiac murmurs    Unspecified disorder of kidney and ureter    Unspecified disorder of kidney and ureter    Urinary tract infection, site not specified     No family history on file.  Past Surgical History:  Procedure Laterality Date   ABDOMINAL HYSTERECTOMY     EYE SURGERY Bilateral     cataract extraction with IOL   LIPOMA EXCISION     x2  bilateral buttocks   spinal meningitis  1957   TONSILLECTOMY     TOTAL HIP ARTHROPLASTY Right 11/29/2012   Procedure: RIGHT TOTAL HIP ARTHROPLASTY ANTERIOR APPROACH;  Surgeon: Mcarthur Rossetti, MD;  Location: [facility];  Service: Orthopedics;  Laterality: Right;   TOTAL HIP ARTHROPLASTY Left 06/01/2015   Procedure: LEFT TOTAL HIP ARTHROPLASTY ANTERIOR APPROACH;  Surgeon: Mcarthur Rossetti, MD;  Location: [facility];  Service: Orthopedics;  Laterality: Left;   Social History   Occupational History   Not on file  Tobacco Use   Smoking status: Never   Smokeless tobacco: Never  Vaping Use   Vaping Use: Never used  Substance and Sexual Activity   Alcohol use: No   Drug use: No   Sexual activity: Not on file

## 2021-04-14 ENCOUNTER — Encounter (INDEPENDENT_AMBULATORY_CARE_PROVIDER_SITE_OTHER): Payer: Medicare Other | Admitting: Ophthalmology

## 2021-05-04 ENCOUNTER — Ambulatory Visit: Payer: Self-pay | Admitting: Nurse Practitioner

## 2021-05-10 ENCOUNTER — Other Ambulatory Visit: Payer: Self-pay

## 2021-05-10 ENCOUNTER — Encounter: Payer: Self-pay | Admitting: Orthopaedic Surgery

## 2021-05-10 ENCOUNTER — Ambulatory Visit: Payer: Medicare Other | Admitting: Orthopaedic Surgery

## 2021-05-10 ENCOUNTER — Ambulatory Visit: Payer: Medicare Other | Admitting: Family

## 2021-05-10 ENCOUNTER — Ambulatory Visit (INDEPENDENT_AMBULATORY_CARE_PROVIDER_SITE_OTHER): Payer: Medicare Other

## 2021-05-10 VITALS — BP 116/72 | Ht 64.0 in | Wt 200.0 lb

## 2021-05-10 DIAGNOSIS — M79602 Pain in left arm: Secondary | ICD-10-CM

## 2021-05-10 NOTE — Progress Notes (Signed)
Post-Op Visit Note   Patient: Julie Baxter           Date of Birth: 1936-08-28           MRN: [id number] Visit Date: 05/10/2021 PCP: Sandrea Hughs, NP   Assessment & Plan: Follow-up Sarmiento treatment for 03/26/2021 fall.  Patient has slight popping with motion but proximal and distal humerus begin to move in 1 unit and she can actually abduct 70 degrees without pain or angular deformity.  Pressure proximal and distal in opposite directions does not show deformity.  She appears to have slight motion at the fracture site significantly less than last visit.  With internal and external rotation it appears to move his 1 unit.  Skin is raw from extended time in the [place].  We will place her in the shoulder immobilizer have her not use her hand and recheck her in 2 weeks with repeat exam.  Chief Complaint:  Chief Complaint  Patient presents with   left humerus fracture    Fall 03/26/2021   Visit Diagnoses:  1. Left arm pain     Plan: Return in 2 weeks for repeat exam.  Continue sling no use of hand or arm.  Follow-Up Instructions: Return in about 2 weeks (around 05/24/2021).   Orders:  Orders Placed This Encounter  Procedures   XR Humerus Left   No orders of the defined types were placed in this encounter.   Imaging: No results found.  PMFS History: Patient Active Problem List   Diagnosis Date Noted   Posterior vitreous detachment of both eyes 07/15/2020   Controlled type 2 diabetes mellitus with stable proliferative retinopathy of both eyes, without long-term current use of insulin ([place]) 07/15/2020   Pseudophakia, both eyes 07/15/2020   Orthostatic hypotension 12/12/2018   Advance care planning 09/13/2017   Orthostatic dizziness 12/16/2015   Diabetes mellitus due to underlying condition with retinopathy ([place]) 12/16/2015   Hypertriglyceridemia 12/16/2015   Post-traumatic osteoarthritis of right knee 12/16/2015   Essential hypertension 12/16/2015   Osteoarthritis  of left hip 06/01/2015   Status post total replacement of left hip 06/01/2015   Dizzy spells 12/09/2014   Cystic thyroid nodule 12/09/2014   Generalized anxiety disorder 11/25/2014   Diabetic retinopathy ([place])    Other and unspecified hyperlipidemia    Degenerative arthritis of hip 11/29/2012   Past Medical History:  Diagnosis Date   Anxiety    regarding surgery; occassionally pt. states   Anxiety state, unspecified    Arthritis    Asymptomatic varicose veins    Chronic kidney disease    Diabetic retinopathy (HCC)    Edema    Hard of hearing    Hyperpotassemia    Hypertension    Inflammation of joint of knee    right knee   Insomnia, unspecified    Multiple thyroid nodules    Osteoarthrosis, unspecified whether generalized or localized, unspecified site    Other abnormal blood chemistry    Other and unspecified hyperlipidemia    Other malaise and fatigue    Other specified disease of nail    Overweight(278.02)    Pain in joint, lower leg    Pain in joint, pelvic region and thigh    Peripheral vascular disease ([place])    varicose veins-  "Blood Clot anterior left LEG WITH PREGNANCY"   PONV (postoperative nausea and vomiting)    pt. states that she was sick after hip surgery and oral surgery   Type II or unspecified  type diabetes mellitus with renal manifestations, not stated as uncontrolled(250.40)    Undiagnosed cardiac murmurs    Unspecified disorder of kidney and ureter    Unspecified disorder of kidney and ureter    Urinary tract infection, site not specified     No family history on file.  Past Surgical History:  Procedure Laterality Date   ABDOMINAL HYSTERECTOMY     EYE SURGERY Bilateral    cataract extraction with IOL   LIPOMA EXCISION     x2  bilateral buttocks   spinal meningitis  1957   TONSILLECTOMY     TOTAL HIP ARTHROPLASTY Right 11/29/2012   Procedure: RIGHT TOTAL HIP ARTHROPLASTY ANTERIOR APPROACH;  Surgeon: Mcarthur Rossetti, MD;  Location: [facility];  Service: Orthopedics;  Laterality: Right;   TOTAL HIP ARTHROPLASTY Left 06/01/2015   Procedure: LEFT TOTAL HIP ARTHROPLASTY ANTERIOR APPROACH;  Surgeon: Mcarthur Rossetti, MD;  Location: [facility];  Service: Orthopedics;  Laterality: Left;   Social History   Occupational History   Not on file  Tobacco Use   Smoking status: Never   Smokeless tobacco: Never  Vaping Use   Vaping Use: Never used  Substance and Sexual Activity   Alcohol use: No   Drug use: No   Sexual activity: Not on file

## 2021-05-11 ENCOUNTER — Telehealth: Payer: Self-pay | Admitting: Orthopaedic Surgery

## 2021-05-11 NOTE — Telephone Encounter (Signed)
Pt states she need to be worked in on sept 26 or 27th for a xray recheck.   CB [phone]

## 2021-05-11 NOTE — Telephone Encounter (Signed)
I called patient and made appointment.

## 2021-05-24 ENCOUNTER — Ambulatory Visit: Payer: Medicare Other | Admitting: Orthopaedic Surgery

## 2021-05-24 ENCOUNTER — Other Ambulatory Visit: Payer: Self-pay

## 2021-05-24 ENCOUNTER — Encounter: Payer: Self-pay | Admitting: Orthopaedic Surgery

## 2021-05-24 ENCOUNTER — Ambulatory Visit: Payer: Self-pay

## 2021-05-24 VITALS — BP 117/70 | Ht 64.0 in | Wt 200.0 lb

## 2021-05-24 DIAGNOSIS — S42332S Displaced oblique fracture of shaft of humerus, left arm, sequela: Secondary | ICD-10-CM

## 2021-05-24 DIAGNOSIS — M79602 Pain in left arm: Secondary | ICD-10-CM

## 2021-05-24 NOTE — Progress Notes (Signed)
Office Visit Note   Patient: Julie Baxter           Date of Birth: [birth year]           MRN: [id number] Visit Date: 05/24/2021              Requested by: Sandrea Hughs, NP [address] [place],  [place] [address] PCP: Sandrea Hughs, NP   Assessment & Plan: Visit Diagnoses:  1. Left arm pain   2. Closed displaced oblique fracture of shaft of left humerus, sequela     Plan: Patient appears to be making bone with a slow healing of the fracture we will wait 1 more month repeat x-rays 2 views humerus on return.  We discussed that if she does not continue to progress with healing then surgery may be needed.   Follow-Up Instructions: Return in about 1 month (around 06/23/2021).   Orders:  Orders Placed This Encounter  Procedures   XR Humerus Left   No orders of the defined types were placed in this encounter.     Procedures: No procedures performed   Clinical Data: No additional findings.   Subjective: Chief Complaint  Patient presents with   Left Upper Arm - Fracture, Follow-up    Fall 03/26/2021    HPI patient turns follow-up fall 03/26/2021 approximately wrist fracture treated with Sarmiento.  She had skin problems in Sarmiento was stopped skin is now healed.  Areas that were red have returned back to normal.  She is able to move her arm without pain but occasionally with torquing it she feels a pop or click.  She has been using the sling as recommended.  Review of Systems reviewed updated unchanged   Objective: Vital Signs: BP 117/70   Ht 5\' 4"  (1.626 m)   Wt 200 lb (90.7 kg)   BMI 34.33 kg/m   Physical Exam Constitutional:      Appearance: She is well-developed.  HENT:     Head: Normocephalic.     Right Ear: External ear normal.     Left Ear: External ear normal. There is no impacted cerumen.  Eyes:     Pupils: Pupils are equal, round, and reactive to light.  Neck:     Thyroid: No thyromegaly.     Trachea: No tracheal deviation.   Cardiovascular:     Rate and Rhythm: Normal rate.  Pulmonary:     Effort: Pulmonary effort is normal.  Abdominal:     Palpations: Abdomen is soft.  Musculoskeletal:     Cervical back: No rigidity.  Skin:    General: Skin is warm and dry.  Neurological:     Mental Status: She is alert and oriented to person, place, and time.  Psychiatric:        Behavior: Behavior normal.    Ortho Exam patient can take care pain to get it to the top of her head.  Rotation is not painful and proximal distal humerus move is 1 piece.  With stressing occasionally small pop is palpable but no angulation of the fracture is noted.  Specialty Comments:  No specialty comments available.  Imaging: No results found.   PMFS History: Patient Active Problem List   Diagnosis Date Noted   Closed displaced oblique fracture of shaft of left humerus 05/30/2021   Posterior vitreous detachment of both eyes 07/15/2020   Controlled type 2 diabetes mellitus with stable proliferative retinopathy of both eyes, without long-term current use of insulin ([place]) 07/15/2020  Pseudophakia, both eyes 07/15/2020   Orthostatic hypotension 12/12/2018   Advance care planning 09/13/2017   Orthostatic dizziness 12/16/2015   Diabetes mellitus due to underlying condition with retinopathy ([place]) 12/16/2015   Hypertriglyceridemia 12/16/2015   Post-traumatic osteoarthritis of right knee 12/16/2015   Essential hypertension 12/16/2015   Osteoarthritis of left hip 06/01/2015   Status post total replacement of left hip 06/01/2015   Dizzy spells 12/09/2014   Cystic thyroid nodule 12/09/2014   Generalized anxiety disorder 11/25/2014   Diabetic retinopathy ([place])    Other and unspecified hyperlipidemia    Degenerative arthritis of hip 11/29/2012   Past Medical History:  Diagnosis Date   Anxiety    regarding surgery; occassionally pt. states   Anxiety state, unspecified    Arthritis    Asymptomatic varicose veins    Chronic kidney  disease    Diabetic retinopathy (HCC)    Edema    Hard of hearing    Hyperpotassemia    Hypertension    Inflammation of joint of knee    right knee   Insomnia, unspecified    Multiple thyroid nodules    Osteoarthrosis, unspecified whether generalized or localized, unspecified site    Other abnormal blood chemistry    Other and unspecified hyperlipidemia    Other malaise and fatigue    Other specified disease of nail    Overweight(278.02)    Pain in joint, lower leg    Pain in joint, pelvic region and thigh    Peripheral vascular disease ([place])    varicose veins-  "Blood Clot anterior left LEG WITH PREGNANCY"   PONV (postoperative nausea and vomiting)    pt. states that she was sick after hip surgery and oral surgery   Type II or unspecified type diabetes mellitus with renal manifestations, not stated as uncontrolled(250.40)    Undiagnosed cardiac murmurs    Unspecified disorder of kidney and ureter    Unspecified disorder of kidney and ureter    Urinary tract infection, site not specified     No family history on file.  Past Surgical History:  Procedure Laterality Date   ABDOMINAL HYSTERECTOMY     EYE SURGERY Bilateral    cataract extraction with IOL   LIPOMA EXCISION     x2  bilateral buttocks   spinal meningitis  1957   TONSILLECTOMY     TOTAL HIP ARTHROPLASTY Right 11/29/2012   Procedure: RIGHT TOTAL HIP ARTHROPLASTY ANTERIOR APPROACH;  Surgeon: Mcarthur Rossetti, MD;  Location: [facility];  Service: Orthopedics;  Laterality: Right;   TOTAL HIP ARTHROPLASTY Left 06/01/2015   Procedure: LEFT TOTAL HIP ARTHROPLASTY ANTERIOR APPROACH;  Surgeon: Mcarthur Rossetti, MD;  Location: [facility];  Service: Orthopedics;  Laterality: Left;   Social History   Occupational History   Not on file  Tobacco Use   Smoking status: Never   Smokeless tobacco: Never  Vaping Use   Vaping Use: Never used  Substance and Sexual Activity   Alcohol use: No   Drug use: No   Sexual activity:  Not on file

## 2021-05-26 ENCOUNTER — Ambulatory Visit (INDEPENDENT_AMBULATORY_CARE_PROVIDER_SITE_OTHER): Payer: Medicare Other | Admitting: Ophthalmology

## 2021-05-26 ENCOUNTER — Other Ambulatory Visit: Payer: Self-pay

## 2021-05-26 ENCOUNTER — Encounter (INDEPENDENT_AMBULATORY_CARE_PROVIDER_SITE_OTHER): Payer: Medicare Other | Admitting: Ophthalmology

## 2021-05-26 ENCOUNTER — Encounter (INDEPENDENT_AMBULATORY_CARE_PROVIDER_SITE_OTHER): Payer: Self-pay | Admitting: Ophthalmology

## 2021-05-26 DIAGNOSIS — H43813 Vitreous degeneration, bilateral: Secondary | ICD-10-CM | POA: Diagnosis not present

## 2021-05-26 DIAGNOSIS — E113553 Type 2 diabetes mellitus with stable proliferative diabetic retinopathy, bilateral: Secondary | ICD-10-CM | POA: Diagnosis not present

## 2021-05-26 NOTE — Assessment & Plan Note (Signed)

## 2021-05-26 NOTE — Assessment & Plan Note (Signed)

## 2021-05-26 NOTE — Progress Notes (Signed)
05/26/2021     CHIEF COMPLAINT Patient presents for  Chief Complaint  Patient presents with   Retina Follow Up      HISTORY OF PRESENT ILLNESS: Julie Baxter is a 85 y.o. female who presents to the clinic today for:   HPI     Retina Follow Up   Patient presents with  Diabetic Retinopathy.  In both eyes.  This started 10 months ago.  Severity is mild.  Duration of 10 months.  Since onset it is stable.        Comments   10 mos fu ou fp. Patient states vision is stable and unchanged since last visit. Denies any new floaters or FOL. LBS: 116 this morning A1C: 7.2 last time, has an appt in oct.      Last edited by Laurin Coder on 05/26/2021  8:43 AM.      Referring physician: Gayland Curry, DO [place],  [place] [address]  HISTORICAL INFORMATION:   Selected notes from the MEDICAL RECORD NUMBER    Lab Results  Component Value Date   HGBA1C 7.3 (H) 10/06/2020     CURRENT MEDICATIONS: Current Outpatient Medications (Ophthalmic Drugs)  Medication Sig   Propylene Glycol (SYSTANE BALANCE OP) Apply 1 drop to eye daily as needed (dry eyes).   No current facility-administered medications for this visit. (Ophthalmic Drugs)   Current Outpatient Medications (Other)  Medication Sig   ACCU-CHEK FASTCLIX LANCETS MISC Test blood sugar twice daily DX E08.319   aspirin EC 81 MG tablet Take 81 mg by mouth daily.   diclofenac Sodium (VOLTAREN) 1 % GEL Apply 2 g topically 4 (four) times daily as needed.   furosemide (LASIX) 20 MG tablet Take 20 mg by mouth daily.   glipiZIDE (GLUCOTROL) 5 MG tablet Take one tablet by mouth twice daily before meals (breakfast and lunch)   glucose blood (ACCU-CHEK SMARTVIEW) test strip USE TO CHECK BLOOD SUGAR  TWO TIMES DAILY Dx:E11.3593   metFORMIN (GLUCOPHAGE) 500 MG tablet Take 2 tablets (1,000 mg total) by mouth 2 (two) times daily with a meal.   omeprazole (PRILOSEC OTC) 20 MG tablet Take 1 tablet (20 mg total) by mouth  daily before supper.   ondansetron (ZOFRAN) 4 MG tablet Take 1 tablet (4 mg total) by mouth every 6 (six) hours as needed for nausea.   oxyCODONE (ROXICODONE) 5 MG immediate release tablet Take 1-2 tablets (5-10 mg total) by mouth every 4 (four) hours as needed for severe pain. (Patient not taking: No sig reported)   simvastatin (ZOCOR) 40 MG tablet Take 1 tablet (40 mg total) by mouth daily.   No current facility-administered medications for this visit. (Other)      REVIEW OF SYSTEMS:    ALLERGIES Allergies  Allergen Reactions   Elemental Sulfur Hives   Naproxen Swelling and Other (See Comments)    Blacked out - Face swelling    Tramadol Nausea And Vomiting    PAST MEDICAL HISTORY Past Medical History:  Diagnosis Date   Anxiety    regarding surgery; occassionally pt. states   Anxiety state, unspecified    Arthritis    Asymptomatic varicose veins    Chronic kidney disease    Diabetic retinopathy ([place])    Edema    Hard of hearing    Hyperpotassemia    Hypertension    Inflammation of joint of knee    right knee   Insomnia, unspecified    Multiple thyroid nodules  Osteoarthrosis, unspecified whether generalized or localized, unspecified site    Other abnormal blood chemistry    Other and unspecified hyperlipidemia    Other malaise and fatigue    Other specified disease of nail    Overweight(278.02)    Pain in joint, lower leg    Pain in joint, pelvic region and thigh    Peripheral vascular disease ([place])    varicose veins-  "Blood Clot anterior left LEG WITH PREGNANCY"   PONV (postoperative nausea and vomiting)    pt. states that she was sick after hip surgery and oral surgery   Type II or unspecified type diabetes mellitus with renal manifestations, not stated as uncontrolled(250.40)    Undiagnosed cardiac murmurs    Unspecified disorder of kidney and ureter    Unspecified disorder of kidney and ureter    Urinary tract infection, site not specified    Past  Surgical History:  Procedure Laterality Date   ABDOMINAL HYSTERECTOMY     EYE SURGERY Bilateral    cataract extraction with IOL   LIPOMA EXCISION     x2  bilateral buttocks   spinal meningitis  1957   TONSILLECTOMY     TOTAL HIP ARTHROPLASTY Right 11/29/2012   Procedure: RIGHT TOTAL HIP ARTHROPLASTY ANTERIOR APPROACH;  Surgeon: Mcarthur Rossetti, MD;  Location: [facility];  Service: Orthopedics;  Laterality: Right;   TOTAL HIP ARTHROPLASTY Left 06/01/2015   Procedure: LEFT TOTAL HIP ARTHROPLASTY ANTERIOR APPROACH;  Surgeon: Mcarthur Rossetti, MD;  Location: [facility];  Service: Orthopedics;  Laterality: Left;    FAMILY HISTORY History reviewed. No pertinent family history.  SOCIAL HISTORY Social History   Tobacco Use   Smoking status: Never   Smokeless tobacco: Never  Vaping Use   Vaping Use: Never used  Substance Use Topics   Alcohol use: No   Drug use: No         OPHTHALMIC EXAM:  Base Eye Exam     Visual Acuity (ETDRS)       Right Left   Dist [place] 20/25 20/40 -2   Dist ph [place]  20/25 -1         Tonometry (Tonopen, 8:47 AM)       Right Left   Pressure 9 13         Pupils       Pupils Dark Light React APD   Right PERRL 4 3 Slow None   Left PERRL 4 3 Slow None         Visual Fields (Counting fingers)       Left Right    Full Full         Extraocular Movement       Right Left    Full Full         Neuro/Psych     Oriented x3: Yes   Mood/Affect: Normal         Dilation     Both eyes: 1.0% Mydriacyl, 2.5% Phenylephrine @ 8:47 AM           Slit Lamp and Fundus Exam     External Exam       Right Left   External Normal Normal         Slit Lamp Exam       Right Left   Lids/Lashes Normal Normal   Conjunctiva/Sclera White and quiet White and quiet   Cornea Clear Clear   Anterior Chamber Deep and quiet Deep and quiet   Iris Round  and reactive Round and reactive   Lens Centered posterior chamber intraocular lens  Centered posterior chamber intraocular lens   Anterior Vitreous Normal Normal         Fundus Exam       Right Left   Posterior Vitreous Posterior vitreous detachment Posterior vitreous detachment   Disc Normal Normal   C/D Ratio 0.2 0.2   Macula Normal, good focal laser, no active CSME Good focal laser temporally   Vessels PDR-quiet PDR-quiet   Periphery Moderate scatter photocoagulation nasally Moderate scatter photocoagulation nasally            IMAGING AND PROCEDURES  Imaging and Procedures for 05/26/21  Color Fundus Photography Optos - OU - Both Eyes       Right Eye Progression has been stable. Macula : microaneurysms.   Left Eye Progression has been stable. Disc findings include normal observations. Macula : microaneurysms.   Notes Good focal laser scars temporally left eye  Quiescent PDR, we was only early PDR in the past,, watch anteriorly OS  Good PRP nasally OU stable Clear media OU             ASSESSMENT/PLAN:  Controlled type 2 diabetes mellitus with stable proliferative retinopathy of both eyes, without long-term current use of insulin (HCC) The nature of regressed proliferative diabetic retinopathy was discussed with the patient. The patient was advised to maintain good glucose, blood pressure, monitor kidney function and serum lipid control as advised by personal physician. Rare risk for reactivation of progression exist with untreated severe anemia, untreated renal failure, untreated heart failure, and smoking. Complete avoidance of smoking was recommended. The chance of recurrent proliferative diabetic retinopathy was discussed as well as the chance of vitreous hemorrhage for which further treatments may be necessary.   Explained to the patient that the quiescent  proliferative diabetic retinopathy disease is unlikely to ever worsen.  Worsening factors would include however severe anemia, hypertension out-of-control or impending renal  failure.  Posterior vitreous detachment of both eyes  The nature of posterior vitreous detachment was discussed with the patient as well as its physiology, its age prevalence, and its possible implication regarding retinal breaks and detachment.  An informational brochure was offered to the patient.  All the patient's questions were answered.  The patient was asked to return if new or different flashes or floaters develops.   Patient was instructed to contact office immediately if any new changes were noticed. I explained to the patient that vitreous inside the eye is similar to jello inside a bowl. As the jello melts it can start to pull away from the bowl, similarly the vitreous throughout our lives can begin to pull away from the retina. That process is called a posterior vitreous detachment. In some cases, the vitreous can tug hard enough on the retina to form a retinal tear. I discussed with the patient the signs and symptoms of a retinal detachment.  Do not rub the eye.      ICD-10-CM   1. Controlled type 2 diabetes mellitus with stable proliferative retinopathy of both eyes, without long-term current use of insulin (HCC)  P38.2505 Color Fundus Photography Optos - OU - Both Eyes    2. Posterior vitreous detachment of both eyes  H43.813       1.  2.  3.  Ophthalmic Meds Ordered this visit:  No orders of the defined types were placed in this encounter.      Return in about 9 months (around 02/23/2022)  for DILATE OU, COLOR FP, OCT.  There are no Patient Instructions on file for this visit.   Explained the diagnoses, plan, and follow up with the patient and they expressed understanding.  Patient expressed understanding of the importance of proper follow up care.   Clent Demark Tequita Marrs M.D. Diseases & Surgery of the Retina and Vitreous Retina & Diabetic [place] 05/26/21     Abbreviations: M myopia (nearsighted); A astigmatism; H hyperopia (farsighted); P presbyopia; Mrx  spectacle prescription;  CTL contact lenses; OD right eye; OS left eye; OU both eyes  XT exotropia; ET esotropia; PEK punctate epithelial keratitis; PEE punctate epithelial erosions; DES dry eye syndrome; MGD meibomian gland dysfunction; ATs artificial tears; PFAT's preservative free artificial tears; [place] nuclear sclerotic cataract; PSC posterior subcapsular cataract; ERM epi-retinal membrane; PVD posterior vitreous detachment; RD retinal detachment; DM diabetes mellitus; DR diabetic retinopathy; NPDR non-proliferative diabetic retinopathy; PDR proliferative diabetic retinopathy; CSME clinically significant macular edema; DME diabetic macular edema; dbh dot blot hemorrhages; CWS cotton wool spot; POAG primary open angle glaucoma; C/D cup-to-disc ratio; HVF humphrey visual field; GVF goldmann visual field; OCT optical coherence tomography; IOP intraocular pressure; BRVO Branch retinal vein occlusion; CRVO central retinal vein occlusion; CRAO central retinal artery occlusion; BRAO branch retinal artery occlusion; RT retinal tear; SB scleral buckle; PPV pars plana vitrectomy; VH Vitreous hemorrhage; PRP panretinal laser photocoagulation; IVK intravitreal kenalog; VMT vitreomacular traction; MH Macular hole;  NVD neovascularization of the disc; NVE neovascularization elsewhere; AREDS age related eye disease study; ARMD age related macular degeneration; POAG primary open angle glaucoma; EBMD epithelial/anterior basement membrane dystrophy; ACIOL anterior chamber intraocular lens; IOL intraocular lens; PCIOL posterior chamber intraocular lens; Phaco/IOL phacoemulsification with intraocular lens placement; [place] photorefractive keratectomy; LASIK laser assisted in situ keratomileusis; HTN hypertension; DM diabetes mellitus; COPD chronic obstructive pulmonary disease

## 2021-05-30 DIAGNOSIS — S42332A Displaced oblique fracture of shaft of humerus, left arm, initial encounter for closed fracture: Secondary | ICD-10-CM | POA: Insufficient documentation

## 2021-05-31 ENCOUNTER — Ambulatory Visit: Payer: Medicare Other | Admitting: Family

## 2021-06-03 ENCOUNTER — Other Ambulatory Visit: Payer: Self-pay

## 2021-06-03 ENCOUNTER — Encounter: Payer: Self-pay | Admitting: Family

## 2021-06-03 ENCOUNTER — Ambulatory Visit (INDEPENDENT_AMBULATORY_CARE_PROVIDER_SITE_OTHER): Payer: Medicare Other | Admitting: Family

## 2021-06-03 VITALS — BP 118/82 | HR 85 | Temp 98.4°F | Ht 64.0 in | Wt 171.4 lb

## 2021-06-03 DIAGNOSIS — E781 Pure hyperglyceridemia: Secondary | ICD-10-CM

## 2021-06-03 DIAGNOSIS — I10 Essential (primary) hypertension: Secondary | ICD-10-CM | POA: Diagnosis not present

## 2021-06-03 DIAGNOSIS — S42332D Displaced oblique fracture of shaft of humerus, left arm, subsequent encounter for fracture with routine healing: Secondary | ICD-10-CM

## 2021-06-03 DIAGNOSIS — E113593 Type 2 diabetes mellitus with proliferative diabetic retinopathy without macular edema, bilateral: Secondary | ICD-10-CM

## 2021-06-03 DIAGNOSIS — I951 Orthostatic hypotension: Secondary | ICD-10-CM | POA: Diagnosis not present

## 2021-06-03 NOTE — Progress Notes (Signed)
Provider: Richarda Blade FNP-C   Julie Baxter, Julie Citrin, NP  Patient Care Team: Julie Baxter, Julie Citrin, NP as PCP - General (Family Medicine) Julie Axe [name], MD as Consulting Physician (Ophthalmology)  Extended Emergency Contact Information Primary Emergency Contact: [facility] Address: [address] [place], [place] [address] [name] Home Phone: [phone] Mobile Phone: [phone] Relation: Daughter Secondary Emergency Contact: Julie Baxter  [place] Home Phone: [phone] Relation: Son  Code Status:  Full Code Goals of care: Advanced Directive information Advanced Directives 06/03/2021  Does Patient Have a Medical Advance Directive? Yes  Type of Advance Directive Out of facility DNR (pink MOST or yellow form)  Does patient want to make changes to medical advance directive? No - Patient declined  Copy of Healthcare Power of Attorney in Chart? -  Would patient like information on creating a medical advance directive? -  Pre-existing out of facility DNR order (yellow form or pink MOST form) Yellow form placed in chart (order not valid for inpatient use)     Chief Complaint  Patient presents with   Medical Management of Chronic Issues    Routine follow up visit.    Health Maintenance    COVID vaccine, Zoster, eye exam,foot exam, Flu vaccine    HPI:  Pt is a 85 y.o. female seen today for medical management of chronic diseases.   States blood pressure has stabilized used run sometimes in the 80's/70's but recent has been within normal sometimes SBP 140's.B/p 118/82.denies any headache,vision changes,fatigue,chest tightness,palpitation,chest pain or shortness of breath.  States fell forward  in July,2022 hitting he refrigerator.  States CBG ranges in the 80's -130's. She was seen by Opthalmologic [name]    Has had weight loss 29 lbs since previous visit. Due COVID-19,Shingrix    Has not been taking her Furosemide.No swelling  on the legs.wears  compression stocking during the water.    Past Medical History:  Diagnosis Date   Anxiety    regarding surgery; occassionally pt. states   Anxiety state, unspecified    Arthritis    Asymptomatic varicose veins    Broken arm    Chronic kidney disease    Diabetic retinopathy (HCC)    Edema    Hard of hearing    Hyperpotassemia    Hypertension    Inflammation of joint of knee    right knee   Insomnia, unspecified    Multiple thyroid nodules    Osteoarthrosis, unspecified whether generalized or localized, unspecified site    Other abnormal blood chemistry    Other and unspecified hyperlipidemia    Other malaise and fatigue    Other specified disease of nail    Overweight(278.02)    Pain in joint, lower leg    Pain in joint, pelvic region and thigh    Peripheral vascular disease (HCC)    varicose veins-  "Blood Clot anterior left LEG WITH PREGNANCY"   PONV (postoperative nausea and vomiting)    pt. states that she was sick after hip surgery and oral surgery   Type II or unspecified type diabetes mellitus with renal manifestations, not stated as uncontrolled(250.40)    Undiagnosed cardiac murmurs    Unspecified disorder of kidney and ureter    Unspecified disorder of kidney and ureter    Urinary tract infection, site not specified    Past Surgical History:  Procedure Laterality Date   ABDOMINAL HYSTERECTOMY     EYE SURGERY Bilateral  cataract extraction with IOL   LIPOMA EXCISION     x2  bilateral buttocks   spinal meningitis  1957   TONSILLECTOMY     TOTAL HIP ARTHROPLASTY Right 11/29/2012   Procedure: RIGHT TOTAL HIP ARTHROPLASTY ANTERIOR APPROACH;  Surgeon: Mcarthur Rossetti, MD;  Location: [facility];  Service: Orthopedics;  Laterality: Right;   TOTAL HIP ARTHROPLASTY Left 06/01/2015   Procedure: LEFT TOTAL HIP ARTHROPLASTY ANTERIOR APPROACH;  Surgeon: Mcarthur Rossetti, MD;  Location: [facility];  Service: Orthopedics;  Laterality: Left;     Allergies  Allergen Reactions   Elemental Sulfur Hives   Naproxen Swelling and Other (See Comments)    Blacked out - Face swelling    Tramadol Nausea And Vomiting    Allergies as of 06/03/2021       Reactions   Elemental Sulfur Hives   Naproxen Swelling, Other (See Comments)   Blacked out - Face swelling   Tramadol Nausea And Vomiting        Medication List        Accurate as of June 03, 2021  9:28 AM. If you have any questions, ask your nurse or doctor.          STOP taking these medications    ondansetron 4 MG tablet Commonly known as: ZOFRAN Stopped by: Sandrea Hughs, NP   oxyCODONE 5 MG immediate release tablet Commonly known as: Roxicodone Stopped by: Sandrea Hughs, NP       TAKE these medications    Accu-Chek FastClix Lancets Misc Test blood sugar twice daily DX E08.319   Accu-Chek SmartView test strip Generic drug: glucose blood USE TO CHECK BLOOD SUGAR  TWO TIMES DAILY Dx:E11.3593   aspirin EC 81 MG tablet Take 81 mg by mouth daily.   diclofenac Sodium 1 % Gel Commonly known as: VOLTAREN Apply 2 g topically 4 (four) times daily as needed.   furosemide 20 MG tablet Commonly known as: LASIX Take 20 mg by mouth daily.   glipiZIDE 5 MG tablet Commonly known as: GLUCOTROL Take one tablet by mouth twice daily before meals (breakfast and lunch)   metFORMIN 500 MG tablet Commonly known as: GLUCOPHAGE Take 2 tablets (1,000 mg total) by mouth 2 (two) times daily with a meal.   omeprazole 20 MG tablet Commonly known as: PriLOSEC OTC Take 1 tablet (20 mg total) by mouth daily before supper.   simvastatin 40 MG tablet Commonly known as: ZOCOR Take 1 tablet (40 mg total) by mouth daily.   SYSTANE BALANCE OP Apply 1 drop to eye daily as needed (dry eyes).        Review of Systems  Constitutional:  Negative for appetite change, chills, fatigue, fever and unexpected weight change.  HENT:  Negative for congestion, dental  problem, ear discharge, ear pain, facial swelling, hearing loss, nosebleeds, postnasal drip, rhinorrhea, sinus pressure, sinus pain, sneezing, sore throat, tinnitus and trouble swallowing.   Eyes:  Negative for pain, discharge, redness, itching and visual disturbance.  Respiratory:  Negative for cough, chest tightness, shortness of breath and wheezing.   Cardiovascular:  Negative for chest pain, palpitations and leg swelling.  Gastrointestinal:  Negative for abdominal distention, abdominal pain, blood in stool, constipation, diarrhea, nausea and vomiting.  Endocrine: Negative for cold intolerance, heat intolerance, polydipsia, polyphagia and polyuria.  Genitourinary:  Negative for difficulty urinating, dysuria, flank pain, frequency and urgency.  Musculoskeletal:  Positive for arthralgias. Negative for back pain, joint swelling, myalgias, neck pain and neck stiffness.  Left arm sling   Skin:  Negative for color change, pallor, rash and wound.  Neurological:  Negative for dizziness, syncope, speech difficulty, weakness, light-headedness, numbness and headaches.  Hematological:  Does not bruise/bleed easily.  Psychiatric/Behavioral:  Negative for agitation, behavioral problems, confusion, hallucinations, self-injury, sleep disturbance and suicidal ideas. The patient is not nervous/anxious.    Immunization History  Administered Date(s) Administered   Fluad Quad(high Dose 65+) 06/19/2019   Influenza Whole 05/28/2012   Influenza, High Dose Seasonal PF 06/13/2018   Influenza,inj,Quad PF,6+ Mos 07/17/2016   Influenza-Unspecified 07/02/2017   Pneumococcal Conjugate-13 11/06/2014   Pneumococcal Polysaccharide-23 02/23/2017   Pertinent  Health Maintenance Due  Topic Date Due   OPHTHALMOLOGY EXAM  10/23/2018   FOOT EXAM  06/18/2020   INFLUENZA VACCINE  03/28/2021   HEMOGLOBIN A1C  04/05/2021   DEXA SCAN  08/29/2023 (Originally 04/12/2003)   Fall Risk  06/03/2021 10/11/2020 12/12/2018 07/30/2018  06/13/2018  Falls in the past year? 1 0 0 0 No  Number falls in past yr: 1 0 0 0 -  Injury with Fall? 1 0 0 0 -  Risk for fall due to : History of fall(s) - - - -  Follow up Falls evaluation completed - - - -   Functional Status Survey:    Vitals:   06/03/21 0856  BP: 118/82  Pulse: 85  Temp: 98.4 F (36.9 C)  SpO2: 98%  Weight: 171 lb 6.4 oz (77.7 kg)  Height: 5' 4" (1.626 m)   Body mass index is 29.42 kg/m. Physical Exam Vitals reviewed.  Constitutional:      General: She is not in acute distress.    Appearance: Normal appearance. She is normal weight. She is not ill-appearing or diaphoretic.  HENT:     Head: Normocephalic.     Right Ear: Tympanic membrane, ear canal and external ear normal. There is no impacted cerumen.     Left Ear: Tympanic membrane, ear canal and external ear normal. There is no impacted cerumen.     Nose: Nose normal. No congestion or rhinorrhea.     Mouth/Throat:     Mouth: Mucous membranes are moist.     Pharynx: Oropharynx is clear. No oropharyngeal exudate or posterior oropharyngeal erythema.  Eyes:     General: No scleral icterus.       Right eye: No discharge.        Left eye: No discharge.     Extraocular Movements: Extraocular movements intact.     Conjunctiva/sclera: Conjunctivae normal.     Pupils: Pupils are equal, round, and reactive to light.  Neck:     Vascular: No carotid bruit.  Cardiovascular:     Rate and Rhythm: Normal rate and regular rhythm.     Pulses: Normal pulses.     Heart sounds: Normal heart sounds. No murmur heard.   No friction rub. No gallop.  Pulmonary:     Effort: Pulmonary effort is normal. No respiratory distress.     Breath sounds: Normal breath sounds. No wheezing, rhonchi or rales.  Chest:     Chest wall: No tenderness.  Abdominal:     General: Bowel sounds are normal. There is no distension.     Palpations: Abdomen is soft. There is no mass.     Tenderness: There is no abdominal tenderness. There  is no right CVA tenderness, left CVA tenderness, guarding or rebound.  Musculoskeletal:        General: No swelling or tenderness.  Cervical back: Normal range of motion. No rigidity or tenderness.     Right lower leg: No edema.     Left lower leg: No edema.     Comments: Left hand sling in place.  Lymphadenopathy:     Cervical: No cervical adenopathy.  Skin:    General: Skin is warm and dry.     Coloration: Skin is not pale.     Findings: No bruising, erythema, lesion or rash.  Neurological:     Mental Status: She is alert and oriented to person, place, and time.     Cranial Nerves: No cranial nerve deficit.     Sensory: No sensory deficit.     Motor: No weakness.     Coordination: Coordination normal.     Gait: Gait normal.  Psychiatric:        Mood and Affect: Mood normal.        Speech: Speech normal.        Behavior: Behavior normal.        Thought Content: Thought content normal.        Judgment: Judgment normal.    Labs reviewed: Recent Labs    10/06/20 0828  NA 140  K 4.6  CL 105  CO2 25  GLUCOSE 86  BUN 24  CREATININE 1.63*  CALCIUM 9.5   Recent Labs    10/06/20 0828  AST 12  ALT 7  BILITOT 0.4  PROT 6.8   Recent Labs    10/06/20 0828  WBC 10.5  NEUTROABS 6,458  HGB 12.3  HCT 37.1  MCV 91.6  PLT 279   Lab Results  Component Value Date   TSH 1.07 06/13/2018   Lab Results  Component Value Date   HGBA1C 7.3 (H) 10/06/2020   Lab Results  Component Value Date   CHOL 136 10/06/2020   HDL 47 (L) 10/06/2020   LDLCALC 63 10/06/2020   TRIG 193 (H) 10/06/2020   CHOLHDL 2.9 10/06/2020    Significant Diagnostic Results in last 30 days:  Color Fundus Photography Optos - OU - Both Eyes  Result Date: 05/26/2021 Right Eye Progression has been stable. Macula : microaneurysms. Left Eye Progression has been stable. Disc findings include normal observations. Macula : microaneurysms. Notes Good focal laser scars temporally left eye Quiescent PDR,  we was only early PDR in the past,, watch anteriorly OS Good PRP nasally OU stable Clear media OU  XR Humerus Left  Result Date: 05/30/2021 Multiple views left humerus demonstrate spiral oblique fracture humeral shaft proximal third with some progressive callus formation comparison 825 and 05/10/2021 images.  Increased periosteal bone formation but she does not appear to have bridged. Impression: Humeral shaft fracture with some healing noted.  Obvious union has not been achieved radiographically.  XR Humerus Left  Result Date: 05/10/2021 2 view x-rays left humerus demonstrate some callus formation at the fracture site without displacement.  Fracture site without displacement.  Consistent with early healing. Impression: Left humeral shaft fracture oblique with some early callus formation.   Assessment/Plan 1. Essential hypertension B/p at goal  Continue on furosemide  - CMP with eGFR([facility]) - CBC with Differential/Platelet  2. Type 2 diabetes mellitus with proliferative retinopathy of both eyes, without long-term current use of insulin, macular edema presence unspecified, unspecified proliferative retinopathy type ([place]) Lab Results  Component Value Date   HGBA1C 7.3 (H) 10/06/2020  Continue on glipizide and metformin  - Hemoglobin A1c - CMP with eGFR([facility]) - CBC with Differential/Platelet  3. Orthostatic  hypotension Chronic.request referral to cardiology Continue on compression stockings  - Ambulatory referral to Cardiology - For home use only DME Other see comment: right hand cane for stability   4. Hypertriglyceridemia Continue simvastatin - dietary modification advised  - Lipid Panel  5. Closed displaced oblique fracture of shaft of left humerus with routine healing, subsequent encounter Pain controlled  - continue on sling  - For home use only DME Other see comment: right hand cane for gait stability  Family/ staff Communication: Reviewed plan of care with patient  verbalized understanding   Labs/tests ordered:  - CBC with Differential/Platelet - CMP with eGFR([facility]) - TSH - Hgb A1C - Lipid panel  Next Appointment : 6 months for medical management of chronic issues.  Sandrea Hughs, NP

## 2021-06-04 LAB — CBC WITH DIFFERENTIAL/PLATELET
Absolute Monocytes: 1150 cells/uL — ABNORMAL HIGH (ref 200–950)
Basophils Absolute: 35 cells/uL (ref 0–200)
Basophils Relative: 0.3 %
Eosinophils Absolute: 391 cells/uL (ref 15–500)
Eosinophils Relative: 3.4 %
HCT: 32.7 % — ABNORMAL LOW (ref 35.0–45.0)
Hemoglobin: 10.8 g/dL — ABNORMAL LOW (ref 11.7–15.5)
Lymphs Abs: 2450 cells/uL (ref 850–3900)
MCH: 30.5 pg (ref 27.0–33.0)
MCHC: 33 g/dL (ref 32.0–36.0)
MCV: 92.4 fL (ref 80.0–100.0)
MPV: 10.3 fL (ref 7.5–12.5)
Monocytes Relative: 10 %
Neutro Abs: 7475 cells/uL (ref 1500–7800)
Neutrophils Relative %: 65 %
Platelets: 312 10*3/uL (ref 140–400)
RBC: 3.54 10*6/uL — ABNORMAL LOW (ref 3.80–5.10)
RDW: 12.2 % (ref 11.0–15.0)
Total Lymphocyte: 21.3 %
WBC: 11.5 10*3/uL — ABNORMAL HIGH (ref 3.8–10.8)

## 2021-06-04 LAB — HEMOGLOBIN A1C
Hgb A1c MFr Bld: 7.7 % of total Hgb — ABNORMAL HIGH (ref <5.7)
Mean Plasma Glucose: 174 mg/dL
eAG (mmol/L): 9.7 mmol/L

## 2021-06-04 LAB — COMPLETE METABOLIC PANEL WITH GFR
AG Ratio: 1.4 (calc) (ref 1.0–2.5)
ALT: 8 U/L (ref 6–29)
AST: 13 U/L (ref 10–35)
Albumin: 3.9 g/dL (ref 3.6–5.1)
Alkaline phosphatase (APISO): 85 U/L (ref 37–153)
BUN/Creatinine Ratio: 16 (calc) (ref 6–22)
BUN: 27 mg/dL — ABNORMAL HIGH (ref 7–25)
CO2: 24 mmol/L (ref 20–32)
Calcium: 9.3 mg/dL (ref 8.6–10.4)
Chloride: 101 mmol/L (ref 98–110)
Creat: 1.69 mg/dL — ABNORMAL HIGH (ref 0.60–0.95)
Globulin: 2.7 g/dL (calc) (ref 1.9–3.7)
Glucose, Bld: 150 mg/dL — ABNORMAL HIGH (ref 65–99)
Potassium: 4.9 mmol/L (ref 3.5–5.3)
Sodium: 137 mmol/L (ref 135–146)
Total Bilirubin: 0.3 mg/dL (ref 0.2–1.2)
Total Protein: 6.6 g/dL (ref 6.1–8.1)
eGFR: 30 mL/min/{1.73_m2} — ABNORMAL LOW (ref >=60)

## 2021-06-04 LAB — LIPID PANEL
Cholesterol: 116 mg/dL (ref <200)
HDL: 47 mg/dL — ABNORMAL LOW (ref >=50)
LDL Cholesterol (Calc): 45 mg/dL (calc)
Non-HDL Cholesterol (Calc): 69 mg/dL (calc) (ref <130)
Total CHOL/HDL Ratio: 2.5 (calc) (ref <5.0)
Triglycerides: 163 mg/dL — ABNORMAL HIGH (ref <150)

## 2021-06-08 ENCOUNTER — Telehealth: Payer: Self-pay

## 2021-06-08 ENCOUNTER — Other Ambulatory Visit: Payer: Self-pay

## 2021-06-08 DIAGNOSIS — I10 Essential (primary) hypertension: Secondary | ICD-10-CM

## 2021-06-08 DIAGNOSIS — E113593 Type 2 diabetes mellitus with proliferative diabetic retinopathy without macular edema, bilateral: Secondary | ICD-10-CM

## 2021-06-08 NOTE — Telephone Encounter (Signed)
Patient called requesting labs results from 06/03/2021.  Please advise

## 2021-06-09 NOTE — Telephone Encounter (Signed)
Patient called wanting the Number to her WBC count to give to her family member that is a Marine scientist.  Given. Will be back in for recheck in 2 weeks.

## 2021-06-21 ENCOUNTER — Other Ambulatory Visit: Payer: Self-pay

## 2021-06-21 ENCOUNTER — Ambulatory Visit: Payer: Medicare Other | Admitting: Orthopaedic Surgery

## 2021-06-21 ENCOUNTER — Ambulatory Visit: Payer: Self-pay

## 2021-06-21 ENCOUNTER — Other Ambulatory Visit: Payer: Medicare Other

## 2021-06-21 ENCOUNTER — Encounter: Payer: Self-pay | Admitting: Orthopaedic Surgery

## 2021-06-21 VITALS — BP 106/66 | HR 97

## 2021-06-21 DIAGNOSIS — M25512 Pain in left shoulder: Secondary | ICD-10-CM | POA: Diagnosis not present

## 2021-06-21 DIAGNOSIS — I10 Essential (primary) hypertension: Secondary | ICD-10-CM | POA: Diagnosis not present

## 2021-06-21 DIAGNOSIS — E113593 Type 2 diabetes mellitus with proliferative diabetic retinopathy without macular edema, bilateral: Secondary | ICD-10-CM | POA: Diagnosis not present

## 2021-06-21 DIAGNOSIS — S42332S Displaced oblique fracture of shaft of humerus, left arm, sequela: Secondary | ICD-10-CM | POA: Diagnosis not present

## 2021-06-21 LAB — CBC WITH DIFFERENTIAL/PLATELET
Absolute Monocytes: 1221 cells/uL — ABNORMAL HIGH (ref 200–950)
Basophils Absolute: 56 cells/uL (ref 0–200)
Basophils Relative: 0.5 %
Eosinophils Absolute: 336 cells/uL (ref 15–500)
Eosinophils Relative: 3 %
HCT: 32.4 % — ABNORMAL LOW (ref 35.0–45.0)
Hemoglobin: 10.7 g/dL — ABNORMAL LOW (ref 11.7–15.5)
Lymphs Abs: 3024 cells/uL (ref 850–3900)
MCH: 30.7 pg (ref 27.0–33.0)
MCHC: 33 g/dL (ref 32.0–36.0)
MCV: 92.8 fL (ref 80.0–100.0)
MPV: 10.2 fL (ref 7.5–12.5)
Monocytes Relative: 10.9 %
Neutro Abs: 6563 cells/uL (ref 1500–7800)
Neutrophils Relative %: 58.6 %
Platelets: 295 10*3/uL (ref 140–400)
RBC: 3.49 10*6/uL — ABNORMAL LOW (ref 3.80–5.10)
RDW: 12.2 % (ref 11.0–15.0)
Total Lymphocyte: 27 %
WBC: 11.2 10*3/uL — ABNORMAL HIGH (ref 3.8–10.8)

## 2021-06-21 NOTE — Progress Notes (Signed)
Office Visit Note   Patient: Julie Baxter           Date of Birth: 01/06/1936           MRN: [id number] Visit Date: 06/21/2021              Requested by: Sandrea Hughs, NP [address] [place],  [place] [address] PCP: Lauree Chandler, NP   Assessment & Plan: Visit Diagnoses:  1. Closed displaced oblique fracture of shaft of left humerus, sequela     Plan: She can gradually work on elephant swings progressive circles gradual active assistive gentle range of motion using a broom or a stick.  Use of rope over the top of the door pulley discussed.  Recheck 1 month and likely final x-rays proximal humerus at that time.  Follow-Up Instructions: No follow-ups on file.   Orders:  Orders Placed This Encounter  Procedures   XR Humerus Left   No orders of the defined types were placed in this encounter.     Procedures: No procedures performed   Clinical Data: No additional findings.   Subjective: Chief Complaint  Patient presents with   Left Shoulder - Follow-up    HPI left humeral shaft fracture treated closed with Sarmiento bracing.  She had some popping but this is resolved.  She had some skin problems has been out of her brace just using the sling.  She has less pain and has 40 degrees shoulder flexion and abduction without pain.  Review of Systems all the systems are unchanged.   Objective: Vital Signs: BP 106/66 (BP Location: Right Arm, Patient Position: Sitting, Cuff Size: Normal)   Pulse 97   SpO2 98%   Physical Exam unchanged.  Ortho Exam proximal and distal humerus move is 1 unit with flexion extension and with rotation.  Sensation of the hand is intact.  Good finger flexion extension.  Specialty Comments:  No specialty comments available.  Imaging: No results found.   PMFS History: Patient Active Problem List   Diagnosis Date Noted   Closed displaced oblique fracture of shaft of left humerus 05/30/2021   Posterior vitreous detachment of  both eyes 07/15/2020   Controlled type 2 diabetes mellitus with stable proliferative retinopathy of both eyes, without long-term current use of insulin ([facility]) 07/15/2020   Pseudophakia, both eyes 07/15/2020   Orthostatic hypotension 12/12/2018   Advance care planning 09/13/2017   Orthostatic dizziness 12/16/2015   Diabetes mellitus due to underlying condition with retinopathy ([place]) 12/16/2015   Hypertriglyceridemia 12/16/2015   Post-traumatic osteoarthritis of right knee 12/16/2015   Essential hypertension 12/16/2015   Osteoarthritis of left hip 06/01/2015   Status post total replacement of left hip 06/01/2015   Dizzy spells 12/09/2014   Cystic thyroid nodule 12/09/2014   Generalized anxiety disorder 11/25/2014   Diabetic retinopathy ([place])    Other and unspecified hyperlipidemia    Degenerative arthritis of hip 11/29/2012   Past Medical History:  Diagnosis Date   Anxiety    regarding surgery; occassionally pt. states   Anxiety state, unspecified    Arthritis    Asymptomatic varicose veins    Broken arm    Chronic kidney disease    Diabetic retinopathy (HCC)    Edema    Hard of hearing    Hyperpotassemia    Hypertension    Inflammation of joint of knee    right knee   Insomnia, unspecified    Multiple thyroid nodules    Osteoarthrosis, unspecified  whether generalized or localized, unspecified site    Other abnormal blood chemistry    Other and unspecified hyperlipidemia    Other malaise and fatigue    Other specified disease of nail    Overweight(278.02)    Pain in joint, lower leg    Pain in joint, pelvic region and thigh    Peripheral vascular disease ([place])    varicose veins-  "Blood Clot anterior left LEG WITH PREGNANCY"   PONV (postoperative nausea and vomiting)    pt. states that she was sick after hip surgery and oral surgery   Type II or unspecified type diabetes mellitus with renal manifestations, not stated as uncontrolled(250.40)    Undiagnosed cardiac  murmurs    Unspecified disorder of kidney and ureter    Unspecified disorder of kidney and ureter    Urinary tract infection, site not specified     History reviewed. No pertinent family history.  Past Surgical History:  Procedure Laterality Date   ABDOMINAL HYSTERECTOMY     EYE SURGERY Bilateral    cataract extraction with IOL   LIPOMA EXCISION     x2  bilateral buttocks   spinal meningitis  1957   TONSILLECTOMY     TOTAL HIP ARTHROPLASTY Right 11/29/2012   Procedure: RIGHT TOTAL HIP ARTHROPLASTY ANTERIOR APPROACH;  Surgeon: Mcarthur Rossetti, MD;  Location: [facility];  Service: Orthopedics;  Laterality: Right;   TOTAL HIP ARTHROPLASTY Left 06/01/2015   Procedure: LEFT TOTAL HIP ARTHROPLASTY ANTERIOR APPROACH;  Surgeon: Mcarthur Rossetti, MD;  Location: [facility];  Service: Orthopedics;  Laterality: Left;   Social History   Occupational History   Not on file  Tobacco Use   Smoking status: Never   Smokeless tobacco: Never  Vaping Use   Vaping Use: Never used  Substance and Sexual Activity   Alcohol use: No   Drug use: No   Sexual activity: Not on file

## 2021-06-22 ENCOUNTER — Ambulatory Visit (INDEPENDENT_AMBULATORY_CARE_PROVIDER_SITE_OTHER): Payer: Medicare Other | Admitting: Family

## 2021-06-22 ENCOUNTER — Encounter: Payer: Self-pay | Admitting: Family

## 2021-06-22 ENCOUNTER — Encounter: Payer: Self-pay | Admitting: *Deleted

## 2021-06-22 DIAGNOSIS — D72829 Elevated white blood cell count, unspecified: Secondary | ICD-10-CM | POA: Diagnosis not present

## 2021-06-22 DIAGNOSIS — I951 Orthostatic hypotension: Secondary | ICD-10-CM

## 2021-06-22 DIAGNOSIS — S42332D Displaced oblique fracture of shaft of humerus, left arm, subsequent encounter for fracture with routine healing: Secondary | ICD-10-CM | POA: Diagnosis not present

## 2021-06-22 NOTE — Progress Notes (Signed)
This service is provided via telemedicine  No vital signs collected/recorded due to the encounter was a telemedicine visit.   Location of patient (ex: home, work):  Home  Patient consents to a telephone visit:  Yes  Location of the provider (ex: office, home):  [employer].  Name of any referring provider:  Lauree Chandler, NP   Names of all persons participating in the telemedicine service and their role in the encounter:  Patient, Heriberto Antigua, [place], [place], Webb Silversmith, NP.    Time spent on call: 8 minutes spent on the phone with Medical Assistant.     Provider: Fatumata Kashani FNP-C  Lauree Chandler, NP  Patient Care Team: Lauree Chandler, NP as PCP - General (Geriatric Medicine) Hurman Horn, MD as Consulting Physician (Ophthalmology)  Extended Emergency Contact Information Primary Emergency Contact: [facility] Address: [place], [place] [address] [place] Phone: [phone] Mobile Phone: [phone] Relation: Daughter Secondary Emergency Contact: Siemen,[name]  [place] Phone: [phone] Relation: Son  Code Status:  DNR Goals of care: Advanced Directive information Advanced Directives 06/22/2021  Does Patient Have a Medical Advance Directive? Yes  Type of Advance Directive Out of facility DNR (pink MOST or yellow form)  Does patient want to make changes to medical advance directive? No - Patient declined  Copy of [place] in Chart? -  Would patient like information on creating a medical advance directive? -  Pre-existing out of facility DNR order (yellow form or pink MOST form) -     Chief Complaint  Patient presents with   Acute Visit    Patient wants to discuss heart monitor and labs.     HPI:  Pt is a 85 y.o. female seen today for an acute visit for evaluation of lab results and heart monitor.states was seen yesterday 06/21/2021 by  Orthopedic Dr.Mark [facility]in [facility] for closed displaced left arm fracture no surgery recommended.patient very excited states " feel like a big load is off my shoulder".Also states no Physical Therapy recommended to prevent over working the arm.she will continue with sling and let fracture heal on it's own.on chart review Dr.Yates note is incomplete unable to review.   Her CBC done 06/03/2021 WBC was 11.5 thought to be reactive after the fracture. CBC was repeated yesterday WBC 11.2.she denies any erythema,swelling or warm to touch on shoulder/arm fracture site.Also denies any fever or chills.sh denies any cough,runny nose or symptoms of urinary tract infection.   Also states cancelled her recent cardiology referral appointment due to orthostatic hypotension.states cancelled appointment since she had a lot of follow up for her left arm fracture was worried about surgery and doing physical therapy.also dealing with her Husband.states was advised to rescheduled appointment and was also given an option to follow up with Hypertension clinic.Heart monitor was recommended by Hypertension clinic.she was told to obtain heart monitor order from PCP then it will be send by mail. Cardiologist office note on referral noted.referral has been closed per pt's request will patient will call back with Cardiologist Hypertension clinic number.    Past Medical History:  Diagnosis Date   Anxiety    regarding surgery; occassionally pt. states   Anxiety state, unspecified    Arthritis    Asymptomatic varicose veins    Broken arm    Chronic kidney disease    Diabetic retinopathy (HCC)    Edema  Hard of hearing    Hyperpotassemia    Hypertension    Inflammation of joint of knee    right knee   Insomnia, unspecified    Multiple thyroid nodules    Osteoarthrosis, unspecified whether generalized or localized, unspecified site    Other abnormal blood chemistry    Other and unspecified hyperlipidemia    Other malaise and  fatigue    Other specified disease of nail    Overweight(278.02)    Pain in joint, lower leg    Pain in joint, pelvic region and thigh    Peripheral vascular disease ([place])    varicose veins-  "Blood Clot anterior left LEG WITH PREGNANCY"   PONV (postoperative nausea and vomiting)    pt. states that she was sick after hip surgery and oral surgery   Type II or unspecified type diabetes mellitus with renal manifestations, not stated as uncontrolled(250.40)    Undiagnosed cardiac murmurs    Unspecified disorder of kidney and ureter    Unspecified disorder of kidney and ureter    Urinary tract infection, site not specified    Past Surgical History:  Procedure Laterality Date   ABDOMINAL HYSTERECTOMY     EYE SURGERY Bilateral    cataract extraction with IOL   LIPOMA EXCISION     x2  bilateral buttocks   spinal meningitis  1957   TONSILLECTOMY     TOTAL HIP ARTHROPLASTY Right 11/29/2012   Procedure: RIGHT TOTAL HIP ARTHROPLASTY ANTERIOR APPROACH;  Surgeon: Mcarthur Rossetti, MD;  Location: [facility];  Service: Orthopedics;  Laterality: Right;   TOTAL HIP ARTHROPLASTY Left 06/01/2015   Procedure: LEFT TOTAL HIP ARTHROPLASTY ANTERIOR APPROACH;  Surgeon: Mcarthur Rossetti, MD;  Location: [facility];  Service: Orthopedics;  Laterality: Left;    Allergies  Allergen Reactions   Elemental Sulfur Hives   Naproxen Swelling and Other (See Comments)    Blacked out - Face swelling    Tramadol Nausea And Vomiting    Outpatient Encounter Medications as of 06/22/2021  Medication Sig   ACCU-CHEK FASTCLIX LANCETS MISC Test blood sugar twice daily DX E08.319   aspirin EC 81 MG tablet Take 81 mg by mouth daily.   diclofenac Sodium (VOLTAREN) 1 % GEL Apply 2 g topically 4 (four) times daily as needed.   furosemide (LASIX) 20 MG tablet Take 20 mg by mouth daily.   glipiZIDE (GLUCOTROL) 5 MG tablet Take one tablet by mouth twice daily before meals (breakfast and lunch)   glucose blood (ACCU-CHEK  SMARTVIEW) test strip USE TO CHECK BLOOD SUGAR  TWO TIMES DAILY Dx:E11.3593   metFORMIN (GLUCOPHAGE) 500 MG tablet Take 2 tablets (1,000 mg total) by mouth 2 (two) times daily with a meal.   omeprazole (PRILOSEC OTC) 20 MG tablet Take 1 tablet (20 mg total) by mouth daily before supper.   Propylene Glycol (SYSTANE BALANCE OP) Apply 1 drop to eye daily as needed (dry eyes).   simvastatin (ZOCOR) 40 MG tablet Take 1 tablet (40 mg total) by mouth daily.   No facility-administered encounter medications on file as of 06/22/2021.    Review of Systems  Constitutional:  Negative for appetite change, chills, fatigue, fever and unexpected weight change.  HENT:  Negative for congestion, dental problem, ear discharge, ear pain, facial swelling, hearing loss, nosebleeds, postnasal drip, rhinorrhea, sinus pressure, sinus pain, sneezing and sore throat.   Eyes:  Negative for pain, discharge, redness, itching and visual disturbance.  Respiratory:  Negative for cough, chest tightness, shortness  of breath and wheezing.   Cardiovascular:  Negative for chest pain, palpitations and leg swelling.  Gastrointestinal:  Negative for abdominal distention, abdominal pain, constipation, diarrhea, nausea and vomiting.  Genitourinary:  Negative for difficulty urinating, dysuria, flank pain, frequency and urgency.  Musculoskeletal:  Positive for arthralgias. Negative for back pain, gait problem, joint swelling, myalgias and neck pain.       Left arm fracture   Skin:  Negative for color change, pallor, rash and wound.  Neurological:  Negative for dizziness, syncope, speech difficulty, weakness, light-headedness, numbness and headaches.  Hematological:  Does not bruise/bleed easily.  Psychiatric/Behavioral:  Negative for agitation, behavioral problems, confusion, hallucinations and sleep disturbance. The patient is not nervous/anxious.    Immunization History  Administered Date(s) Administered   Fluad Quad(high Dose 65+)  06/19/2019   Influenza Whole 05/28/2012   Influenza, High Dose Seasonal PF 06/13/2018   Influenza,inj,Quad PF,6+ Mos 07/17/2016   Influenza-Unspecified 07/02/2017   Pneumococcal Conjugate-13 11/06/2014   Pneumococcal Polysaccharide-23 02/23/2017   Pertinent  Health Maintenance Due  Topic Date Due   OPHTHALMOLOGY EXAM  10/23/2018   FOOT EXAM  06/18/2020   INFLUENZA VACCINE  03/28/2021   DEXA SCAN  08/29/2023 (Originally 04/12/2003)   HEMOGLOBIN A1C  12/02/2021   Fall Risk 12/12/2018 10/11/2020 03/26/2021 06/03/2021 06/22/2021  Falls in the past year? 0 0 - 1 1  Was there an injury with Fall? 0 0 - 1 1  Fall Risk Category Calculator 0 0 - 3 3  Fall Risk Category Low Low - High High  Patient Fall Risk Level Low fall risk Low fall risk Low fall risk High fall risk High fall risk  Patient at Risk for Falls Due to - - - History of fall(s) History of fall(s)  Fall risk Follow up - - - Falls evaluation completed Falls evaluation completed;Education provided;Falls prevention discussed   Functional Status Survey:    There were no vitals filed for this visit. There is no height or weight on file to calculate BMI. Physical Exam Unable to complete on telephone visit   Labs reviewed: Recent Labs    10/06/20 0828 06/03/21 1013  NA 140 137  K 4.6 4.9  CL 105 101  CO2 25 24  GLUCOSE 86 150*  BUN 24 27*  CREATININE 1.63* 1.69*  CALCIUM 9.5 9.3   Recent Labs    10/06/20 0828 06/03/21 1013  AST 12 13  ALT 7 8  BILITOT 0.4 0.3  PROT 6.8 6.6   Recent Labs    10/06/20 0828 06/03/21 1013 06/21/21 0848  WBC 10.5 11.5* 11.2*  NEUTROABS 6,458 7,475 6,563  HGB 12.3 10.8* 10.7*  HCT 37.1 32.7* 32.4*  MCV 91.6 92.4 92.8  PLT 279 312 295   Lab Results  Component Value Date   TSH 1.07 06/13/2018   Lab Results  Component Value Date   HGBA1C 7.7 (H) 06/03/2021   Lab Results  Component Value Date   CHOL 116 06/03/2021   HDL 47 (L) 06/03/2021   LDLCALC 45 06/03/2021   TRIG 163  (H) 06/03/2021   CHOLHDL 2.5 06/03/2021    Significant Diagnostic Results in last 30 days:  Color Fundus Photography Optos - OU - Both Eyes  Result Date: 05/26/2021 Right Eye Progression has been stable. Macula : microaneurysms. Left Eye Progression has been stable. Disc findings include normal observations. Macula : microaneurysms. Notes Good focal laser scars temporally left eye Quiescent PDR, we was only early PDR in the past,, watch anteriorly OS  Good PRP nasally OU stable Clear media OU  XR Humerus Left  Result Date: 05/30/2021 Multiple views left humerus demonstrate spiral oblique fracture humeral shaft proximal third with some progressive callus formation comparison 825 and 05/10/2021 images.  Increased periosteal bone formation but she does not appear to have bridged. Impression: Humeral shaft fracture with some healing noted.  Obvious union has not been achieved radiographically.   Assessment/Plan 1. Closed displaced oblique fracture of shaft of left humerus with routine healing, subsequent encounter - continue with current pain regimen  - continue to wear sling  - follow up with Orthopedic as directed   2. Orthostatic hypotension Cancelled previous referral to Orthopedic due to several other medical appointment and Husband's health issues.Request for cardiac Heart monitor then will reschedule cardiologist appointment if indicated. She call back our office with Hypertension clinic to send orders for heart monitor tel # [phone].call and confirmed by provider.  - Cardiac event monitor; Future  3. Leukocytosis  WBC 11.5 > 11.2  Trending down suspect possible reactive post fall  Denies any signs of URI's or UTI will notify provider for any signs of infection or erythema or swelling on fracture site.  - continue to monitor   Family/ staff Communication: Reviewed plan of care with patient verbalized understanding.   Labs/tests ordered: None   Next Appointment: As needed if  symptoms worsen or fail to improve   I connected with  Jerra S Marcantonio on 06/22/21 by a Telephone enabled telemedicine application and verified that I am speaking with the correct person using two identifiers.   I discussed the limitations of evaluation and management by telemedicine. The patient expressed understanding and agreed to proceed.  Spent 12 minutes of non-face to face with patient  >50% time spent counseling; reviewing medical record; tests; labs; and developing future plan of care.     Sandrea Hughs, NP

## 2021-06-22 NOTE — Progress Notes (Signed)
Patient ID: Julie Baxter, female   DOB: [birth year], 85 y.o.   MRN: [id number] Patient enrolled for [employer] to ship a 30 day cardiac event monitor to her home. Letter with instructions mailed to patient.

## 2021-06-22 NOTE — Progress Notes (Signed)
Patient ID: Julie Baxter, female   DOB: 1936-03-12, 85 y.o.   MRN: [id number] Patient enrolled for [employer] to ship a 30 day cardiac event monitor to her home. Letter with instructions mailed to patient.

## 2021-06-30 ENCOUNTER — Telehealth: Payer: Self-pay | Admitting: Nurse Practitioner

## 2021-06-30 NOTE — Telephone Encounter (Signed)
Called, left a vm in hopes to go ahead and schedule that AWV.

## 2021-07-04 ENCOUNTER — Ambulatory Visit (INDEPENDENT_AMBULATORY_CARE_PROVIDER_SITE_OTHER): Payer: Medicare Other

## 2021-07-04 DIAGNOSIS — R42 Dizziness and giddiness: Secondary | ICD-10-CM | POA: Diagnosis not present

## 2021-07-04 DIAGNOSIS — I951 Orthostatic hypotension: Secondary | ICD-10-CM | POA: Diagnosis not present

## 2021-07-04 DIAGNOSIS — I493 Ventricular premature depolarization: Secondary | ICD-10-CM

## 2021-07-07 ENCOUNTER — Other Ambulatory Visit: Payer: Self-pay | Admitting: Nurse Practitioner

## 2021-07-14 ENCOUNTER — Ambulatory Visit: Payer: Medicare Other | Admitting: Internal Medicine

## 2021-07-19 ENCOUNTER — Ambulatory Visit: Payer: Medicare Other | Admitting: Orthopaedic Surgery

## 2021-07-19 ENCOUNTER — Ambulatory Visit: Payer: Self-pay

## 2021-07-19 ENCOUNTER — Encounter: Payer: Self-pay | Admitting: Orthopaedic Surgery

## 2021-07-19 ENCOUNTER — Other Ambulatory Visit: Payer: Self-pay

## 2021-07-19 VITALS — BP 122/75 | HR 85

## 2021-07-19 DIAGNOSIS — M25512 Pain in left shoulder: Secondary | ICD-10-CM

## 2021-07-19 DIAGNOSIS — S42332S Displaced oblique fracture of shaft of humerus, left arm, sequela: Secondary | ICD-10-CM

## 2021-07-19 NOTE — Progress Notes (Signed)
Post-Op Visit Note   Patient: Julie Baxter           Date of Birth: 05-13-1936           MRN: [id number] Visit Date: 07/19/2021 PCP: Lauree Chandler, NP   Assessment & Plan: Patient returns she has been able to fix her hair she can reach arm out to the side can reach past the posterior axillary line.  No motion at the fracture site.  X-rays demonstrate progressive interval healing.  Return for final x-rays in 3 months.  She is happy with the results of treatment.  Chief Complaint:  Chief Complaint  Patient presents with   Left Shoulder - Follow-up   Visit Diagnoses:  1. Closed displaced oblique fracture of shaft of left humerus, sequela     Plan: ROV 3 months  Follow-Up Instructions: No follow-ups on file.   Orders:  Orders Placed This Encounter  Procedures   XR Humerus Left   No orders of the defined types were placed in this encounter.   Imaging: No results found.  PMFS History: Patient Active Problem List   Diagnosis Date Noted   Closed displaced oblique fracture of shaft of left humerus 05/30/2021   Posterior vitreous detachment of both eyes 07/15/2020   Controlled type 2 diabetes mellitus with stable proliferative retinopathy of both eyes, without long-term current use of insulin ([place]) 07/15/2020   Pseudophakia, both eyes 07/15/2020   Orthostatic hypotension 12/12/2018   Advance care planning 09/13/2017   Orthostatic dizziness 12/16/2015   Diabetes mellitus due to underlying condition with retinopathy ([place]) 12/16/2015   Hypertriglyceridemia 12/16/2015   Post-traumatic osteoarthritis of right knee 12/16/2015   Essential hypertension 12/16/2015   Osteoarthritis of left hip 06/01/2015   Status post total replacement of left hip 06/01/2015   Dizzy spells 12/09/2014   Cystic thyroid nodule 12/09/2014   Generalized anxiety disorder 11/25/2014   Diabetic retinopathy ([place])    Other and unspecified hyperlipidemia    Degenerative arthritis of hip  11/29/2012   Past Medical History:  Diagnosis Date   Anxiety    regarding surgery; occassionally pt. states   Anxiety state, unspecified    Arthritis    Asymptomatic varicose veins    Broken arm    Chronic kidney disease    Diabetic retinopathy (HCC)    Edema    Hard of hearing    Hyperpotassemia    Hypertension    Inflammation of joint of knee    right knee   Insomnia, unspecified    Multiple thyroid nodules    Osteoarthrosis, unspecified whether generalized or localized, unspecified site    Other abnormal blood chemistry    Other and unspecified hyperlipidemia    Other malaise and fatigue    Other specified disease of nail    Overweight(278.02)    Pain in joint, lower leg    Pain in joint, pelvic region and thigh    Peripheral vascular disease ([place])    varicose veins-  "Blood Clot anterior left LEG WITH PREGNANCY"   PONV (postoperative nausea and vomiting)    pt. states that she was sick after hip surgery and oral surgery   Type II or unspecified type diabetes mellitus with renal manifestations, not stated as uncontrolled(250.40)    Undiagnosed cardiac murmurs    Unspecified disorder of kidney and ureter    Unspecified disorder of kidney and ureter    Urinary tract infection, site not specified     History reviewed. No pertinent family  history.  Past Surgical History:  Procedure Laterality Date   ABDOMINAL HYSTERECTOMY     EYE SURGERY Bilateral    cataract extraction with IOL   LIPOMA EXCISION     x2  bilateral buttocks   spinal meningitis  1957   TONSILLECTOMY     TOTAL HIP ARTHROPLASTY Right 11/29/2012   Procedure: RIGHT TOTAL HIP ARTHROPLASTY ANTERIOR APPROACH;  Surgeon: Mcarthur Rossetti, MD;  Location: [facility];  Service: Orthopedics;  Laterality: Right;   TOTAL HIP ARTHROPLASTY Left 06/01/2015   Procedure: LEFT TOTAL HIP ARTHROPLASTY ANTERIOR APPROACH;  Surgeon: Mcarthur Rossetti, MD;  Location: [facility];  Service: Orthopedics;  Laterality: Left;    Social History   Occupational History   Not on file  Tobacco Use   Smoking status: Never   Smokeless tobacco: Never  Vaping Use   Vaping Use: Never used  Substance and Sexual Activity   Alcohol use: No   Drug use: No   Sexual activity: Not on file

## 2021-07-26 ENCOUNTER — Ambulatory Visit: Payer: Medicare Other | Admitting: Orthopaedic Surgery

## 2021-07-28 ENCOUNTER — Other Ambulatory Visit: Payer: Self-pay | Admitting: Family

## 2021-07-28 DIAGNOSIS — R42 Dizziness and giddiness: Secondary | ICD-10-CM

## 2021-07-28 DIAGNOSIS — I951 Orthostatic hypotension: Secondary | ICD-10-CM

## 2021-07-28 DIAGNOSIS — I493 Ventricular premature depolarization: Secondary | ICD-10-CM

## 2021-08-16 ENCOUNTER — Telehealth: Payer: Self-pay | Admitting: *Deleted

## 2021-08-16 NOTE — Telephone Encounter (Signed)
No heart monitor results received.

## 2021-08-16 NOTE — Telephone Encounter (Signed)
Patient called and stated that she turned in a Heart Monitor 2 weeks ago. Stated that we ordered the testing to be done because of Hypotension. Ordered by Dinah on 06/22/2021.  Patient is wanting to know if you have received the results. Was wanting to know something before Christmas.   Please Advise.

## 2021-08-17 NOTE — Telephone Encounter (Signed)
Patient notified

## 2021-08-25 NOTE — Telephone Encounter (Signed)
Patient notified and agreed.  Wants to discuss with Janett Billow at next appointment before making Cardiologist appointment.

## 2021-08-25 NOTE — Telephone Encounter (Signed)
Scanned results indicates episodes of high heart rate ( sinus Tachycardia ) and some were normal.Recommend follow up with Cardiologist for further evaluation.

## 2021-08-25 NOTE — Telephone Encounter (Addendum)
Patient called and wanted to know if you have received her Holter Monitor Results yet. Would like the results. Stated it was ordered by [place] in October.   Report is under Media dated 07/28/2021  Please Advise.

## 2021-09-08 ENCOUNTER — Other Ambulatory Visit: Payer: Self-pay

## 2021-09-08 ENCOUNTER — Telehealth: Payer: Self-pay

## 2021-09-08 ENCOUNTER — Ambulatory Visit (INDEPENDENT_AMBULATORY_CARE_PROVIDER_SITE_OTHER): Payer: Medicare Other | Admitting: Nurse Practitioner

## 2021-09-08 ENCOUNTER — Encounter: Payer: Self-pay | Admitting: Nurse Practitioner

## 2021-09-08 DIAGNOSIS — Z0000 Encounter for general adult medical examination without abnormal findings: Secondary | ICD-10-CM | POA: Diagnosis not present

## 2021-09-08 NOTE — Patient Instructions (Signed)
[name] , Thank you for taking time to come for your Medicare Wellness Visit. I appreciate your ongoing commitment to your health goals. Please review the following plan we discussed and let me know if I can assist you in the future.   Screening recommendations/referrals: Colonoscopy aged out Mammogram aged out Bone Density declined Recommended yearly ophthalmology/optometry visit for glaucoma screening and checkup Recommended yearly dental visit for hygiene and checkup  Vaccinations: Influenza vaccine RECOMMEND to get annually  Pneumococcal vaccine up to date Tdap vaccine RECOMMENDED to get at your local pharmacy  Shingles vaccine RECOMMENDED to get at your local pharmacy     Advanced directives: DNR on file. Recommend to complete advance directive/most   Conditions/risks identified: advanced age, complications related to diabetes  Next appointment: yearly for AWV   Preventive Care 51 Years and Older, Female Preventive care refers to lifestyle choices and visits with your health care provider that can promote health and wellness. What does preventive care include? A yearly physical exam. This is also called an annual well check. Dental exams once or twice a year. Routine eye exams. Ask your health care provider how often you should have your eyes checked. Personal lifestyle choices, including: Daily care of your teeth and gums. Regular physical activity. Eating a healthy diet. Avoiding tobacco and drug use. Limiting alcohol use. Practicing safe sex. Taking low-dose aspirin every day. Taking vitamin and mineral supplements as recommended by your health care provider. What happens during an annual well check? The services and screenings done by your health care provider during your annual well check will depend on your age, overall health, lifestyle risk factors, and family history of disease. Counseling  Your health care provider may ask you questions about  your: Alcohol use. Tobacco use. Drug use. Emotional well-being. Home and relationship well-being. Sexual activity. Eating habits. History of falls. Memory and ability to understand (cognition). Work and work Statistician. Reproductive health. Screening  You may have the following tests or measurements: Height, weight, and BMI. Blood pressure. Lipid and cholesterol levels. These may be checked every 5 years, or more frequently if you are over 66 years old. Skin check. Lung cancer screening. You may have this screening every year starting at age 26 if you have a 30-pack-year history of smoking and currently smoke or have quit within the past 15 years. Fecal occult blood test (FOBT) of the stool. You may have this test every year starting at age 62. Flexible sigmoidoscopy or colonoscopy. You may have a sigmoidoscopy every 5 years or a colonoscopy every 10 years starting at age 4. Hepatitis C blood test. Hepatitis B blood test. Sexually transmitted disease (STD) testing. Diabetes screening. This is done by checking your blood sugar (glucose) after you have not eaten for a while (fasting). You may have this done every 1-3 years. Bone density scan. This is done to screen for osteoporosis. You may have this done starting at age 59. Mammogram. This may be done every 1-2 years. Talk to your health care provider about how often you should have regular mammograms. Talk with your health care provider about your test results, treatment options, and if necessary, the need for more tests. Vaccines  Your health care provider may recommend certain vaccines, such as: Influenza vaccine. This is recommended every year. Tetanus, diphtheria, and acellular pertussis (Tdap, Td) vaccine. You may need a Td booster every 10 years. Zoster vaccine. You may need this after age 58. Pneumococcal 13-valent conjugate (PCV13) vaccine. One dose is recommended  after age 3. Pneumococcal polysaccharide (PPSV23) vaccine.  One dose is recommended after age 52. Talk to your health care provider about which screenings and vaccines you need and how often you need them. This information is not intended to replace advice given to you by your health care provider. Make sure you discuss any questions you have with your health care provider. Document Released: 09/10/2015 Document Revised: 05/03/2016 Document Reviewed: 06/15/2015 Elsevier Interactive Patient Education  2017 [employer] Prevention in the Home Falls can cause injuries. They can happen to people of all ages. There are many things you can do to make your home safe and to help prevent falls. What can I do on the outside of my home? Regularly fix the edges of walkways and driveways and fix any cracks. Remove anything that might make you trip as you walk through a door, such as a raised step or threshold. Trim any bushes or trees on the path to your home. Use bright outdoor lighting. Clear any walking paths of anything that might make someone trip, such as rocks or tools. Regularly check to see if handrails are loose or broken. Make sure that both sides of any steps have handrails. Any raised decks and porches should have guardrails on the edges. Have any leaves, snow, or ice cleared regularly. Use sand or salt on walking paths during winter. Clean up any spills in your garage right away. This includes oil or grease spills. What can I do in the bathroom? Use night lights. Install grab bars by the toilet and in the tub and shower. Do not use towel bars as grab bars. Use non-skid mats or decals in the tub or shower. If you need to sit down in the shower, use a plastic, non-slip stool. Keep the floor dry. Clean up any water that spills on the floor as soon as it happens. Remove soap buildup in the tub or shower regularly. Attach bath mats securely with double-sided non-slip rug tape. Do not have throw rugs and other things on the floor that can make  you trip. What can I do in the bedroom? Use night lights. Make sure that you have a light by your bed that is easy to reach. Do not use any sheets or blankets that are too big for your bed. They should not hang down onto the floor. Have a firm chair that has side arms. You can use this for support while you get dressed. Do not have throw rugs and other things on the floor that can make you trip. What can I do in the kitchen? Clean up any spills right away. Avoid walking on wet floors. Keep items that you use a lot in easy-to-reach places. If you need to reach something above you, use a strong step stool that has a grab bar. Keep electrical cords out of the way. Do not use floor polish or wax that makes floors slippery. If you must use wax, use non-skid floor wax. Do not have throw rugs and other things on the floor that can make you trip. What can I do with my stairs? Do not leave any items on the stairs. Make sure that there are handrails on both sides of the stairs and use them. Fix handrails that are broken or loose. Make sure that handrails are as long as the stairways. Check any carpeting to make sure that it is firmly attached to the stairs. Fix any carpet that is loose or worn. Avoid having throw  rugs at the top or bottom of the stairs. If you do have throw rugs, attach them to the floor with carpet tape. Make sure that you have a light switch at the top of the stairs and the bottom of the stairs. If you do not have them, ask someone to add them for you. What else can I do to help prevent falls? Wear shoes that: Do not have high heels. Have rubber bottoms. Are comfortable and fit you well. Are closed at the toe. Do not wear sandals. If you use a stepladder: Make sure that it is fully opened. Do not climb a closed stepladder. Make sure that both sides of the stepladder are locked into place. Ask someone to hold it for you, if possible. Clearly mark and make sure that you can  see: Any grab bars or handrails. First and last steps. Where the edge of each step is. Use tools that help you move around (mobility aids) if they are needed. These include: Canes. Walkers. Scooters. Crutches. Turn on the lights when you go into a dark area. Replace any light bulbs as soon as they burn out. Set up your furniture so you have a clear path. Avoid moving your furniture around. If any of your floors are uneven, fix them. If there are any pets around you, be aware of where they are. Review your medicines with your doctor. Some medicines can make you feel dizzy. This can increase your chance of falling. Ask your doctor what other things that you can do to help prevent falls. This information is not intended to replace advice given to you by your health care provider. Make sure you discuss any questions you have with your health care provider. Document Released: 06/10/2009 Document Revised: 01/20/2016 Document Reviewed: 09/18/2014 Elsevier Interactive Patient Education  2017 [employer].

## 2021-09-08 NOTE — Progress Notes (Signed)
Subjective:   Julie Baxter is a 86 y.o. female who presents for Medicare Annual (Subsequent) preventive examination.  Review of Systems     Cardiac Risk Factors include: advanced age (>69men, >68 women);diabetes mellitus;hypertension;dyslipidemia;sedentary lifestyle     Objective:    There were no vitals filed for this visit. There is no height or weight on file to calculate BMI.  Advanced Directives 09/08/2021 06/22/2021 06/03/2021 03/26/2021 10/11/2020 02/09/2020 06/18/2018  Does Patient Have a Medical Advance Directive? Yes Yes Yes No Yes Yes Yes  Type of Advance Directive Out of facility DNR (pink MOST or yellow form) Out of facility DNR (pink MOST or yellow form) Out of facility DNR (pink MOST or yellow form) - Out of facility DNR (pink MOST or yellow form) Out of facility DNR (pink MOST or yellow form) Out of facility DNR (pink MOST or yellow form)  Does patient want to make changes to medical advance directive? No - Patient declined No - Patient declined No - Patient declined - No - Patient declined - -  Copy of [place] in [place]  Would patient like information on creating a medical advance directive? - - - - - - -  Pre-existing out of facility DNR order (yellow form or pink MOST form) Yellow form placed in chart (order not valid for inpatient use) - Yellow form placed in chart (order not valid for inpatient use) - - Pink MOST/Yellow Form most recent copy in chart - Physician notified to receive inpatient order Yellow form placed in chart (order not valid for inpatient use)    Current Medications (verified) Outpatient Encounter Medications as of 09/08/2021  Medication Sig   ACCU-CHEK FASTCLIX LANCETS MISC Test blood sugar twice daily DX E08.319   aspirin EC 81 MG tablet Take 81 mg by mouth daily.   diclofenac Sodium (VOLTAREN) 1 % GEL Apply 2 g topically 4 (four) times daily as needed.   glipiZIDE (GLUCOTROL) 5 MG tablet TAKE ONE TABLET BY  MOUTH  TWICE DAILY BEFORE MEALS  (BREAKFAST AND LUNCH)   glucose blood (ACCU-CHEK SMARTVIEW) test strip USE TO CHECK BLOOD SUGAR  TWO TIMES DAILY Dx:E11.3593   metFORMIN (GLUCOPHAGE) 500 MG tablet TAKE 2 TABLETS BY MOUTH  TWICE DAILY WITH MEALS   omeprazole (PRILOSEC OTC) 20 MG tablet Take 1 tablet (20 mg total) by mouth daily before supper.   Propylene Glycol (SYSTANE BALANCE OP) Apply 1 drop to eye daily as needed (dry eyes).   simvastatin (ZOCOR) 40 MG tablet TAKE 1 TABLET BY MOUTH  DAILY   [DISCONTINUED] furosemide (LASIX) 20 MG tablet Take 20 mg by mouth daily.   No facility-administered encounter medications on file as of 09/08/2021.    Allergies (verified) Elemental sulfur, Naproxen, and Tramadol   History: Past Medical History:  Diagnosis Date   Anxiety    regarding surgery; occassionally pt. states   Anxiety state, unspecified    Arthritis    Asymptomatic varicose veins    Broken arm    Chronic kidney disease    Diabetic retinopathy ([place])    Edema    Hard of hearing    Hyperpotassemia    Hypertension    Inflammation of joint of knee    right knee   Insomnia, unspecified    Multiple thyroid nodules    Osteoarthrosis, unspecified whether generalized or localized, unspecified site    Other abnormal blood chemistry    Other and unspecified hyperlipidemia  Other malaise and fatigue    Other specified disease of nail    Overweight(278.02)    Pain in joint, lower leg    Pain in joint, pelvic region and thigh    Peripheral vascular disease ([place])    varicose veins-  "Blood Clot anterior left LEG WITH PREGNANCY"   PONV (postoperative nausea and vomiting)    pt. states that she was sick after hip surgery and oral surgery   Type II or unspecified type diabetes mellitus with renal manifestations, not stated as uncontrolled(250.40)    Undiagnosed cardiac murmurs    Unspecified disorder of kidney and ureter    Unspecified disorder of kidney and ureter    Urinary tract  infection, site not specified    Past Surgical History:  Procedure Laterality Date   ABDOMINAL HYSTERECTOMY     EYE SURGERY Bilateral    cataract extraction with IOL   LIPOMA EXCISION     x2  bilateral buttocks   spinal meningitis  1957   TONSILLECTOMY     TOTAL HIP ARTHROPLASTY Right 11/29/2012   Procedure: RIGHT TOTAL HIP ARTHROPLASTY ANTERIOR APPROACH;  Surgeon: Mcarthur Rossetti, MD;  Location: [facility];  Service: Orthopedics;  Laterality: Right;   TOTAL HIP ARTHROPLASTY Left 06/01/2015   Procedure: LEFT TOTAL HIP ARTHROPLASTY ANTERIOR APPROACH;  Surgeon: Mcarthur Rossetti, MD;  Location: [facility];  Service: Orthopedics;  Laterality: Left;   History reviewed. No pertinent family history. Social History   Socioeconomic History   Marital status: Single    Spouse name: Not on file   Number of children: Not on file   Years of education: Not on file   Highest education level: Not on file  Occupational History   Not on file  Tobacco Use   Smoking status: Never   Smokeless tobacco: Never  Vaping Use   Vaping Use: Never used  Substance and Sexual Activity   Alcohol use: No   Drug use: No   Sexual activity: Not on file  Other Topics Concern   Not on file  Social History Narrative   Not on file   Social Determinants of Health   Financial Resource Strain: Not on file  Food Insecurity: Not on file  Transportation Needs: Not on file  Physical Activity: Not on file  Stress: Not on file  Social Connections: Not on file    Tobacco Counseling Counseling given: Not Answered   Clinical Intake:  Pre-visit preparation completed: Yes  Pain : No/denies pain     BMI - recorded: 27.6 Nutritional Risks: Unintentional weight loss Diabetes: Yes  How often do you need to have someone help you when you read instructions, pamphlets, or other written materials from your doctor or pharmacy?: 1 - Never  Diabetic?yes         Activities of Daily Living In your present  state of health, do you have any difficulty performing the following activities: 09/08/2021  Hearing? N  Vision? N  Difficulty concentrating or making decisions? N  Walking or climbing stairs? N  Dressing or bathing? N  Doing errands, shopping? N  Preparing Food and eating ? N  Using the Toilet? N  In the past six months, have you accidently leaked urine? Y  Do you have problems with loss of bowel control? N  Managing your Medications? N  Managing your Finances? N  Housekeeping or managing your Housekeeping? N  Some recent data might be hidden    Patient Care Team: Lauree Chandler, NP  as PCP - General (Geriatric Medicine) Zadie Rhine, Clent Demark, MD as Consulting Physician (Ophthalmology)  Indicate any recent Medical Services you may have received from other than [facility] providers in the past year (date may be approximate).     Assessment:   This is a routine wellness examination for Julie Baxter.  Hearing/Vision screen Hearing Screening - Comments:: No problems. Vision Screening - Comments:: Patient has no vision problems. Patient had eye exam June 2022. She sees Dr. Zadie Rhine  Dietary issues and exercise activities discussed: Current Exercise Habits: The patient does not participate in regular exercise at present, Exercise limited by: orthopedic condition(s)   Goals Addressed   None    Depression Screen PHQ 2/9 Scores 09/08/2021 10/11/2020 12/12/2018 06/13/2018 06/13/2018 09/13/2017 02/23/2017  PHQ - 2 Score 0 0 0 0 0 0 0  PHQ- 9 Score - - - 0 - - -    Fall Risk Fall Risk  09/08/2021 06/22/2021 06/03/2021 10/11/2020 12/12/2018  Falls in the past year? 1 1 1  0 0  Number falls in past yr: 1 1 1  0 0  Injury with Fall? 1 1 1  0 0  Risk for fall due to : History of fall(s) History of fall(s) History of fall(s) - -  Follow up Falls evaluation completed Falls evaluation completed;Education provided;Falls prevention discussed Falls evaluation completed - -    FALL RISK PREVENTION PERTAINING TO THE  HOME:  Any stairs in or around the home? No  If so, are there any without handrails? No  Home free of loose throw rugs in walkways, pet beds, electrical cords, etc? Yes  Adequate lighting in your home to reduce risk of falls? Yes   ASSISTIVE DEVICES UTILIZED TO PREVENT FALLS:  Life alert? No  Use of a cane, walker or w/c? Yes  Grab bars in the bathroom? Yes  Shower chair or bench in shower? Yes  Elevated toilet seat or a handicapped toilet? No   TIMED UP AND GO:  Was the test performed? No .   Cognitive Function: MMSE - Mini Mental State Exam 02/23/2017 11/06/2014  Orientation to time 5 5  Orientation to Place 5 4  Registration 3 3  Attention/ Calculation 5 5  Recall 2 3  Language- name 2 objects 2 2  Language- repeat 1 1  Language- follow 3 step command 3 3  Language- read & follow direction 1 1  Write a sentence 1 1  Copy design 1 0  Total score 29 28     6CIT Screen 09/08/2021  What Year? 0 points  What month? 0 points  What time? 0 points  Count back from 20 0 points  Months in reverse 0 points  Repeat phrase 6 points  Total Score 6    Immunizations Immunization History  Administered Date(s) Administered   Fluad Quad(high Dose 65+) 06/19/2019   Influenza Whole 05/28/2012   Influenza, High Dose Seasonal PF 06/13/2018   Influenza,inj,Quad PF,6+ Mos 07/17/2016   Influenza-Unspecified 07/02/2017   Moderna Sars-Covid-2 Vaccination 05/21/2020   Pneumococcal Conjugate-13 11/06/2014   Pneumococcal Polysaccharide-23 02/23/2017    TDAP status: Due, Education has been provided regarding the importance of this vaccine. Advised may receive this vaccine at local pharmacy or Health Dept. Aware to provide a copy of the vaccination record if obtained from local pharmacy or Health Dept. Verbalized acceptance and understanding.  Flu Vaccine status: Declined, Education has been provided regarding the importance of this vaccine but patient still declined. Advised may receive  this vaccine at local pharmacy  or Health Dept. Aware to provide a copy of the vaccination record if obtained from local pharmacy or Health Dept. Verbalized acceptance and understanding.  Pneumococcal vaccine status: Up to date  Covid-19 vaccine status: Information provided on how to obtain vaccines.   Qualifies for Shingles Vaccine? Yes   Zostavax completed No   Shingrix Completed?: No.    Education has been provided regarding the importance of this vaccine. Patient has been advised to call insurance company to determine out of pocket expense if they have not yet received this vaccine. Advised may also receive vaccine at local pharmacy or Health Dept. Verbalized acceptance and understanding.  Screening Tests Health Maintenance  Topic Date Due   Zoster Vaccines- Shingrix (1 of 2) Never done   OPHTHALMOLOGY EXAM  10/23/2018   FOOT EXAM  06/18/2020   COVID-19 Vaccine (2 - Moderna series) 06/18/2020   INFLUENZA VACCINE  11/25/2021 (Originally 03/28/2021)   DEXA SCAN  08/29/2023 (Originally 04/12/2003)   HEMOGLOBIN A1C  12/02/2021   Pneumonia Vaccine 88+ Years old  Completed   HPV VACCINES  Aged Out    Health Maintenance  Health Maintenance Due  Topic Date Due   Zoster Vaccines- Shingrix (1 of 2) Never done   OPHTHALMOLOGY EXAM  10/23/2018   FOOT EXAM  06/18/2020   COVID-19 Vaccine (2 - Moderna series) 06/18/2020    Colorectal cancer screening: No longer required.   Mammogram status: No longer required due to age.  Declined bone density   Lung Cancer Screening: (Low Dose CT Chest recommended if Age 18-80 years, 30 pack-year currently smoking OR have quit w/in 15years.) does not qualify.   Lung Cancer Screening Referral: na  Additional Screening:  Hepatitis C Screening: does not qualify  Vision Screening: Recommended annual ophthalmology exams for early detection of glaucoma and other disorders of the eye. Is the patient up to date with their annual eye exam?  Yes  Who is the  provider or what is the name of the office in which the patient attends annual eye exams? [facility] If pt is not established with a provider, would they like to be referred to a provider to establish care? No .   Dental Screening: Recommended annual dental exams for proper oral hygiene  Community Resource Referral / Chronic Care Management: CRR required this visit?  No   CCM required this visit?  No      Plan:     I have personally reviewed and noted the following in the patients chart:   Medical and social history Use of alcohol, tobacco or illicit drugs  Current medications and supplements including opioid prescriptions.  Functional ability and status Nutritional status Physical activity Advanced directives List of other physicians Hospitalizations, surgeries, and ER visits in previous 12 months Vitals Screenings to include cognitive, depression, and falls Referrals and appointments  In addition, I have reviewed and discussed with patient certain preventive protocols, quality metrics, and best practice recommendations. A written personalized care plan for preventive services as well as general preventive health recommendations were provided to patient.     Lauree Chandler, NP   09/08/2021    Virtual Visit via Telephone Note  I connected withNAME@ on 09/08/21 at  2:45 PM EST by telephone and verified that I am speaking with the correct person using two identifiers.  Location: Patient: home Provider: [facility]    I discussed the limitations, risks, security and privacy concerns of performing an evaluation and management service by telephone and the availability of  in person appointments. I also discussed with the patient that there may be a patient responsible charge related to this service. The patient expressed understanding and agreed to proceed.   I discussed the assessment and treatment plan with the patient. The patient was provided an opportunity to ask  questions and all were answered. The patient agreed with the plan and demonstrated an understanding of the instructions.   The patient was advised to call back or seek an in-person evaluation if the symptoms worsen or if the condition fails to improve as anticipated.  I provided 18 minutes of non-face-to-face time during this encounter.  Carlos American. Harle Battiest Avs printed and mailed

## 2021-09-08 NOTE — Telephone Encounter (Signed)
[name], burd are scheduled for a virtual visit with your provider today.    Just as we do with appointments in the office, we must obtain your consent to participate.  Your consent will be active for this visit and any virtual visit you may have with one of our providers in the next 365 days.    If you have a MyChart account, I can also send a copy of this consent to you electronically.  All virtual visits are billed to your insurance company just like a traditional visit in the office.  As this is a virtual visit, video technology does not allow for your provider to perform a traditional examination.  This may limit your provider's ability to fully assess your condition.  If your provider identifies any concerns that need to be evaluated in person or the need to arrange testing such as labs, EKG, etc, we will make arrangements to do so.    Although advances in technology are sophisticated, we cannot ensure that it will always work on either your end or our end.  If the connection with a video visit is poor, we may have to switch to a telephone visit.  With either a video or telephone visit, we are not always able to ensure that we have a secure connection.   I need to obtain your verbal consent now.   Are you willing to proceed with your visit today?   Julie Baxter has provided verbal consent on 09/08/2021 for a virtual visit (video or telephone).   Carroll Kinds, CMA 09/08/2021  2:50 PM

## 2021-09-08 NOTE — Progress Notes (Signed)
This service is provided via telemedicine  No vital signs collected/recorded due to the encounter was a telemedicine visit.   Location of patient (ex: home, work):  Home  Patient consents to a telephone visit:  Yes see, encounter dated 09/08/2021  Location of the provider (ex: office, home):  [place]  Name of any referring provider:  N/A  Names of all persons participating in the telemedicine service and their role in the encounter:  Sherrie Mustache, Nurse Practitioner, Carroll Kinds, CMA, and patient.   Time spent on call:  12 minutes with medical assistant

## 2021-10-18 ENCOUNTER — Ambulatory Visit: Payer: Medicare Other | Admitting: Orthopaedic Surgery

## 2021-11-11 ENCOUNTER — Encounter: Payer: Self-pay | Admitting: Nurse Practitioner

## 2021-11-11 ENCOUNTER — Ambulatory Visit (INDEPENDENT_AMBULATORY_CARE_PROVIDER_SITE_OTHER): Payer: Medicare Other | Admitting: Nurse Practitioner

## 2021-11-11 ENCOUNTER — Other Ambulatory Visit: Payer: Self-pay

## 2021-11-11 VITALS — BP 90/60 | HR 104 | Temp 97.8°F | Ht 64.0 in | Wt 162.0 lb

## 2021-11-11 DIAGNOSIS — E781 Pure hyperglyceridemia: Secondary | ICD-10-CM | POA: Diagnosis not present

## 2021-11-11 DIAGNOSIS — R634 Abnormal weight loss: Secondary | ICD-10-CM | POA: Diagnosis not present

## 2021-11-11 DIAGNOSIS — F411 Generalized anxiety disorder: Secondary | ICD-10-CM

## 2021-11-11 DIAGNOSIS — I951 Orthostatic hypotension: Secondary | ICD-10-CM

## 2021-11-11 DIAGNOSIS — I10 Essential (primary) hypertension: Secondary | ICD-10-CM

## 2021-11-11 DIAGNOSIS — K219 Gastro-esophageal reflux disease without esophagitis: Secondary | ICD-10-CM | POA: Diagnosis not present

## 2021-11-11 DIAGNOSIS — E113553 Type 2 diabetes mellitus with stable proliferative diabetic retinopathy, bilateral: Secondary | ICD-10-CM | POA: Diagnosis not present

## 2021-11-11 MED ORDER — ACCU-CHEK FASTCLIX LANCETS MISC: 11 refills | Status: DC

## 2021-11-11 MED ORDER — ACCU-CHEK SMARTVIEW VI STRP: ORAL_STRIP | 3 refills | Status: DC

## 2021-11-11 NOTE — Patient Instructions (Signed)
Recommend to increase water/fluid intake.  ? ?Make sure you are drinking ensure (low sugar) daily ? ?Increase protein in diet.  ? ?Decrease simple sugars ? ?Recommend complex carbohydrates as part of your diet.  ? ?

## 2021-11-11 NOTE — Progress Notes (Signed)
Careteam: Patient Care Team: Sharon Seller, NP as PCP - General (Geriatric Medicine) [name] [name], MD as Consulting Physician (Ophthalmology)  PLACE OF SERVICE:  [facility]  Advanced Directive information Does Patient Have a Medical Advance Directive?: Yes, Type of Advance Directive: Out of facility DNR (pink MOST or yellow form), Pre-existing out of facility DNR order (yellow form or pink MOST form): Yellow form placed in chart (order not valid for inpatient use), Does patient want to make changes to medical advance directive?: No - Patient declined  Allergies  Allergen Reactions   Elemental Sulfur Hives   Naproxen Swelling and Other (See Comments)    Blacked out - Face swelling    Tramadol Nausea And Vomiting    Chief Complaint  Patient presents with   Medical Management of Chronic Issues    6 month follow-up. Foot exam today. Discuss need for shingrix, covid boosters, and eye exam or post pone if patient refuses. NCIR verified. Patient concerned about weight loss, hearing loss, and blood pressure. Moderate fall risk. Discuss checking b12 level.      HPI: Patient is a 86 y.o. female for a follow up   Hypertension- Bp usually varies. Has orthostatic hypotension. Has to sit to while washing dishes, for showers, going to the store, or any activities. Gets dizzy too. Uses a cane for stability.   Anxiety- Worried about appearance with weight loss and taking care of her husband. Reports family feels like she is frail due to weight loss.   Diabetes- Takes Metformin, glipizide.Checks glucose at home. Yesterday CBG was 130's and 278 this morning. Has been taking 3 (500 mg ) metformin in the morning, one at lunch and one in the evening.  - Has been trying to gain weight by eating more carbohydrates because family has been telling her she was looking frail. Pt is 162 lbs today with a height 5'4". She had been changing her diet and eating less sweets and carbohydrates which has  lead to weight loss.    GERD- Takes Omeprazole 20 mg. No issues  Hypertriglyceridemia- Takes Simvastatin 40 mg. No issues  Up to date with eye exam. Will get shringrix, but not covid  booster   Review of Systems:  Review of Systems  Constitutional:  Negative for fever, malaise/fatigue and weight loss.  Respiratory:  Negative for cough, sputum production and shortness of breath.   Cardiovascular:  Negative for chest pain, palpitations, orthopnea and leg swelling.  Gastrointestinal:  Negative for abdominal pain, constipation, diarrhea, heartburn, nausea and vomiting.  Genitourinary:  Negative for dysuria, frequency and urgency.  Musculoskeletal:  Negative for back pain, falls, myalgias and neck pain.  Neurological:  Positive for dizziness and weakness.  Psychiatric/Behavioral:  The patient is nervous/anxious.    Past Medical History:  Diagnosis Date   Anxiety    regarding surgery; occassionally pt. states   Anxiety state, unspecified    Arthritis    Asymptomatic varicose veins    Broken arm    Chronic kidney disease    Diabetic retinopathy (HCC)    Edema    Hard of hearing    Hyperpotassemia    Hypertension    Inflammation of joint of knee    right knee   Insomnia, unspecified    Multiple thyroid nodules    Osteoarthrosis, unspecified whether generalized or localized, unspecified site    Other abnormal blood chemistry    Other and unspecified hyperlipidemia    Other malaise and fatigue  Other specified disease of nail    Overweight(278.02)    Pain in joint, lower leg    Pain in joint, pelvic region and thigh    Peripheral vascular disease (HCC)    varicose veins-  "Blood Clot anterior left LEG WITH PREGNANCY"   PONV (postoperative nausea and vomiting)    pt. states that she was sick after hip surgery and oral surgery   Type II or unspecified type diabetes mellitus with renal manifestations, not stated as uncontrolled(250.40)    Undiagnosed cardiac murmurs     Unspecified disorder of kidney and ureter    Unspecified disorder of kidney and ureter    Urinary tract infection, site not specified    Past Surgical History:  Procedure Laterality Date   ABDOMINAL HYSTERECTOMY     EYE SURGERY Bilateral    cataract extraction with IOL   LIPOMA EXCISION     x2  bilateral buttocks   spinal meningitis  1957   TONSILLECTOMY     TOTAL HIP ARTHROPLASTY Right 11/29/2012   Procedure: RIGHT TOTAL HIP ARTHROPLASTY ANTERIOR APPROACH;  Surgeon: Kathryne Hitch, MD;  Location: [facility];  Service: Orthopedics;  Laterality: Right;   TOTAL HIP ARTHROPLASTY Left 06/01/2015   Procedure: LEFT TOTAL HIP ARTHROPLASTY ANTERIOR APPROACH;  Surgeon: Kathryne Hitch, MD;  Location: [facility];  Service: Orthopedics;  Laterality: Left;   Social History:   reports that she has never smoked. She has never used smokeless tobacco. She reports that she does not drink alcohol and does not use drugs.  History reviewed. No pertinent family history.  Medications: Patient's Medications  New Prescriptions   No medications on file  Previous Medications   ASPIRIN EC 81 MG TABLET    Take 81 mg by mouth daily.   DICLOFENAC SODIUM (VOLTAREN) 1 % GEL    Apply 2 g topically 4 (four) times daily as needed.   GLIPIZIDE (GLUCOTROL) 5 MG TABLET    TAKE ONE TABLET BY MOUTH  TWICE DAILY BEFORE MEALS  (BREAKFAST AND LUNCH)   METFORMIN (GLUCOPHAGE) 500 MG TABLET    TAKE 2 TABLETS BY MOUTH  TWICE DAILY WITH MEALS   OMEPRAZOLE (PRILOSEC OTC) 20 MG TABLET    Take 1 tablet (20 mg total) by mouth daily before supper.   PROPYLENE GLYCOL (SYSTANE BALANCE OP)    Apply 1 drop to eye daily as needed (dry eyes).   SIMVASTATIN (ZOCOR) 40 MG TABLET    TAKE 1 TABLET BY MOUTH  DAILY  Modified Medications   Modified Medication Previous Medication   ACCU-CHEK FASTCLIX LANCETS MISC ACCU-CHEK FASTCLIX LANCETS MISC      Test blood sugar twice daily DX E08.319    Test blood sugar twice daily DX E08.319   GLUCOSE  BLOOD (ACCU-CHEK SMARTVIEW) TEST STRIP glucose blood (ACCU-CHEK SMARTVIEW) test strip      USE TO CHECK BLOOD SUGAR  TWO TIMES DAILY Dx:E11.3593    USE TO CHECK BLOOD SUGAR  TWO TIMES DAILY Dx:E11.3593  Discontinued Medications   No medications on file    Physical Exam:  Vitals:   11/11/21 0922 11/11/21 1051  BP: 140/80 90/60  Pulse: (!) 104   Temp: 97.8 F (36.6 C)   TempSrc: Temporal   SpO2: 98%   Weight: 162 lb (73.5 kg)   Height: 5\' 4"  (1.626 m)    Body mass index is 27.81 kg/m. Wt Readings from Last 3 Encounters:  11/11/21 162 lb (73.5 kg)  06/03/21 171 lb 6.4 oz (77.7 kg)  05/24/21 200 lb (90.7 kg)    GAD 7 : Generalized Anxiety Score 11/11/2021  Nervous, Anxious, on Edge 0  Control/stop worrying 3  Worry too much - different things 2  Trouble relaxing 0  Restless 0  Easily annoyed or irritable 3  Afraid - awful might happen 0  Total GAD 7 Score 8  Anxiety Difficulty Not difficult at all      Physical Exam Constitutional:      General: She is not in acute distress.    Appearance: Normal appearance. She is normal weight. She is not ill-appearing, toxic-appearing or diaphoretic.  HENT:     Right Ear: Tympanic membrane, ear canal and external ear normal.     Left Ear: Tympanic membrane, ear canal and external ear normal.     Mouth/Throat:     Mouth: Mucous membranes are dry.     Pharynx: Oropharynx is clear.  Eyes:     Conjunctiva/sclera: Conjunctivae normal.  Cardiovascular:     Rate and Rhythm: Tachycardia present.     Heart sounds: Normal heart sounds.  Pulmonary:     Effort: Pulmonary effort is normal.     Breath sounds: Normal breath sounds.  Abdominal:     General: Bowel sounds are normal.     Tenderness: There is no abdominal tenderness.  Musculoskeletal:        General: Swelling present.     Comments: Right knee from osteoarthritis  Skin:    General: Skin is warm and dry.  Neurological:     Mental Status: She is alert and oriented to  person, place, and time.  Psychiatric:        Mood and Affect: Mood normal.        Behavior: Behavior normal.        Thought Content: Thought content normal.        Judgment: Judgment normal.    Labs reviewed: Basic Metabolic Panel: Recent Labs    06/03/21 1013  NA 137  K 4.9  CL 101  CO2 24  GLUCOSE 150*  BUN 27*  CREATININE 1.69*  CALCIUM 9.3   Liver Function Tests: Recent Labs    06/03/21 1013  AST 13  ALT 8  BILITOT 0.3  PROT 6.6   No results for input(s): LIPASE, AMYLASE in the last 8760 hours. No results for input(s): AMMONIA in the last 8760 hours. CBC: Recent Labs    06/03/21 1013 06/21/21 0848  WBC 11.5* 11.2*  NEUTROABS 7,475 6,563  HGB 10.8* 10.7*  HCT 32.7* 32.4*  MCV 92.4 92.8  PLT 312 295   Lipid Panel: Recent Labs    06/03/21 1013  CHOL 116  HDL 47*  LDLCALC 45  TRIG 865*  CHOLHDL 2.5   TSH: No results for input(s): TSH in the last 8760 hours. A1C: Lab Results  Component Value Date   HGBA1C 7.7 (H) 06/03/2021     Assessment/Plan  1. Essential hypertension -Labs ordered - CMP with eGFR([facility]) - CBC with Differential/Platelet - On no medication, with orthostatic hypotension, cardiology has been consulted with recent monitor. Will request records.  -will get cardiology consult as well.   2. Controlled type 2 diabetes mellitus with stable proliferative retinopathy of both eyes, without long-term current use of insulin (HCC) - Patient was informed not to increase medication without consulting provider first. Patient should not take more than 2000 mg of metformin as prescribed.  -Increasing metformin can increase side effects such as lactic acidosis and B12 deficiency.  - Last  A1C was 7.7 on 06/03/21 -A1C ordered  -Continue dietary modification, reduce carbohydrates,  -Decrease similar sugars such as ice cream. -Recommend drinking one low sugar Ensure a day. -Not on any medication -- Hemoglobin A1c ordered - Accu-Chek FastClix  Lancets MISC; Test blood sugar twice daily DX E08.319  Dispense: 204 each; Refill: 11 - glucose blood (ACCU-CHEK SMARTVIEW) test strip; USE TO CHECK BLOOD SUGAR  TWO TIMES DAILY Dx:E11.3593  Dispense: 200 strip; Refill: 3 -Stable condition   3. Generalized anxiety disorder - Practice deep breathing exercise.  -will follow up - GAD 7 score is 8  4. Hypertriglyceridemia -Ordered Lipid panel and CMP with eGFR([facility]) -Controlled with Simvastatin 40 mg QD -Last triglyceride level was 163 on 06/03/21  5. Gastroesophageal reflux disease without esophagitis -Controlled with Omeprazole 20 mg -Takes Tums -No issues, stable.  6. Weight loss -has made healthy choices and decrease sugar and carbohydrates leading to weight loss -encouraged good protein intake with proper food choices to promote overall good health at this time -will follow up cmp, cbc, tsh at this time.     Next appt: Return in about 3 months (around 02/11/2022) for routine follow up. Janene Harvey. Biagio Borg -I personally was present during the history, physical exam and medical decision-making activities of this service and have verified that the service and findings are accurately documented in the students note [facility] [phone]

## 2021-11-12 LAB — LIPID PANEL
Cholesterol: 126 mg/dL (ref <200)
HDL: 50 mg/dL (ref >=50)
LDL Cholesterol (Calc): 53 mg/dL (calc)
Non-HDL Cholesterol (Calc): 76 mg/dL (calc) (ref <130)
Total CHOL/HDL Ratio: 2.5 (calc) (ref <5.0)
Triglycerides: 157 mg/dL — ABNORMAL HIGH (ref <150)

## 2021-11-12 LAB — HEMOGLOBIN A1C
Hgb A1c MFr Bld: 10.1 % of total Hgb — ABNORMAL HIGH (ref <5.7)
Mean Plasma Glucose: 243 mg/dL
eAG (mmol/L): 13.5 mmol/L

## 2021-11-12 LAB — CBC WITH DIFFERENTIAL/PLATELET
Absolute Monocytes: 1333 cells/uL — ABNORMAL HIGH (ref 200–950)
Basophils Absolute: 57 cells/uL (ref 0–200)
Basophils Relative: 0.5 %
Eosinophils Absolute: 260 cells/uL (ref 15–500)
Eosinophils Relative: 2.3 %
HCT: 31.7 % — ABNORMAL LOW (ref 35.0–45.0)
Hemoglobin: 10.4 g/dL — ABNORMAL LOW (ref 11.7–15.5)
Lymphs Abs: 2882 cells/uL (ref 850–3900)
MCH: 30.4 pg (ref 27.0–33.0)
MCHC: 32.8 g/dL (ref 32.0–36.0)
MCV: 92.7 fL (ref 80.0–100.0)
MPV: 10.7 fL (ref 7.5–12.5)
Monocytes Relative: 11.8 %
Neutro Abs: 6769 cells/uL (ref 1500–7800)
Neutrophils Relative %: 59.9 %
Platelets: 321 10*3/uL (ref 140–400)
RBC: 3.42 10*6/uL — ABNORMAL LOW (ref 3.80–5.10)
RDW: 12.7 % (ref 11.0–15.0)
Total Lymphocyte: 25.5 %
WBC: 11.3 10*3/uL — ABNORMAL HIGH (ref 3.8–10.8)

## 2021-11-12 LAB — COMPLETE METABOLIC PANEL WITH GFR
AG Ratio: 1.4 (calc) (ref 1.0–2.5)
ALT: 9 U/L (ref 6–29)
AST: 10 U/L (ref 10–35)
Albumin: 3.9 g/dL (ref 3.6–5.1)
Alkaline phosphatase (APISO): 92 U/L (ref 37–153)
BUN/Creatinine Ratio: 14 (calc) (ref 6–22)
BUN: 34 mg/dL — ABNORMAL HIGH (ref 7–25)
CO2: 19 mmol/L — ABNORMAL LOW (ref 20–32)
Calcium: 9.3 mg/dL (ref 8.6–10.4)
Chloride: 101 mmol/L (ref 98–110)
Creat: 2.4 mg/dL — ABNORMAL HIGH (ref 0.60–0.95)
Globulin: 2.8 g/dL (calc) (ref 1.9–3.7)
Glucose, Bld: 258 mg/dL — ABNORMAL HIGH (ref 65–139)
Potassium: 5.6 mmol/L — ABNORMAL HIGH (ref 3.5–5.3)
Sodium: 135 mmol/L (ref 135–146)
Total Bilirubin: 0.3 mg/dL (ref 0.2–1.2)
Total Protein: 6.7 g/dL (ref 6.1–8.1)
eGFR: 20 mL/min/{1.73_m2} — ABNORMAL LOW (ref >=60)

## 2021-11-12 LAB — TSH: TSH: 1.08 mIU/L (ref 0.40–4.50)

## 2021-11-14 ENCOUNTER — Telehealth: Payer: Self-pay

## 2021-11-14 NOTE — Telephone Encounter (Addendum)
Patient called stating she needs prescription for compression stockings for compression above 8-15. Need to specify compression on prescription. Needs prescription faxed [employer] in [place].Telephone number [phone] ? ?Message routed to Sherrie Mustache, NP ?

## 2021-11-14 NOTE — Telephone Encounter (Signed)
Tried calling patient on both home and cell phone, but no answer. ?

## 2021-11-14 NOTE — Telephone Encounter (Signed)
Would have her get the highest compression she can tolerate without a prescription.  ?

## 2021-11-15 NOTE — Telephone Encounter (Signed)
Spoke with patient and she will have measurement done for compression stockings. If the compression is a level that needs to be a prescription patient will call office back so prescription can be sent. ?

## 2021-11-17 ENCOUNTER — Telehealth: Payer: Medicare Other | Admitting: Nurse Practitioner

## 2021-11-17 ENCOUNTER — Telehealth (INDEPENDENT_AMBULATORY_CARE_PROVIDER_SITE_OTHER): Payer: Medicare Other | Admitting: Nurse Practitioner

## 2021-11-17 ENCOUNTER — Other Ambulatory Visit: Payer: Self-pay

## 2021-11-17 DIAGNOSIS — E1165 Type 2 diabetes mellitus with hyperglycemia: Secondary | ICD-10-CM | POA: Diagnosis not present

## 2021-11-17 DIAGNOSIS — E0822 Diabetes mellitus due to underlying condition with diabetic chronic kidney disease: Secondary | ICD-10-CM | POA: Diagnosis not present

## 2021-11-17 DIAGNOSIS — E875 Hyperkalemia: Secondary | ICD-10-CM

## 2021-11-17 DIAGNOSIS — Z794 Long term (current) use of insulin: Secondary | ICD-10-CM | POA: Diagnosis not present

## 2021-11-17 DIAGNOSIS — N184 Chronic kidney disease, stage 4 (severe): Secondary | ICD-10-CM | POA: Diagnosis not present

## 2021-11-17 MED ORDER — LINAGLIPTIN 5 MG PO TABS: 5.0000 mg | ORAL_TABLET | Freq: Every day | ORAL | 1 refills | Status: DC

## 2021-11-17 NOTE — Progress Notes (Signed)
This service is provided via telemedicine ? ?No vital signs collected/recorded due to the encounter was a telemedicine visit.  ? ?Location of patient (ex: home, work):  Home ? ?Patient consents to a telephone visit:  Yes, see encounter dated 09/08/2021 ? ?Location of the provider (ex: office, home):  [place] ? ?Name of any referring provider:  N/A ? ?Names of all persons participating in the telemedicine service and their role in the encounter:  Sherrie Mustache, Nurse Practitioner, Carroll Kinds, CMA, and patient.  ? ?Time spent on call:  8 minutes with medical assistant ? ?

## 2021-11-17 NOTE — Progress Notes (Signed)
? ? ?Careteam: ?Patient Care Team: ?Coral Spikes, DO as PCP - General (Family Medicine) ?Rankin, Clent Demark, MD as Consulting Physician (Ophthalmology) ? ?Advanced Directive information ?  ? ?Allergies  ?Allergen Reactions  ? Elemental Sulfur Hives  ? Naproxen Swelling and Other (See Comments)  ?  Blacked out - Face swelling ?  ? Tramadol Nausea And Vomiting  ? ? ?Chief Complaint  ?Patient presents with  ? Acute Visit  ?  Discuss labs and medications  ? ? ? ?HPI: Patient is a 86 y.o. female to discuss labs. She reports she is unable to do technology.  ? ?Kidney function has worsen on the recent labs.  ?She reports she does not drink a lot of water during the day.  ?A few cups of coffee in the morning.  ? ?She is on metformin. A1c up to 10.1, kidney function has worsened.  ?She had been trying to do good and then was told she needed to gain weight and then started to eat and drink poorly. She does not want to go on insulin.  ? ?Potassium level elevated - she has been eating foods high in potassium ? ?She has a hard time driving to our office- she has made an appt closer to home.  ? ?She had appt with cardiologist but has put that appt on hold until she meets with her new doctor.  ? ? ?Review of Systems:  ?Review of Systems  ?Constitutional:  Negative for chills and fever.  ?Respiratory:  Negative for cough and sputum production.   ?Cardiovascular:  Negative for chest pain and palpitations.  ?Gastrointestinal:  Negative for abdominal pain, constipation, diarrhea, heartburn, nausea and vomiting.  ?Genitourinary:  Negative for dysuria, frequency and urgency.  ?Neurological:  Positive for dizziness. Negative for headaches.  ? ?Past Medical History:  ?Diagnosis Date  ? Anxiety   ? regarding surgery; occassionally pt. states  ? Anxiety state, unspecified   ? Arthritis   ? Asymptomatic varicose veins   ? Broken arm   ? Chronic kidney disease   ? Diabetic retinopathy ([place])   ? Edema   ? Hard of hearing   ? Hyperpotassemia   ?  Hypertension   ? Inflammation of joint of knee   ? right knee  ? Insomnia, unspecified   ? Multiple thyroid nodules   ? Osteoarthrosis, unspecified whether generalized or localized, unspecified site   ? Other abnormal blood chemistry   ? Other and unspecified hyperlipidemia   ? Other malaise and fatigue   ? Other specified disease of nail   ? Overweight(278.02)   ? Pain in joint, lower leg   ? Pain in joint, pelvic region and thigh   ? Peripheral vascular disease ([place])   ? varicose veins-  "Blood Clot anterior left LEG WITH PREGNANCY"  ? PONV (postoperative nausea and vomiting)   ? pt. states that she was sick after hip surgery and oral surgery  ? Type II or unspecified type diabetes mellitus with renal manifestations, not stated as uncontrolled(250.40)   ? Undiagnosed cardiac murmurs   ? Unspecified disorder of kidney and ureter   ? Unspecified disorder of kidney and ureter   ? Urinary tract infection, site not specified   ? ?Past Surgical History:  ?Procedure Laterality Date  ? ABDOMINAL HYSTERECTOMY    ? EYE SURGERY Bilateral   ? cataract extraction with IOL  ? LIPOMA EXCISION    ? x2  bilateral buttocks  ? spinal meningitis  1957  ?  TONSILLECTOMY    ? TOTAL HIP ARTHROPLASTY Right 11/29/2012  ? Procedure: RIGHT TOTAL HIP ARTHROPLASTY ANTERIOR APPROACH;  Surgeon: Mcarthur Rossetti, MD;  Location: [facility];  Service: Orthopedics;  Laterality: Right;  ? TOTAL HIP ARTHROPLASTY Left 06/01/2015  ? Procedure: LEFT TOTAL HIP ARTHROPLASTY ANTERIOR APPROACH;  Surgeon: Mcarthur Rossetti, MD;  Location: [place];  Service: Orthopedics;  Laterality: Left;  ? ?Social History: ?  reports that she has never smoked. She has never used smokeless tobacco. She reports that she does not drink alcohol and does not use drugs. ? ?No family history on file. ? ?Medications: ?Patient's Medications  ?New Prescriptions  ? No medications on file  ?Previous Medications  ? ACCU-CHEK FASTCLIX LANCETS MISC    Test blood sugar twice daily DX  E08.319  ? ASPIRIN EC 81 MG TABLET    Take 81 mg by mouth daily.  ? DICLOFENAC SODIUM (VOLTAREN) 1 % GEL    Apply 2 g topically 4 (four) times daily as needed.  ? GLIPIZIDE (GLUCOTROL) 5 MG TABLET    TAKE ONE TABLET BY MOUTH  TWICE DAILY BEFORE MEALS  (BREAKFAST AND LUNCH)  ? GLUCOSE BLOOD (ACCU-CHEK SMARTVIEW) TEST STRIP    USE TO CHECK BLOOD SUGAR  TWO TIMES DAILY Dx:E11.3593  ? METFORMIN (GLUCOPHAGE) 500 MG TABLET    TAKE 2 TABLETS BY MOUTH  TWICE DAILY WITH MEALS  ? OMEPRAZOLE (PRILOSEC OTC) 20 MG TABLET    Take 1 tablet (20 mg total) by mouth daily before supper.  ? PROPYLENE GLYCOL (SYSTANE BALANCE OP)    Apply 1 drop to eye daily as needed (dry eyes).  ? SIMVASTATIN (ZOCOR) 40 MG TABLET    TAKE 1 TABLET BY MOUTH  DAILY  ?Modified Medications  ? No medications on file  ?Discontinued Medications  ? No medications on file  ? ? ?Physical Exam: ? ?There were no vitals filed for this visit. ?There is no height or weight on file to calculate BMI. ?Wt Readings from Last 3 Encounters:  ?11/11/21 162 lb (73.5 kg)  ?06/03/21 171 lb 6.4 oz (77.7 kg)  ?05/24/21 200 lb (90.7 kg)  ? ? ?Physical Exam ? ?Labs reviewed: ?Basic Metabolic Panel: ?Recent Labs  ?  06/03/21 ?1013 11/11/21 ?1056  ?NA 137 135  ?K 4.9 5.6*  ?CL 101 101  ?CO2 24 19*  ?GLUCOSE 150* 258*  ?BUN 27* 34*  ?CREATININE 1.69* 2.40*  ?CALCIUM 9.3 9.3  ?TSH  --  1.08  ? ?Liver Function Tests: ?Recent Labs  ?  06/03/21 ?1013 11/11/21 ?1056  ?AST 13 10  ?ALT 8 9  ?BILITOT 0.3 0.3  ?PROT 6.6 6.7  ? ?No results for input(s): LIPASE, AMYLASE in the last 8760 hours. ?No results for input(s): AMMONIA in the last 8760 hours. ?CBC: ?Recent Labs  ?  06/03/21 ?1013 06/21/21 ?4010 11/11/21 ?1056  ?WBC 11.5* 11.2* 11.3*  ?[place] 2,725 3,664  ?HGB 10.8* 10.7* 10.4*  ?HCT 32.7* 32.4* 31.7*  ?MCV 92.4 92.8 92.7  ?PLT 312 295 321  ? ?Lipid Panel: ?Recent Labs  ?  06/03/21 ?1013 11/11/21 ?1056  ?CHOL 116 126  ?HDL 47* 50  ?LDLCALC 45 53  ?TRIG 163* 157*  ?CHOLHDL 2.5 2.5   ? ?TSH: ?Recent Labs  ?  11/11/21 ?1056  ?TSH 1.08  ? ?A1C: ?Lab Results  ?Component Value Date  ? HGBA1C 10.1 (H) 11/11/2021  ? ? ? ?Assessment/Plan ?1. Uncontrolled type 2 diabetes mellitus with hyperglycemia ([place]) ?STOP metformin due to  progressive renal disease. GFR now 20.  ?She is very much against starting insulin at this time discussed with her we are limited in what we can prescribed.  ?-she is currently taking glipizide twice daily but not taking with food (encouraged to make sure she is taking with meal to avoid hypoglycemia) ?-encouraged to continue dietary modifications- states she has been making big changes ?-increase water intake ?-will start tradjenta and she will closely monitor her blood sugars. Discussed with her I feel like blood sugar will not be controlled without starting long acting insulin but she does not wish to start.  ?-she will follow up lab in office in 5 days and she is establishing with new PCP in 2 weeks and will bring blood sugar readings to office at visit. She can also call our office if glucose becomes significantly elevated before.  ?- linagliptin (TRADJENTA) 5 MG TABS tablet; Take 1 tablet (5 mg total) by mouth daily.  Dispense: 30 tablet; Refill: 1 ? ?2. Stage 4 chronic kidney disease ([place]) ?-worsening renal disease  ?Encourage proper hydration ?Follow metabolic panel ?Avoid nephrotoxic meds (NSAIDS) ?- Ambulatory referral to Nephrology ? ?3. Diabetes mellitus due to underlying condition with stage 4 chronic kidney disease, with long-term current use of insulin ([place]) ?-- discussed with the patient the pathophysiology of diabetes and the natural progression of the disease.  ?-stressed the importance of lifestyle changes including diet and exercise. ?-discussed complications associated with diabetes including retinopathy, nephropathy, neuropathy as well as increased risk of cardiovascular disease. We went over the benefit seen with glycemic control.   ? ?4. Hyperkalemia ?-to  avoid foods and drinks high in potassium  ?- BASIC METABOLIC PANEL WITH GFR; Future ? ?Carlos American. Dewaine Oats, AGNP ? ?[facility] ?[phone]  ? ? ?Virtual Visit via telephone ?

## 2021-11-22 ENCOUNTER — Other Ambulatory Visit: Payer: Self-pay

## 2021-11-22 ENCOUNTER — Other Ambulatory Visit: Payer: Medicare Other

## 2021-11-22 ENCOUNTER — Ambulatory Visit: Payer: Medicare Other | Admitting: Cardiology

## 2021-11-22 DIAGNOSIS — E0822 Diabetes mellitus due to underlying condition with diabetic chronic kidney disease: Secondary | ICD-10-CM | POA: Diagnosis not present

## 2021-11-22 DIAGNOSIS — E1165 Type 2 diabetes mellitus with hyperglycemia: Secondary | ICD-10-CM

## 2021-11-22 DIAGNOSIS — N184 Chronic kidney disease, stage 4 (severe): Secondary | ICD-10-CM | POA: Diagnosis not present

## 2021-11-22 DIAGNOSIS — E875 Hyperkalemia: Secondary | ICD-10-CM | POA: Diagnosis not present

## 2021-11-22 DIAGNOSIS — Z794 Long term (current) use of insulin: Secondary | ICD-10-CM | POA: Diagnosis not present

## 2021-11-23 ENCOUNTER — Ambulatory Visit (INDEPENDENT_AMBULATORY_CARE_PROVIDER_SITE_OTHER): Payer: Medicare Other | Admitting: Family

## 2021-11-23 ENCOUNTER — Telehealth: Payer: Self-pay

## 2021-11-23 VITALS — BP 152/82 | HR 99 | Temp 97.8°F | Ht 64.0 in

## 2021-11-23 DIAGNOSIS — E1165 Type 2 diabetes mellitus with hyperglycemia: Secondary | ICD-10-CM | POA: Diagnosis not present

## 2021-11-23 DIAGNOSIS — N184 Chronic kidney disease, stage 4 (severe): Secondary | ICD-10-CM

## 2021-11-23 LAB — BASIC METABOLIC PANEL WITH GFR
BUN/Creatinine Ratio: 18 (calc) (ref 6–22)
BUN: 45 mg/dL — ABNORMAL HIGH (ref 7–25)
CO2: 19 mmol/L — ABNORMAL LOW (ref 20–32)
Calcium: 9.4 mg/dL (ref 8.6–10.4)
Chloride: 101 mmol/L (ref 98–110)
Creat: 2.49 mg/dL — ABNORMAL HIGH (ref 0.60–0.95)
Glucose, Bld: 330 mg/dL — ABNORMAL HIGH (ref 65–99)
Potassium: 5.2 mmol/L (ref 3.5–5.3)
Sodium: 133 mmol/L — ABNORMAL LOW (ref 135–146)
eGFR: 19 mL/min/{1.73_m2} — ABNORMAL LOW (ref >=60)

## 2021-11-23 MED ORDER — LEVEMIR FLEXPEN 100 UNIT/ML ~~LOC~~ SOPN: 10.0000 [IU] | PEN_INJECTOR | Freq: Every day | SUBCUTANEOUS | 11 refills | Status: DC

## 2021-11-23 MED ORDER — INSULIN PEN NEEDLE 31G X 5 MM MISC: 1.0000 | Freq: Every day | 0 refills | Status: DC

## 2021-11-23 NOTE — Telephone Encounter (Signed)
Patient called very concerned. She has increased her water intake and been off Metformin since Friday. Sugar has stayed up since off Metformin. She has lost 40 pounds. She has been watching her diet very strictly. She is taking two glipedzides and one tragenta. Blood sugars remain high, it was 357 yesterday morning.  [phone] ?

## 2021-11-23 NOTE — Telephone Encounter (Signed)
Spoke with patient and discussed providers response. Patient is scheduled today with Dinah at 3:45 pm for a telephone visit to further discuss.  ?

## 2021-11-23 NOTE — Progress Notes (Addendum)
? ?Provider: Marlowe Sax FNP-C ? ?Coral Spikes, DO ? ?Patient Care Team: ?Coral Spikes, DO as PCP - General (Family Medicine) ?Rankin, Clent Demark, MD as Consulting Physician (Ophthalmology) ? ?Extended Emergency Contact Information ?Primary Emergency Contact: Blackburn,Debbie ?Address: [place] ?         [place],  [address] [place] ?Home Phone: [phone] ?Mobile Phone: [phone] ?Relation: Daughter ?Secondary Emergency Contact: Ishmael,[name] ? [place] ?Home Phone: [phone] ?Relation: Son ? ?Code Status:  DNR ?Goals of care: Advanced Directive information ? ?  11/11/2021  ?  9:30 AM  ?Advanced Directives  ?Does Patient Have a Medical Advance Directive? Yes  ?Type of Advance Directive Out of facility DNR (pink MOST or yellow form)  ?Does patient want to make changes to medical advance directive? No - Patient declined  ?Pre-existing out of facility DNR order (yellow form or pink MOST form) Yellow form placed in chart (order not valid for inpatient use)  ? ?This service is provided via telemedicine ? ?No vital signs collected/recorded due to the encounter was a telemedicine visit.  ? ?Location of patient (ex: home, work):  Home  ? ?Patient consents to a telephone visit:  yes ? ?Location of the provider (ex: office, home):  [facility] office  ? ?Name of any referring provider:  Dani Gobble  ? ?Names of all persons participating in the telemedicine service and their role in the encounter:  Patient and Provider  ? ? ?Chief Complaint  ?Patient presents with  ? Acute Visit  ?  Patient c/o elevated blood sugars (see telephone encounter dated 11/23/2021). Per Sherrie Mustache, NP patient to be started on insulin. Discuss recent labs also. Visit performed via telephone call. Patient states her sugar is 350 for 11/23/2021.  ? ? ?HPI:  ?Pt is a 86 y.o. female seen today for an acute visit for evaluation of high blood sugars.  She states her blood  sugar this morning was 357.  States blood sugars are running in the 300s throughout the day.  She called office today and was advised today by PCP Sherrie Mustache, NP to follow-up today to discuss starting on insulin.   ?Of note, she had a visit with PCP on 11/17/2021 had abnormal blood work hemoglobin A1c was 10.1 glucose 258, BUN 34 and creatinine was 2.40, and estimated GFR 19.  Kidney function have declined compared to previous..She was referred to nephrology for evaluation.  Her metformin was also discontinued. She was continued on glipizide for diabetes.  Tradjenta was initiated. ?She was also advised to increase fluid intake and modify her diet.  She states she has modified her diet strictly and has been drinking fluid.  She had a repeat BMP which showed glucose 330, BUN 45 and creatinine 2.49 results worse than the recent.  She denies any acute illnesses fever or chills. ? ? ?Past Medical History:  ?Diagnosis Date  ? Anxiety   ? regarding surgery; occassionally pt. states  ? Anxiety state, unspecified   ? Arthritis   ? Asymptomatic varicose veins   ? Broken arm   ? Chronic kidney disease   ? Diabetic retinopathy ([place])   ? Edema   ? Hard of hearing   ? Hyperpotassemia   ? Hypertension   ? Inflammation of joint of knee   ? right knee  ? Insomnia, unspecified   ? Multiple thyroid nodules   ? Osteoarthrosis, unspecified whether generalized or localized, unspecified site   ?  Other abnormal blood chemistry   ? Other and unspecified hyperlipidemia   ? Other malaise and fatigue   ? Other specified disease of nail   ? Overweight(278.02)   ? Pain in joint, lower leg   ? Pain in joint, pelvic region and thigh   ? Peripheral vascular disease ([place])   ? varicose veins-  "Blood Clot anterior left LEG WITH PREGNANCY"  ? PONV (postoperative nausea and vomiting)   ? pt. states that she was sick after hip surgery and oral surgery  ? Type II or unspecified type diabetes mellitus with renal manifestations, not stated as  uncontrolled(250.40)   ? Undiagnosed cardiac murmurs   ? Unspecified disorder of kidney and ureter   ? Unspecified disorder of kidney and ureter   ? Urinary tract infection, site not specified   ? ?Past Surgical History:  ?Procedure Laterality Date  ? ABDOMINAL HYSTERECTOMY    ? EYE SURGERY Bilateral   ? cataract extraction with IOL  ? LIPOMA EXCISION    ? x2  bilateral buttocks  ? spinal meningitis  1957  ? TONSILLECTOMY    ? TOTAL HIP ARTHROPLASTY Right 11/29/2012  ? Procedure: RIGHT TOTAL HIP ARTHROPLASTY ANTERIOR APPROACH;  Surgeon: Mcarthur Rossetti, MD;  Location: [facility];  Service: Orthopedics;  Laterality: Right;  ? TOTAL HIP ARTHROPLASTY Left 06/01/2015  ? Procedure: LEFT TOTAL HIP ARTHROPLASTY ANTERIOR APPROACH;  Surgeon: Mcarthur Rossetti, MD;  Location: [place];  Service: Orthopedics;  Laterality: Left;  ? ? ?Allergies  ?Allergen Reactions  ? Elemental Sulfur Hives  ? Naproxen Swelling and Other (See Comments)  ?  Blacked out - Face swelling ?  ? Tramadol Nausea And Vomiting  ? ? ?Outpatient Encounter Medications as of 11/23/2021  ?Medication Sig  ? Accu-Chek FastClix Lancets MISC Test blood sugar twice daily DX E08.319  ? aspirin EC 81 MG tablet Take 81 mg by mouth daily.  ? diclofenac Sodium (VOLTAREN) 1 % GEL Apply 2 g topically 4 (four) times daily as needed.  ? glipiZIDE (GLUCOTROL) 5 MG tablet TAKE ONE TABLET BY MOUTH  TWICE DAILY BEFORE MEALS  (BREAKFAST AND LUNCH)  ? glucose blood (ACCU-CHEK SMARTVIEW) test strip USE TO CHECK BLOOD SUGAR  TWO TIMES DAILY Dx:E11.3593  ? linagliptin (TRADJENTA) 5 MG TABS tablet Take 1 tablet (5 mg total) by mouth daily.  ? omeprazole (PRILOSEC OTC) 20 MG tablet Take 1 tablet (20 mg total) by mouth daily before supper.  ? Propylene Glycol (SYSTANE BALANCE OP) Apply 1 drop to eye daily as needed (dry eyes).  ? simvastatin (ZOCOR) 40 MG tablet TAKE 1 TABLET BY MOUTH  DAILY  ? ?No facility-administered encounter medications on file as of 11/23/2021.  ? ? ?Review of  Systems  ?Constitutional:  Negative for appetite change, chills, fatigue, fever and unexpected weight change.  ?HENT:  Negative for congestion, dental problem, ear discharge, ear pain, hearing loss, nosebleeds, postnasal drip, rhinorrhea, sinus pressure, sinus pain, sneezing and sore throat.   ?Eyes:  Negative for pain, discharge, redness, itching and visual disturbance.  ?Respiratory:  Negative for cough, chest tightness, shortness of breath and wheezing.   ?Cardiovascular:  Negative for chest pain, palpitations and leg swelling.  ?Gastrointestinal:  Negative for abdominal distention, abdominal pain, constipation, diarrhea, nausea and vomiting.  ?Endocrine: Negative for polydipsia, polyphagia and polyuria.  ?Genitourinary:  Negative for difficulty urinating, dysuria, flank pain, frequency and urgency.  ?Skin:  Negative for color change, pallor, rash and wound.  ?Neurological:  Negative for dizziness,  weakness, light-headedness, numbness and headaches.  ?Psychiatric/Behavioral:  Negative for agitation, behavioral problems, confusion, hallucinations, self-injury, sleep disturbance and suicidal ideas. The patient is not nervous/anxious.   ? ?Immunization History  ?Administered Date(s) Administered  ? Fluad Quad(high Dose 65+) 06/19/2019  ? Influenza Whole 05/28/2012  ? Influenza, High Dose Seasonal PF 06/13/2018  ? Influenza,inj,Quad PF,6+ Mos 07/17/2016  ? Influenza-Unspecified 07/02/2017  ? Moderna Sars-Covid-2 Vaccination 05/21/2020  ? Pneumococcal Conjugate-13 11/06/2014  ? Pneumococcal Polysaccharide-23 02/23/2017  ? ?Pertinent  Health Maintenance Due  ?Topic Date Due  ? OPHTHALMOLOGY EXAM  10/23/2018  ? INFLUENZA VACCINE  11/25/2021 (Originally 03/28/2021)  ? DEXA SCAN  08/29/2023 (Originally 04/12/2003)  ? HEMOGLOBIN A1C  05/14/2022  ? FOOT EXAM  11/12/2022  ? ? ?  03/26/2021  ?  4:33 AM 06/03/2021  ?  8:55 AM 06/22/2021  ?  9:10 AM 09/08/2021  ?  2:42 PM 11/11/2021  ?  9:28 AM  ?Fall Risk  ?Falls in the past year?  '1  1 1 1  '$ ?Was there an injury with Fall?  '1 1 1 1  '$ ?Was there an injury with Fall? - Comments     Broke bone in left arm  ?Fall Risk Category Calculator  '3 3 3 2  '$ ?Fall Risk Category  High High High Moderate  ?Patie

## 2021-11-23 NOTE — Telephone Encounter (Signed)
Yes blood sugars are very high, recommend insulin at this time. Can set up appt with dinah this afternoon to discussed or appt with provider tomorrow.  ?

## 2021-11-25 ENCOUNTER — Encounter: Payer: Self-pay | Admitting: Nurse Practitioner

## 2021-11-25 ENCOUNTER — Telehealth (INDEPENDENT_AMBULATORY_CARE_PROVIDER_SITE_OTHER): Payer: Medicare Other | Admitting: Nurse Practitioner

## 2021-11-25 VITALS — BP 134/72 | HR 89

## 2021-11-25 DIAGNOSIS — N184 Chronic kidney disease, stage 4 (severe): Secondary | ICD-10-CM | POA: Diagnosis not present

## 2021-11-25 DIAGNOSIS — E1165 Type 2 diabetes mellitus with hyperglycemia: Secondary | ICD-10-CM | POA: Diagnosis not present

## 2021-11-25 MED ORDER — LEVEMIR FLEXPEN 100 UNIT/ML ~~LOC~~ SOPN: 15.0000 [IU] | PEN_INJECTOR | Freq: Every day | SUBCUTANEOUS | 11 refills | Status: DC

## 2021-11-25 NOTE — Progress Notes (Signed)
? ? ? ?Careteam: ?Patient Care Team: ?Coral Spikes, DO as PCP - General (Family Medicine) ?Rankin, Clent Demark, MD as Consulting Physician (Ophthalmology) ? ?Advanced Directive information ?  ? ?Allergies  ?Allergen Reactions  ? Elemental Sulfur Hives  ? Naproxen Swelling and Other (See Comments)  ?  Blacked out - Face swelling ?  ? Tramadol Nausea And Vomiting  ? ? ?Chief Complaint  ?Patient presents with  ? Acute Visit  ?  Elevated blood sugar. Visit completed via Telephone visit (per patient she has no way to do a video visit). Blood sugar readings 343-6 am, 338- 8 am, and 348-9:15  ? ? ? ?HPI: Patient is a 86 y.o. female to discuss blood sugars.  ?She started levemir 10 units 2 nights ago.  ?She has been working hard to control her blood sugar with diet and medication.  ?She is also taking glipizide and tradjenta.  ?Reports tradjenta has not touched her blood sugar.  ?No low blood sugars ?Fasting blood sugar 340s the last 2 mornings when she was on levemir.  ? ?Pt reports she is feeling better now that she is drinking more water.  ?Daughter in law is helping her with food.  ? ?She wants to go to nephrologist in [place].  ? ?Review of Systems:  ?Review of Systems  ?Constitutional:  Negative for chills, fever and weight loss.  ?HENT:  Negative for tinnitus.   ?Eyes: Negative.   ?Respiratory:  Negative for cough, sputum production and shortness of breath.   ?Cardiovascular:  Negative for chest pain, palpitations and leg swelling.  ?Gastrointestinal:  Negative for abdominal pain, constipation, diarrhea and heartburn.  ?Genitourinary:  Negative for dysuria, frequency and urgency.  ?Musculoskeletal:  Negative for back pain, falls, joint pain and myalgias.  ?Skin: Negative.   ?Neurological:  Negative for dizziness, sensory change, speech change and headaches.  ?Psychiatric/Behavioral:  Negative for depression.   ?Past Medical History:  ?Diagnosis Date  ? Anxiety   ? regarding surgery; occassionally pt. states  ?  Anxiety state, unspecified   ? Arthritis   ? Asymptomatic varicose veins   ? Broken arm   ? Chronic kidney disease   ? Diabetic retinopathy ([place])   ? Edema   ? Hard of hearing   ? Hyperpotassemia   ? Hypertension   ? Inflammation of joint of knee   ? right knee  ? Insomnia, unspecified   ? Multiple thyroid nodules   ? Osteoarthrosis, unspecified whether generalized or localized, unspecified site   ? Other abnormal blood chemistry   ? Other and unspecified hyperlipidemia   ? Other malaise and fatigue   ? Other specified disease of nail   ? Overweight(278.02)   ? Pain in joint, lower leg   ? Pain in joint, pelvic region and thigh   ? Peripheral vascular disease ([place])   ? varicose veins-  "Blood Clot anterior left LEG WITH PREGNANCY"  ? PONV (postoperative nausea and vomiting)   ? pt. states that she was sick after hip surgery and oral surgery  ? Type II or unspecified type diabetes mellitus with renal manifestations, not stated as uncontrolled(250.40)   ? Undiagnosed cardiac murmurs   ? Unspecified disorder of kidney and ureter   ? Unspecified disorder of kidney and ureter   ? Urinary tract infection, site not specified   ? ?Past Surgical History:  ?Procedure Laterality Date  ? ABDOMINAL HYSTERECTOMY    ? EYE SURGERY Bilateral   ? cataract extraction with IOL  ?  LIPOMA EXCISION    ? x2  bilateral buttocks  ? spinal meningitis  1957  ? TONSILLECTOMY    ? TOTAL HIP ARTHROPLASTY Right 11/29/2012  ? Procedure: RIGHT TOTAL HIP ARTHROPLASTY ANTERIOR APPROACH;  Surgeon: Mcarthur Rossetti, MD;  Location: [facility];  Service: Orthopedics;  Laterality: Right;  ? TOTAL HIP ARTHROPLASTY Left 06/01/2015  ? Procedure: LEFT TOTAL HIP ARTHROPLASTY ANTERIOR APPROACH;  Surgeon: Mcarthur Rossetti, MD;  Location: [place];  Service: Orthopedics;  Laterality: Left;  ? ?Social History: ?  reports that she has never smoked. She has never used smokeless tobacco. She reports that she does not drink alcohol and does not use drugs. ? ?History  reviewed. No pertinent family history. ? ?Medications: ?Patient's Medications  ?New Prescriptions  ? No medications on file  ?Previous Medications  ? ACCU-CHEK FASTCLIX LANCETS MISC    Test blood sugar twice daily DX E08.319  ? ASPIRIN EC 81 MG TABLET    Take 81 mg by mouth daily.  ? DICLOFENAC SODIUM (VOLTAREN) 1 % GEL    Apply 2 g topically 4 (four) times daily as needed.  ? GLIPIZIDE (GLUCOTROL) 5 MG TABLET    TAKE ONE TABLET BY MOUTH  TWICE DAILY BEFORE MEALS  (BREAKFAST AND LUNCH)  ? GLUCOSE BLOOD (ACCU-CHEK SMARTVIEW) TEST STRIP    USE TO CHECK BLOOD SUGAR  TWO TIMES DAILY Dx:E11.3593  ? INSULIN DETEMIR (LEVEMIR FLEXPEN) 100 UNIT/ML FLEXPEN    Inject 10 Units into the skin at bedtime.  ? INSULIN PEN NEEDLE 31G X 5 MM MISC    1 Device by Does not apply route at bedtime. E11.65  ? LINAGLIPTIN (TRADJENTA) 5 MG TABS TABLET    Take 1 tablet (5 mg total) by mouth daily.  ? OMEPRAZOLE (PRILOSEC OTC) 20 MG TABLET    Take 1 tablet (20 mg total) by mouth daily before supper.  ? PROPYLENE GLYCOL (SYSTANE BALANCE OP)    Apply 1 drop to eye daily as needed (dry eyes).  ? SIMVASTATIN (ZOCOR) 40 MG TABLET    TAKE 1 TABLET BY MOUTH  DAILY  ?Modified Medications  ? No medications on file  ?Discontinued Medications  ? No medications on file  ? ? ?Physical Exam: ? ?Vitals:  ? 11/25/21 0930  ?BP: 134/72  ?Pulse: 89  ? ?There is no height or weight on file to calculate BMI. ?Wt Readings from Last 3 Encounters:  ?11/11/21 162 lb (73.5 kg)  ?06/03/21 171 lb 6.4 oz (77.7 kg)  ?05/24/21 200 lb (90.7 kg)  ? ? ? ?Labs reviewed: ?Basic Metabolic Panel: ?Recent Labs  ?  06/03/21 ?1013 11/11/21 ?1056 11/22/21 ?0811  ?NA 137 135 133*  ?K 4.9 5.6* 5.2  ?CL 101 101 101  ?CO2 24 19* 19*  ?GLUCOSE 150* 258* 330*  ?BUN 27* 34* 45*  ?CREATININE 1.69* 2.40* 2.49*  ?CALCIUM 9.3 9.3 9.4  ?TSH  --  1.08  --   ? ?Liver Function Tests: ?Recent Labs  ?  06/03/21 ?1013 11/11/21 ?1056  ?AST 13 10  ?ALT 8 9  ?BILITOT 0.3 0.3  ?PROT 6.6 6.7  ? ?No results  for input(s): LIPASE, AMYLASE in the last 8760 hours. ?No results for input(s): AMMONIA in the last 8760 hours. ?CBC: ?Recent Labs  ?  06/03/21 ?1013 06/21/21 ?9024 11/11/21 ?1056  ?WBC 11.5* 11.2* 11.3*  ?[place] 0,973 5,329  ?HGB 10.8* 10.7* 10.4*  ?HCT 32.7* 32.4* 31.7*  ?MCV 92.4 92.8 92.7  ?PLT 312 295 321  ? ?  Lipid Panel: ?Recent Labs  ?  06/03/21 ?1013 11/11/21 ?1056  ?CHOL 116 126  ?HDL 47* 50  ?LDLCALC 45 53  ?TRIG 163* 157*  ?CHOLHDL 2.5 2.5  ? ?TSH: ?Recent Labs  ?  11/11/21 ?1056  ?TSH 1.08  ? ?A1C: ?Lab Results  ?Component Value Date  ? HGBA1C 10.1 (H) 11/11/2021  ? ? ? ?Assessment/Plan ?1. Uncontrolled type 2 diabetes mellitus with hyperglycemia ([place]) ?-dietary modifications encouraged.  ?Continue diet and medication- will increase levemir to 15 units.  ?- insulin detemir (LEVEMIR FLEXPEN) 100 UNIT/ML FlexPen; Inject 15 Units into the skin at bedtime.  Dispense: 15 mL; Refill: 11 ? ?2. Stage 4 chronic kidney disease ([place]) ?Encourage proper hydration ?Avoid nephrotoxic meds (NSAIDS) ?-she has been referred to nephrologist.  ? ? ?Carlos American. Dewaine Oats, AGNP ? ?[facility] ?[phone]  ? ? ?Virtual Visit via telephone ? ?I connected with patient on 11/25/21 at  9:20 AM EDT by telephone and verified that I am speaking with the correct person using two identifiers. ? ?Location: ?Patient: home ?Provider: psc ?  ?I discussed the limitations, risks, security and privacy concerns of performing an evaluation and management service by telephone and the availability of in person appointments. I also discussed with the patient that there may be a patient responsible charge related to this service. The patient expressed understanding and agreed to proceed. ? ? ?I discussed the assessment and treatment plan with the patient. The patient was provided an opportunity to ask questions and all were answered. The patient agreed with the plan and demonstrated an understanding of the  instructions. ?  ?The patient was advised to call back or seek an in-person evaluation if the symptoms worsen or if the condition fails to improve as anticipated. ? ?I provided 13 minutes of non-face-to-face time du

## 2021-11-25 NOTE — Telephone Encounter (Signed)
Error

## 2021-11-25 NOTE — Patient Instructions (Signed)
Increase

## 2021-11-25 NOTE — Progress Notes (Signed)
? ?  This service is provided via telemedicine ? ?No vital signs collected/recorded due to the encounter was a telemedicine visit.  ? ?Location of patient (ex: home, work):  Home ? ?Patient consents to a telephone visit: Yes, see telephone visit dated 09/08/2021 ? ?Location of the provider (ex: office, home):  [facility], Office  ? ?Name of any referring provider:  N/A ? ?Names of all persons participating in the telemedicine service and their role in the encounter:  S.Chrae B/CMA, Sherrie Mustache, NP, and Patient  ? ?Time spent on call:  5 min with medical assistant  ? ?

## 2021-11-29 ENCOUNTER — Telehealth: Payer: Medicare Other | Admitting: Nurse Practitioner

## 2021-12-01 ENCOUNTER — Ambulatory Visit: Payer: Medicare Other | Admitting: Family Medicine

## 2021-12-01 DIAGNOSIS — E1165 Type 2 diabetes mellitus with hyperglycemia: Secondary | ICD-10-CM

## 2021-12-01 DIAGNOSIS — I10 Essential (primary) hypertension: Secondary | ICD-10-CM | POA: Diagnosis not present

## 2021-12-01 DIAGNOSIS — N184 Chronic kidney disease, stage 4 (severe): Secondary | ICD-10-CM

## 2021-12-01 DIAGNOSIS — E1122 Type 2 diabetes mellitus with diabetic chronic kidney disease: Secondary | ICD-10-CM | POA: Diagnosis not present

## 2021-12-01 DIAGNOSIS — E113553 Type 2 diabetes mellitus with stable proliferative diabetic retinopathy, bilateral: Secondary | ICD-10-CM | POA: Insufficient documentation

## 2021-12-01 DIAGNOSIS — Z794 Long term (current) use of insulin: Secondary | ICD-10-CM | POA: Diagnosis not present

## 2021-12-01 DIAGNOSIS — E1121 Type 2 diabetes mellitus with diabetic nephropathy: Secondary | ICD-10-CM | POA: Insufficient documentation

## 2021-12-01 NOTE — Patient Instructions (Signed)
No changes at this time. ? ?Continue the insulin at current dosing. Watch carb intake. ? ?Appt with Nephrology is next week. ? ?I have placed an urgent referral to Endo. ? ?Follow up 1 week. ?

## 2021-12-02 ENCOUNTER — Ambulatory Visit: Payer: Medicare Other | Admitting: "Endocrinology

## 2021-12-02 ENCOUNTER — Encounter: Payer: Self-pay | Admitting: "Endocrinology

## 2021-12-02 VITALS — BP 126/86 | HR 72 | Ht 64.0 in | Wt 161.8 lb

## 2021-12-02 DIAGNOSIS — N184 Chronic kidney disease, stage 4 (severe): Secondary | ICD-10-CM | POA: Diagnosis not present

## 2021-12-02 DIAGNOSIS — E1122 Type 2 diabetes mellitus with diabetic chronic kidney disease: Secondary | ICD-10-CM

## 2021-12-02 DIAGNOSIS — E1165 Type 2 diabetes mellitus with hyperglycemia: Secondary | ICD-10-CM

## 2021-12-02 DIAGNOSIS — E663 Overweight: Secondary | ICD-10-CM | POA: Insufficient documentation

## 2021-12-02 DIAGNOSIS — Z794 Long term (current) use of insulin: Secondary | ICD-10-CM

## 2021-12-02 DIAGNOSIS — E782 Mixed hyperlipidemia: Secondary | ICD-10-CM

## 2021-12-02 MED ORDER — ACCU-CHEK SMARTVIEW VI STRP: ORAL_STRIP | 2 refills | Status: DC

## 2021-12-02 MED ORDER — LEVEMIR FLEXPEN 100 UNIT/ML ~~LOC~~ SOPN: 24.0000 [IU] | PEN_INJECTOR | Freq: Every day | SUBCUTANEOUS | 2 refills | Status: DC

## 2021-12-02 NOTE — Assessment & Plan Note (Signed)
Defer to nephrology given advanced renal disease and frequent orthostasis. ?

## 2021-12-02 NOTE — Progress Notes (Signed)
? ?Subjective:  ?Patient ID: Julie Baxter, female    DOB: Jul 22, 1936  Age: 86 y.o. MRN: [id number] ? ?CC: ?Chief Complaint  ?Patient presents with  ? New Patient (Initial Visit)  ?  Establish care  ? Medication Refill  ?  Glucose blood strips  ? ? ?HPI: ? ?86 year old female with uncontrolled type 2 diabetes with complications including retinopathy and stage IV CKD, hyperlipidemia, hypertension with frequent orthostasis presents to establish care with me.  ? ?DM-2 ?Recently started insulin.  She is on 15 units of Levemir at night. ?Last A1c was 10.1 on 11/11/2021. ?She is also on Tradjenta and glipizide. ?She has her blood sugar logs today and they are mostly elevated.  Over the past 2 days she has had lower fasting sugars.  Her blood sugars in the afternoons are quite elevated. ?Needs extensive diabetic teaching and will likely need bolus insulin as well.  Needs to see endocrinology. ?Denies hypoglycemia. ? ?CKD ?Has an upcoming appointment with nephrology next week.  She has advanced kidney disease and is anemic as a result. ?She states that she is trying to stay hydrated. ? ?Hypertension ?Blood pressures are labile.  She often experiences orthostasis. ?She is on no pharmacotherapy at this time.  ? ?Patient Active Problem List  ? Diagnosis Date Noted  ? CKD (chronic kidney disease) stage 4, GFR 15-29 ml/min (HCC) 12/02/2021  ? Type 2 diabetes mellitus with stage 4 chronic kidney disease, with long-term current use of insulin ([place]) 12/01/2021  ? Orthostatic hypotension 12/12/2018  ? Essential hypertension 12/16/2015  ? Generalized anxiety disorder 11/25/2014  ? Diabetic retinopathy ([place])   ? Hyperlipidemia, mixed   ? ? ?Social Hx   ?Social History  ? ?Socioeconomic History  ? Marital status: Single  ?  Spouse name: Not on file  ? Number of children: Not on file  ? Years of education: Not on file  ? Highest education level: Not on file  ?Occupational History  ? Not on file  ?Tobacco Use  ? Smoking status: Never   ? Smokeless tobacco: Never  ?Vaping Use  ? Vaping Use: Never used  ?Substance and Sexual Activity  ? Alcohol use: No  ? Drug use: No  ? Sexual activity: Not on file  ?Other Topics Concern  ? Not on file  ?Social History Narrative  ? Not on file  ? ?Social Determinants of Health  ? ?Financial Resource Strain: Not on file  ?Food Insecurity: Not on file  ?Transportation Needs: Not on file  ?Physical Activity: Not on file  ?Stress: Not on file  ?Social Connections: Not on file  ? ? ?Review of Systems  ?Constitutional:  Negative for fever.  ?Respiratory: Negative.    ?Cardiovascular: Negative.   ?Per HPI ? ?Objective:  ?BP (!) 146/84   Pulse 95   Temp 97.9 ?F (36.6 ?C) (Oral)   Ht '5\' 4"'$  (1.626 m)   Wt 161 lb 3.2 oz (73.1 kg)   SpO2 100%   BMI 27.67 kg/m?  ? ? ?  12/02/2021  ?  8:19 AM 12/01/2021  ?  8:51 AM 12/01/2021  ?  8:25 AM  ?BP/Weight  ?Systolic BP 606 301 601  ?Diastolic BP 86 84 80  ?Wt. (Lbs) 161.8  161.2  ?BMI 27.77 kg/m2  27.67 kg/m2  ? ? ?Physical Exam ?Constitutional:   ?   General: She is not in acute distress. ?   Appearance: Normal appearance.  ?HENT:  ?   Head: Normocephalic and atraumatic.  ?  Eyes:  ?   General:     ?   Right eye: No discharge.     ?   Left eye: No discharge.  ?   Conjunctiva/sclera: Conjunctivae normal.  ?Cardiovascular:  ?   Rate and Rhythm: Normal rate and regular rhythm.  ?Pulmonary:  ?   Effort: Pulmonary effort is normal.  ?   Breath sounds: Normal breath sounds. No wheezing, rhonchi or rales.  ?Neurological:  ?   Mental Status: She is alert.  ?Psychiatric:     ?   Mood and Affect: Mood normal.     ?   Behavior: Behavior normal.  ? ? ?Lab Results  ?Component Value Date  ? WBC 11.3 (H) 11/11/2021  ? HGB 10.4 (L) 11/11/2021  ? HCT 31.7 (L) 11/11/2021  ? PLT 321 11/11/2021  ? GLUCOSE 330 (H) 11/22/2021  ? CHOL 126 11/11/2021  ? TRIG 157 (H) 11/11/2021  ? HDL 50 11/11/2021  ? LDLCALC 53 11/11/2021  ? ALT 9 11/11/2021  ? AST 10 11/11/2021  ? NA 133 (L) 11/22/2021  ? K 5.2 11/22/2021   ? CL 101 11/22/2021  ? CREATININE 2.49 (H) 11/22/2021  ? BUN 45 (H) 11/22/2021  ? CO2 19 (L) 11/22/2021  ? TSH 1.08 11/11/2021  ? INR 0.88 11/18/2012  ? HGBA1C 10.1 (H) 11/11/2021  ? ? ? ?Assessment & Plan:  ? ?Problem List Items Addressed This Visit   ? ?  ? Cardiovascular and Mediastinum  ? Essential hypertension  ?  Defer to nephrology given advanced renal disease and frequent orthostasis. ?  ?  ?  ? Endocrine  ? Type 2 diabetes mellitus with stage 4 chronic kidney disease, with long-term current use of insulin ([place])  ?  Urgent referral to endocrinology. ?I am not making any changes to her diabetic regimen at this time. Continue current dosing of Levemir at 15 units at night and Tradjenta and glipizide. ?Follow up in 1 week. ?  ?  ? RESOLVED: Uncontrolled type 2 diabetes mellitus with hyperglycemia ([place])  ? Relevant Orders  ? Ambulatory referral to Endocrinology  ?  ? Genitourinary  ? CKD (chronic kidney disease) stage 4, GFR 15-29 ml/min (HCC)  ?  Has appt with Renal. Will follow closely. ?Avoid nephrotoxic medications. ?  ?  ? ? ?Follow-up:  Return in about 1 week (around 12/08/2021). ? ?Thersa Salt DO ?[place] ? ?

## 2021-12-02 NOTE — Patient Instructions (Signed)

## 2021-12-02 NOTE — Assessment & Plan Note (Addendum)
Urgent referral to endocrinology. ?I am not making any changes to her diabetic regimen at this time. Continue current dosing of Levemir at 15 units at night and Tradjenta and glipizide. ?Follow up in 1 week. ?

## 2021-12-02 NOTE — Progress Notes (Signed)
? ?                                                             Endocrinology Consult Note  ?     12/02/2021, 12:48 PM ? ? ?Subjective:  ? ? Patient ID: Julie Baxter, female    DOB: 1936-06-12.  ?Julie Baxter is being seen in consultation for management of currently uncontrolled symptomatic diabetes requested by  Coral Spikes, DO. ? ? ?Past Medical History:  ?Diagnosis Date  ? Anxiety   ? regarding surgery; occassionally pt. states  ? Anxiety state, unspecified   ? Arthritis   ? Asymptomatic varicose veins   ? Broken arm   ? Chronic kidney disease   ? Diabetic retinopathy ([place])   ? Edema   ? Hard of hearing   ? Hyperpotassemia   ? Hypertension   ? Inflammation of joint of knee   ? right knee  ? Insomnia, unspecified   ? Multiple thyroid nodules   ? Osteoarthrosis, unspecified whether generalized or localized, unspecified site   ? Other abnormal blood chemistry   ? Other and unspecified hyperlipidemia   ? Other malaise and fatigue   ? Other specified disease of nail   ? Overweight(278.02)   ? Pain in joint, lower leg   ? Pain in joint, pelvic region and thigh   ? Peripheral vascular disease ([place])   ? varicose veins-  "Blood Clot anterior left LEG WITH PREGNANCY"  ? PONV (postoperative nausea and vomiting)   ? pt. states that she was sick after hip surgery and oral surgery  ? Type II or unspecified type diabetes mellitus with renal manifestations, not stated as uncontrolled(250.40)   ? Undiagnosed cardiac murmurs   ? Unspecified disorder of kidney and ureter   ? Unspecified disorder of kidney and ureter   ? Urinary tract infection, site not specified   ? ? ?Past Surgical History:  ?Procedure Laterality Date  ? ABDOMINAL HYSTERECTOMY    ? EYE SURGERY Bilateral   ? cataract extraction with IOL  ? LIPOMA EXCISION    ? x2  bilateral buttocks  ? spinal meningitis  1957  ? TONSILLECTOMY    ? TOTAL HIP ARTHROPLASTY Right 11/29/2012  ? Procedure: RIGHT TOTAL HIP ARTHROPLASTY ANTERIOR APPROACH;   Surgeon: Mcarthur Rossetti, MD;  Location: [facility];  Service: Orthopedics;  Laterality: Right;  ? TOTAL HIP ARTHROPLASTY Left 06/01/2015  ? Procedure: LEFT TOTAL HIP ARTHROPLASTY ANTERIOR APPROACH;  Surgeon: Mcarthur Rossetti, MD;  Location: [facility];  Service: Orthopedics;  Laterality: Left;  ? ? ?Social History  ? ?Socioeconomic History  ? Marital status: Single  ?  Spouse name: Not on file  ? Number of children: Not on file  ? Years of education: Not on file  ? Highest education level: Not on file  ?Occupational History  ? Not on file  ?Tobacco Use  ? Smoking status: Never  ? Smokeless tobacco: Never  ?Vaping Use  ? Vaping Use: Never used  ?Substance and Sexual Activity  ? Alcohol use: No  ? Drug use: No  ? Sexual activity: Not on file  ?Other Topics Concern  ? Not on file  ?Social History Narrative  ? Not on file  ? ?Social Determinants of Health  ? ?  Financial Resource Strain: Not on file  ?Food Insecurity: Not on file  ?Transportation Needs: Not on file  ?Physical Activity: Not on file  ?Stress: Not on file  ?Social Connections: Not on file  ? ? ?History reviewed. No pertinent family history. ? ?Outpatient Encounter Medications as of 12/02/2021  ?Medication Sig  ? Accu-Chek FastClix Lancets MISC Test blood sugar twice daily DX E08.319  ? aspirin EC 81 MG tablet Take 81 mg by mouth daily.  ? diclofenac Sodium (VOLTAREN) 1 % GEL Apply 2 g topically 4 (four) times daily as needed.  ? glipiZIDE (GLUCOTROL) 5 MG tablet TAKE ONE TABLET BY MOUTH  TWICE DAILY BEFORE MEALS  (BREAKFAST AND LUNCH)  ? glucose blood (ACCU-CHEK SMARTVIEW) test strip USE TO CHECK BLOOD SUGAR  TWO TIMES DAILY Dx:E11.3593  ? insulin detemir (LEVEMIR FLEXPEN) 100 UNIT/ML FlexPen Inject 24 Units into the skin at bedtime.  ? Insulin Pen Needle 31G X 5 MM MISC 1 Device by Does not apply route at bedtime. E11.65  ? linagliptin (TRADJENTA) 5 MG TABS tablet Take 1 tablet (5 mg total) by mouth daily.  ? omeprazole (PRILOSEC OTC) 20 MG tablet Take 1  tablet (20 mg total) by mouth daily before supper.  ? Propylene Glycol (SYSTANE BALANCE OP) Apply 1 drop to eye daily as needed (dry eyes).  ? simvastatin (ZOCOR) 40 MG tablet TAKE 1 TABLET BY MOUTH  DAILY  ? [DISCONTINUED] glucose blood (ACCU-CHEK SMARTVIEW) test strip USE TO CHECK BLOOD SUGAR  TWO TIMES DAILY Dx:E11.3593  ? [DISCONTINUED] insulin detemir (LEVEMIR FLEXPEN) 100 UNIT/ML FlexPen Inject 15 Units into the skin at bedtime.  ? ?No facility-administered encounter medications on file as of 12/02/2021.  ? ? ?ALLERGIES: ?Allergies  ?Allergen Reactions  ? Elemental Sulfur Hives  ? Naproxen Swelling and Other (See Comments)  ?  Blacked out - Face swelling ?  ? Tramadol Nausea And Vomiting  ? ? ?VACCINATION STATUS: ?Immunization History  ?Administered Date(s) Administered  ? Fluad Quad(high Dose 65+) 06/19/2019  ? Influenza Whole 05/28/2012  ? Influenza, High Dose Seasonal PF 06/13/2018  ? Influenza,inj,Quad PF,6+ Mos 07/17/2016  ? Influenza-Unspecified 07/02/2017  ? Moderna Sars-Covid-2 Vaccination 05/21/2020  ? Pneumococcal Conjugate-13 11/06/2014  ? Pneumococcal Polysaccharide-23 02/23/2017  ? ? ?Diabetes ?She presents for her initial diabetic visit. She has type 2 diabetes mellitus. Onset time: She was diagnosed at approximate age of 28 years. Her disease course has been worsening. There are no hypoglycemic associated symptoms. Pertinent negatives for hypoglycemia include no confusion, headaches, pallor or seizures. Associated symptoms include fatigue, polydipsia and polyuria. Pertinent negatives for diabetes include no chest pain and no polyphagia. There are no hypoglycemic complications. Symptoms are worsening. Diabetic complications include nephropathy. Risk factors for coronary artery disease include dyslipidemia, post-menopausal, sedentary lifestyle and diabetes mellitus. Current diabetic treatment includes insulin injections (She is currently on Levemir 15 units nightly, Tradjenta 5 mg once a day,  glipizide 5 mg twice daily.). She is following a generally unhealthy diet. When asked about meal planning, she reported none. Her overall blood glucose range is >200 mg/dl. (She presents with a log showing significantly above target glycemic profile averaging greater than 200 mg per DL.  Her recent A1c was 10.1%.) An ACE inhibitor/angiotensin II receptor blocker is not being taken. Eye exam is current.  ?Hyperlipidemia ?This is a chronic problem. The current episode started more than 1 year ago. Exacerbating diseases include diabetes. Pertinent negatives include no chest pain, myalgias or shortness of breath. Current antihyperlipidemic treatment includes  statins. Risk factors for coronary artery disease include dyslipidemia, diabetes mellitus and a sedentary lifestyle.  ? ? ?Review of Systems  ?Constitutional:  Positive for fatigue. Negative for chills, fever and unexpected weight change.  ?HENT:  Negative for trouble swallowing and voice change.   ?Eyes:  Negative for visual disturbance.  ?Respiratory:  Negative for cough, shortness of breath and wheezing.   ?Cardiovascular:  Negative for chest pain, palpitations and leg swelling.  ?Gastrointestinal:  Negative for diarrhea, nausea and vomiting.  ?Endocrine: Positive for polydipsia and polyuria. Negative for cold intolerance, heat intolerance and polyphagia.  ?Musculoskeletal:  Negative for arthralgias and myalgias.  ?Skin:  Negative for color change, pallor, rash and wound.  ?Neurological:  Negative for seizures and headaches.  ?Psychiatric/Behavioral:  Negative for confusion and suicidal ideas.   ? ?Objective:  ?  ? ?  12/02/2021  ?  8:19 AM 12/01/2021  ?  8:51 AM 12/01/2021  ?  8:25 AM  ?Vitals with BMI  ?Height '5\' 4"'$   '5\' 4"'$   ?Weight 161 lbs 13 oz  161 lbs 3 oz  ?BMI 27.76  27.66  ?Systolic 268 341 962  ?Diastolic 86 84 80  ?Pulse 72  95  ? ? ?BP 126/86   Pulse 72   Ht '5\' 4"'$  (1.626 m)   Wt 161 lb 12.8 oz (73.4 kg)   BMI 27.77 kg/m?   ?Wt Readings from Last 3  Encounters:  ?12/02/21 161 lb 12.8 oz (73.4 kg)  ?12/01/21 161 lb 3.2 oz (73.1 kg)  ?11/11/21 162 lb (73.5 kg)  ?  ? ?Physical Exam ?Constitutional:   ?   Appearance: She is well-developed.  ?HENT:  ?   Head: Normocephalic

## 2021-12-02 NOTE — Assessment & Plan Note (Signed)
Has appt with Ren[facility]. Will follow closely. ?Avoid nephrotoxic medications. ?

## 2021-12-05 ENCOUNTER — Ambulatory Visit: Payer: Medicare Other | Admitting: Family

## 2021-12-05 ENCOUNTER — Ambulatory Visit: Payer: Medicare Other | Admitting: Nurse Practitioner

## 2021-12-06 ENCOUNTER — Telehealth (INDEPENDENT_AMBULATORY_CARE_PROVIDER_SITE_OTHER): Payer: Medicare Other | Admitting: Nurse Practitioner

## 2021-12-06 DIAGNOSIS — N184 Chronic kidney disease, stage 4 (severe): Secondary | ICD-10-CM | POA: Diagnosis not present

## 2021-12-06 DIAGNOSIS — E1165 Type 2 diabetes mellitus with hyperglycemia: Secondary | ICD-10-CM

## 2021-12-06 NOTE — Progress Notes (Signed)
? ? ?Careteam: ?Patient Care Team: ?Lauree Chandler, NP as PCP - General (Geriatric Medicine) ?Rankin, Clent Demark, MD as Consulting Physician (Ophthalmology) ? ?Advanced Directive information ?  ? ?Allergies  ?Allergen Reactions  ? Elemental Sulfur Hives  ? Naproxen Swelling and Other (See Comments)  ?  Blacked out - Face swelling ?  ? Tramadol Nausea And Vomiting  ? ? ?Chief Complaint  ?Patient presents with  ? Acute Visit  ?  Patient would like to discuss insulin. Patient experienced low blood sugar on the dose of insulin that other physician had given her. Patient went from taking 15 units to taking 24 units. Patient went to see another physician and was dissatisfied. Patient states that glucose readings are higher during the mid part of the day. Patient has been keeping record of numbers. Taking 15 units readings were in the high 300s. Patient glucose reading this morning was 100 with taking 15 units.  ? ? ? ?HPI: Patient is a 86 y.o. female for re-establish care.  ?Reports her blood sugar is coming down. She does not want to see the endocrinologist- he told her to increase insulin to 24 but she only increased to 18 and then her fasting blood sugar was 61. "If I would have listened to him I would have been in the hospital" ?She has decreased her insulin to 15 units. She reports her blood sugar this morning was 100.  ?She is also taking glipizide and tradjenta.  ?She is working so hard to eat better. No candy, cookies, ice-cream.  ?Doing a lot of research on what to eat.  ? ?She is planning to go to nephrologist tomorrow. ? ?She is not having any more lightheadness or dizzinesss.  ?She has increase her water intake.  ? ?Review of Systems:  ?Review of Systems  ?Constitutional:  Negative for chills and fever.  ?Eyes:  Negative for blurred vision.  ?Neurological:  Negative for tingling.  ? ?Past Medical History:  ?Diagnosis Date  ? Anxiety   ? regarding surgery; occassionally pt. states  ? Anxiety state, unspecified    ? Arthritis   ? Asymptomatic varicose veins   ? Broken arm   ? Chronic kidney disease   ? Diabetic retinopathy ([place])   ? Edema   ? Hard of hearing   ? Hyperpotassemia   ? Hypertension   ? Inflammation of joint of knee   ? right knee  ? Insomnia, unspecified   ? Multiple thyroid nodules   ? Osteoarthrosis, unspecified whether generalized or localized, unspecified site   ? Other abnormal blood chemistry   ? Other and unspecified hyperlipidemia   ? Other malaise and fatigue   ? Other specified disease of nail   ? Overweight(278.02)   ? Pain in joint, lower leg   ? Pain in joint, pelvic region and thigh   ? Peripheral vascular disease ([place])   ? varicose veins-  "Blood Clot anterior left LEG WITH PREGNANCY"  ? PONV (postoperative nausea and vomiting)   ? pt. states that she was sick after hip surgery and oral surgery  ? Type II or unspecified type diabetes mellitus with renal manifestations, not stated as uncontrolled(250.40)   ? Undiagnosed cardiac murmurs   ? Unspecified disorder of kidney and ureter   ? Unspecified disorder of kidney and ureter   ? Urinary tract infection, site not specified   ? ?Past Surgical History:  ?Procedure Laterality Date  ? ABDOMINAL HYSTERECTOMY    ? EYE SURGERY Bilateral   ?  cataract extraction with IOL  ? LIPOMA EXCISION    ? x2  bilateral buttocks  ? spinal meningitis  1957  ? TONSILLECTOMY    ? TOTAL HIP ARTHROPLASTY Right 11/29/2012  ? Procedure: RIGHT TOTAL HIP ARTHROPLASTY ANTERIOR APPROACH;  Surgeon: Mcarthur Rossetti, MD;  Location: [facility];  Service: Orthopedics;  Laterality: Right;  ? TOTAL HIP ARTHROPLASTY Left 06/01/2015  ? Procedure: LEFT TOTAL HIP ARTHROPLASTY ANTERIOR APPROACH;  Surgeon: Mcarthur Rossetti, MD;  Location: [facility];  Service: Orthopedics;  Laterality: Left;  ? ?Social History: ?  reports that she has never smoked. She has never used smokeless tobacco. She reports that she does not drink alcohol and does not use drugs. ? ?No family history on  file. ? ?Medications: ?Patient's Medications  ?New Prescriptions  ? No medications on file  ?Previous Medications  ? ACCU-CHEK FASTCLIX LANCETS MISC    Test blood sugar twice daily DX E08.319  ? ASPIRIN EC 81 MG TABLET    Take 81 mg by mouth daily.  ? DICLOFENAC SODIUM (VOLTAREN) 1 % GEL    Apply 2 g topically 4 (four) times daily as needed.  ? GLIPIZIDE (GLUCOTROL) 5 MG TABLET    TAKE ONE TABLET BY MOUTH  TWICE DAILY BEFORE MEALS  (BREAKFAST AND LUNCH)  ? GLUCOSE BLOOD (ACCU-CHEK SMARTVIEW) TEST STRIP    USE TO CHECK BLOOD SUGAR  TWO TIMES DAILY Dx:E11.3593  ? INSULIN DETEMIR (LEVEMIR FLEXPEN) 100 UNIT/ML FLEXPEN    Inject 24 Units into the skin at bedtime.  ? INSULIN PEN NEEDLE 31G X 5 MM MISC    1 Device by Does not apply route at bedtime. E11.65  ? LINAGLIPTIN (TRADJENTA) 5 MG TABS TABLET    Take 1 tablet (5 mg total) by mouth daily.  ? OMEPRAZOLE (PRILOSEC OTC) 20 MG TABLET    Take 1 tablet (20 mg total) by mouth daily before supper.  ? PROPYLENE GLYCOL (SYSTANE BALANCE OP)    Apply 1 drop to eye daily as needed (dry eyes).  ? SIMVASTATIN (ZOCOR) 40 MG TABLET    TAKE 1 TABLET BY MOUTH  DAILY  ?Modified Medications  ? No medications on file  ?Discontinued Medications  ? No medications on file  ? ? ?Physical Exam: ? ?There were no vitals filed for this visit. ?There is no height or weight on file to calculate BMI. ?Wt Readings from Last 3 Encounters:  ?12/02/21 161 lb 12.8 oz (73.4 kg)  ?12/01/21 161 lb 3.2 oz (73.1 kg)  ?11/11/21 162 lb (73.5 kg)  ? ? ? ?Labs reviewed: ?Basic Metabolic Panel: ?Recent Labs  ?  06/03/21 ?1013 11/11/21 ?1056 11/22/21 ?0811  ?NA 137 135 133*  ?K 4.9 5.6* 5.2  ?CL 101 101 101  ?CO2 24 19* 19*  ?GLUCOSE 150* 258* 330*  ?BUN 27* 34* 45*  ?CREATININE 1.69* 2.40* 2.49*  ?CALCIUM 9.3 9.3 9.4  ?TSH  --  1.08  --   ? ?Liver Function Tests: ?Recent Labs  ?  06/03/21 ?1013 11/11/21 ?1056  ?AST 13 10  ?ALT 8 9  ?BILITOT 0.3 0.3  ?PROT 6.6 6.7  ? ?No results for input(s): LIPASE, AMYLASE in the  last 8760 hours. ?No results for input(s): AMMONIA in the last 8760 hours. ?CBC: ?Recent Labs  ?  06/03/21 ?1013 06/21/21 ?2878 11/11/21 ?1056  ?WBC 11.5* 11.2* 11.3*  ?[place] 6,767 2,094  ?HGB 10.8* 10.7* 10.4*  ?HCT 32.7* 32.4* 31.7*  ?MCV 92.4 92.8 92.7  ?PLT 312 295  321  ? ?Lipid Panel: ?Recent Labs  ?  06/03/21 ?1013 11/11/21 ?1056  ?CHOL 116 126  ?HDL 47* 50  ?LDLCALC 45 53  ?TRIG 163* 157*  ?CHOLHDL 2.5 2.5  ? ?TSH: ?Recent Labs  ?  11/11/21 ?1056  ?TSH 1.08  ? ?A1C: ?Lab Results  ?Component Value Date  ? HGBA1C 10.1 (H) 11/11/2021  ? ? ? ?Assessment/Plan ?1. Uncontrolled type 2 diabetes mellitus with hyperglycemia ([place]) ?-adamant about not following up with endocrinologist again. She would like to continue to plan we had discussed and she continues to work on her dietary modifications.  ?No hypoglycemic episodes and blood sugars are trending down. She has seen most blood sugars after meals 200s. At this time and fasting blood sugars 70-110s  ? ?2. Stage 4 chronic kidney disease ([place]) ?Has appt to establish care with nephrologist tomorrow.  ?Encourage proper hydration ?Follow metabolic panel ?Avoid nephrotoxic meds (NSAIDS) ? ? ?Next appt: 4 weeks for blood sugar review.  ?Carlos American. Dewaine Oats, AGNP ? ?[facility] ?[phone]  ? ? ?Virtual Visit via telephone- unable to do mychart  ? ?I connected with patient on 12/06/21 at  8:40 AM EDT by telephone and verified that I am speaking with the correct person using two identifiers. ? ?Location: ?Patient: home ?Provider: [facility] ?  ?I discussed the limitations, risks, security and privacy concerns of performing an evaluation and management service by telephone and the availability of in person appointments. I also discussed with the patient that there may be a patient responsible charge related to this service. The patient expressed understanding and agreed to proceed. ? ? ?I discussed the assessment and treatment plan with the  patient. The patient was provided an opportunity to ask questions and all were answered. The patient agreed with the plan and demonstrated an understanding of the instructions. ?  ?The patient was advised to cal

## 2021-12-06 NOTE — Progress Notes (Signed)
This service is provided via telemedicine ? ?No vital signs collected/recorded due to the encounter was a telemedicine visit.  ? ?Location of patient (ex: home, work):  Home ? ?Patient consents to a telephone visit:  Yes, see encounter dated 11/14/2021 ? ?Location of the provider (ex: office, home):  [place] ? ?Name of any referring provider:  N/A ? ?Names of all persons participating in the telemedicine service and their role in the encounter:  Sherrie Mustache, Nurse Practitioner, Carroll Kinds, CMA, and patient.   ? ?Time spent on call:  10 minutes with medical assistant ? ?

## 2021-12-07 ENCOUNTER — Other Ambulatory Visit (HOSPITAL_COMMUNITY): Payer: Self-pay | Admitting: Nephrology

## 2021-12-07 ENCOUNTER — Other Ambulatory Visit: Payer: Self-pay | Admitting: Nephrology

## 2021-12-07 DIAGNOSIS — D638 Anemia in other chronic diseases classified elsewhere: Secondary | ICD-10-CM | POA: Diagnosis not present

## 2021-12-07 DIAGNOSIS — Z79899 Other long term (current) drug therapy: Secondary | ICD-10-CM | POA: Diagnosis not present

## 2021-12-07 DIAGNOSIS — N170 Acute kidney failure with tubular necrosis: Secondary | ICD-10-CM | POA: Diagnosis not present

## 2021-12-07 DIAGNOSIS — E8721 Acute metabolic acidosis: Secondary | ICD-10-CM | POA: Diagnosis not present

## 2021-12-07 DIAGNOSIS — I129 Hypertensive chronic kidney disease with stage 1 through stage 4 chronic kidney disease, or unspecified chronic kidney disease: Secondary | ICD-10-CM

## 2021-12-07 DIAGNOSIS — E1122 Type 2 diabetes mellitus with diabetic chronic kidney disease: Secondary | ICD-10-CM

## 2021-12-07 DIAGNOSIS — E871 Hypo-osmolality and hyponatremia: Secondary | ICD-10-CM | POA: Diagnosis not present

## 2021-12-07 DIAGNOSIS — N189 Chronic kidney disease, unspecified: Secondary | ICD-10-CM | POA: Diagnosis not present

## 2021-12-08 ENCOUNTER — Ambulatory Visit: Payer: Medicare Other | Admitting: Family Medicine

## 2021-12-08 ENCOUNTER — Other Ambulatory Visit: Payer: Self-pay | Admitting: *Deleted

## 2021-12-08 DIAGNOSIS — E1165 Type 2 diabetes mellitus with hyperglycemia: Secondary | ICD-10-CM

## 2021-12-08 MED ORDER — LINAGLIPTIN 5 MG PO TABS: 5.0000 mg | ORAL_TABLET | Freq: Every day | ORAL | 1 refills | Status: DC

## 2021-12-08 NOTE — Telephone Encounter (Signed)
Patient requested refill to be sent to [facility].  ?

## 2021-12-13 ENCOUNTER — Ambulatory Visit: Payer: Medicare Other | Admitting: "Endocrinology

## 2021-12-13 ENCOUNTER — Encounter: Payer: Self-pay | Admitting: *Deleted

## 2021-12-20 ENCOUNTER — Ambulatory Visit (HOSPITAL_COMMUNITY)
Admission: RE | Admit: 2021-12-20 | Discharge: 2021-12-20 | Disposition: A | Discharge status: Home / Self Care | Payer: Medicare Other | Source: Ambulatory Visit | Attending: Nephrology | Admitting: Nephrology

## 2021-12-20 DIAGNOSIS — Z79899 Other long term (current) drug therapy: Secondary | ICD-10-CM | POA: Diagnosis not present

## 2021-12-20 DIAGNOSIS — E871 Hypo-osmolality and hyponatremia: Secondary | ICD-10-CM | POA: Diagnosis not present

## 2021-12-20 DIAGNOSIS — D638 Anemia in other chronic diseases classified elsewhere: Secondary | ICD-10-CM | POA: Diagnosis not present

## 2021-12-20 DIAGNOSIS — E1122 Type 2 diabetes mellitus with diabetic chronic kidney disease: Secondary | ICD-10-CM | POA: Insufficient documentation

## 2021-12-20 DIAGNOSIS — N189 Chronic kidney disease, unspecified: Secondary | ICD-10-CM | POA: Diagnosis not present

## 2021-12-20 DIAGNOSIS — N170 Acute kidney failure with tubular necrosis: Secondary | ICD-10-CM | POA: Insufficient documentation

## 2021-12-20 DIAGNOSIS — E8721 Acute metabolic acidosis: Secondary | ICD-10-CM | POA: Diagnosis not present

## 2021-12-20 DIAGNOSIS — I129 Hypertensive chronic kidney disease with stage 1 through stage 4 chronic kidney disease, or unspecified chronic kidney disease: Secondary | ICD-10-CM | POA: Diagnosis not present

## 2021-12-22 NOTE — Progress Notes (Signed)
? ? ?Careteam: ?Patient Care Team: ?Lauree Chandler, NP as PCP - General (Geriatric Medicine) ?Rankin, Clent Demark, MD as Consulting Physician (Ophthalmology) ? ?PLACE OF SERVICE:  ?[facility]  ?Advanced Directive information ?Does Patient Have a Medical Advance Directive?: Yes, Type of Advance Directive: Out of facility DNR (pink MOST or yellow form), Pre-existing out of facility DNR order (yellow form or pink MOST form): Yellow form placed in chart (order not valid for inpatient use), Does patient want to make changes to medical advance directive?: No - Patient declined ? ?Allergies  ?Allergen Reactions  ? Elemental Sulfur Hives  ? Naproxen Swelling and Other (See Comments)  ?  Blacked out - Face swelling ?  ? Tramadol Nausea And Vomiting  ? ? ?Chief Complaint  ?Patient presents with  ? Follow-up  ?  Blood sugar follow-up. Patient denies receiving any vaccines since last visit.  ?  ? ? ? ?HPI: Patient is a 86 y.o. female for follow up on blood sugar ? ?Reports she saw her a nephrologist and he was wonderful. Very excited about seeing him.  ? ?She feels great, blood pressure has been better.  ? ?Blood sugar ranging from 61, reports blood sugar 300s prior to bed.  ?She had strawberry pie and fried fish and blood sugar went to 500. But in the morning blood sugars in the 60s. She feels shaky and weak during that time ?Trying to eat more protein but counting her protein, sometimes she is still  ? hungry  ? ? ?Review of Systems:  ?Review of Systems  ?Constitutional:  Negative for chills, fever and weight loss.  ?HENT:  Negative for tinnitus.   ?Respiratory:  Negative for cough, sputum production and shortness of breath.   ?Cardiovascular:  Negative for chest pain, palpitations and leg swelling.  ?Gastrointestinal:  Negative for abdominal pain, constipation, diarrhea and heartburn.  ?Genitourinary:  Negative for dysuria, frequency and urgency.  ?Musculoskeletal:  Negative for back pain, falls, joint pain and myalgias.   ?Skin: Negative.   ?Neurological:  Negative for dizziness and headaches.  ?Psychiatric/Behavioral:  Negative for depression and memory loss. The patient does not have insomnia.   ?Past Medical History:  ?Diagnosis Date  ? Anxiety   ? regarding surgery; occassionally pt. states  ? Anxiety state, unspecified   ? Arthritis   ? Asymptomatic varicose veins   ? Broken arm   ? Chronic kidney disease   ? Diabetic retinopathy ([place])   ? Edema   ? Hard of hearing   ? Hyperpotassemia   ? Hypertension   ? Inflammation of joint of knee   ? right knee  ? Insomnia, unspecified   ? Multiple thyroid nodules   ? Osteoarthrosis, unspecified whether generalized or localized, unspecified site   ? Other abnormal blood chemistry   ? Other and unspecified hyperlipidemia   ? Other malaise and fatigue   ? Other specified disease of nail   ? Overweight(278.02)   ? Pain in joint, lower leg   ? Pain in joint, pelvic region and thigh   ? Peripheral vascular disease ([place])   ? varicose veins-  "Blood Clot anterior left LEG WITH PREGNANCY"  ? PONV (postoperative nausea and vomiting)   ? pt. states that she was sick after hip surgery and oral surgery  ? Type II or unspecified type diabetes mellitus with renal manifestations, not stated as uncontrolled(250.40)   ? Undiagnosed cardiac murmurs   ? Unspecified disorder of kidney and ureter   ? Unspecified  disorder of kidney and ureter   ? Urinary tract infection, site not specified   ? ?Past Surgical History:  ?Procedure Laterality Date  ? ABDOMINAL HYSTERECTOMY    ? EYE SURGERY Bilateral   ? cataract extraction with IOL  ? LIPOMA EXCISION    ? x2  bilateral buttocks  ? spinal meningitis  1957  ? TONSILLECTOMY    ? TOTAL HIP ARTHROPLASTY Right 11/29/2012  ? Procedure: RIGHT TOTAL HIP ARTHROPLASTY ANTERIOR APPROACH;  Surgeon: Mcarthur Rossetti, MD;  Location: [facility];  Service: Orthopedics;  Laterality: Right;  ? TOTAL HIP ARTHROPLASTY Left 06/01/2015  ? Procedure: LEFT TOTAL HIP ARTHROPLASTY ANTERIOR  APPROACH;  Surgeon: Mcarthur Rossetti, MD;  Location: [place];  Service: Orthopedics;  Laterality: Left;  ? ?Social History: ?  reports that she has never smoked. She has never used smokeless tobacco. She reports that she does not drink alcohol and does not use drugs. ? ?History reviewed. No pertinent family history. ? ?Medications: ?Patient's Medications  ?New Prescriptions  ? No medications on file  ?Previous Medications  ? ACCU-CHEK FASTCLIX LANCETS MISC    Test blood sugar twice daily DX E08.319  ? ASPIRIN EC 81 MG TABLET    Take 81 mg by mouth daily.  ? DICLOFENAC SODIUM (VOLTAREN) 1 % GEL    Apply 2 g topically 4 (four) times daily as needed.  ? GLIPIZIDE (GLUCOTROL) 5 MG TABLET    TAKE ONE TABLET BY MOUTH  TWICE DAILY BEFORE MEALS  (BREAKFAST AND LUNCH)  ? GLUCOSE BLOOD (ACCU-CHEK SMARTVIEW) TEST STRIP    USE TO CHECK BLOOD SUGAR  TWO TIMES DAILY Dx:E11.3593  ? INSULIN DETEMIR (LEVEMIR FLEXPEN) 100 UNIT/ML FLEXPEN    Inject 10 Units into the skin daily. 7 pm  ? INSULIN PEN NEEDLE 31G X 5 MM MISC    1 Device by Does not apply route at bedtime. E11.65  ? LINAGLIPTIN (TRADJENTA) 5 MG TABS TABLET    Take 1 tablet (5 mg total) by mouth daily.  ? OMEPRAZOLE (PRILOSEC OTC) 20 MG TABLET    Take 1 tablet (20 mg total) by mouth daily before supper.  ? PROPYLENE GLYCOL (SYSTANE BALANCE OP)    Apply 1 drop to eye daily as needed (dry eyes).  ? SIMVASTATIN (ZOCOR) 40 MG TABLET    TAKE 1 TABLET BY MOUTH  DAILY  ?Modified Medications  ? No medications on file  ?Discontinued Medications  ? INSULIN DETEMIR (LEVEMIR FLEXPEN) 100 UNIT/ML FLEXPEN    Inject 24 Units into the skin at bedtime.  ? ? ?Physical Exam: ? ?Vitals:  ? 12/23/21 0833  ?BP: 132/88  ?Pulse: 96  ?Temp: (!) 97.1 ?F (36.2 ?C)  ?TempSrc: Temporal  ?SpO2: 99%  ?Weight: 163 lb (73.9 kg)  ?Height: '5\' 4"'$  (1.626 m)  ? ?Body mass index is 27.98 kg/m?. ?Wt Readings from Last 3 Encounters:  ?12/23/21 163 lb (73.9 kg)  ?12/02/21 161 lb 12.8 oz (73.4 kg)  ?12/01/21 161  lb 3.2 oz (73.1 kg)  ? ? ?Physical Exam ?Constitutional:   ?   General: She is not in acute distress. ?   Appearance: She is well-developed. She is not diaphoretic.  ?HENT:  ?   Head: Normocephalic and atraumatic.  ?   Mouth/Throat:  ?   Pharynx: No oropharyngeal exudate.  ?Eyes:  ?   Conjunctiva/sclera: Conjunctivae normal.  ?   Pupils: Pupils are equal, round, and reactive to light.  ?Cardiovascular:  ?   Rate and Rhythm: Normal  rate and regular rhythm.  ?   Heart sounds: Normal heart sounds.  ?Pulmonary:  ?   Effort: Pulmonary effort is normal.  ?   Breath sounds: Normal breath sounds.  ?Abdominal:  ?   General: Bowel sounds are normal.  ?   Palpations: Abdomen is soft.  ?Musculoskeletal:  ?   Cervical back: Normal range of motion and neck supple.  ?   Right lower leg: No edema.  ?   Left lower leg: No edema.  ?Skin: ?   General: Skin is warm and dry.  ?Neurological:  ?   Mental Status: She is alert.  ?Psychiatric:     ?   Mood and Affect: Mood normal.  ? ? ?Labs reviewed: ?Basic Metabolic Panel: ?Recent Labs  ?  06/03/21 ?1013 11/11/21 ?1056 11/22/21 ?0811  ?NA 137 135 133*  ?K 4.9 5.6* 5.2  ?CL 101 101 101  ?CO2 24 19* 19*  ?GLUCOSE 150* 258* 330*  ?BUN 27* 34* 45*  ?CREATININE 1.69* 2.40* 2.49*  ?CALCIUM 9.3 9.3 9.4  ?TSH  --  1.08  --   ? ?Liver Function Tests: ?Recent Labs  ?  06/03/21 ?1013 11/11/21 ?1056  ?AST 13 10  ?ALT 8 9  ?BILITOT 0.3 0.3  ?PROT 6.6 6.7  ? ?No results for input(s): LIPASE, AMYLASE in the last 8760 hours. ?No results for input(s): AMMONIA in the last 8760 hours. ?CBC: ?Recent Labs  ?  06/03/21 ?1013 06/21/21 ?1610 11/11/21 ?1056  ?WBC 11.5* 11.2* 11.3*  ?[place] 9,604 5,409  ?HGB 10.8* 10.7* 10.4*  ?HCT 32.7* 32.4* 31.7*  ?MCV 92.4 92.8 92.7  ?PLT 312 295 321  ? ?Lipid Panel: ?Recent Labs  ?  06/03/21 ?1013 11/11/21 ?1056  ?CHOL 116 126  ?HDL 47* 50  ?LDLCALC 45 53  ?TRIG 163* 157*  ?CHOLHDL 2.5 2.5  ? ?TSH: ?Recent Labs  ?  11/11/21 ?1056  ?TSH 1.08  ? ?A1C: ?Lab Results   ?Component Value Date  ? HGBA1C 10.1 (H) 11/11/2021  ? ? ? ?Assessment/Plan ?1. Type 2 diabetes mellitus with stage 4 chronic kidney disease, with long-term current use of insulin ([place]) ?-blood sugars are var

## 2021-12-23 ENCOUNTER — Encounter: Payer: Self-pay | Admitting: Nurse Practitioner

## 2021-12-23 ENCOUNTER — Ambulatory Visit (INDEPENDENT_AMBULATORY_CARE_PROVIDER_SITE_OTHER): Payer: Medicare Other | Admitting: Nurse Practitioner

## 2021-12-23 VITALS — BP 132/88 | HR 96 | Temp 97.1°F | Ht 64.0 in | Wt 163.0 lb

## 2021-12-23 DIAGNOSIS — E1122 Type 2 diabetes mellitus with diabetic chronic kidney disease: Secondary | ICD-10-CM

## 2021-12-23 DIAGNOSIS — N184 Chronic kidney disease, stage 4 (severe): Secondary | ICD-10-CM | POA: Diagnosis not present

## 2021-12-23 DIAGNOSIS — Z794 Long term (current) use of insulin: Secondary | ICD-10-CM | POA: Diagnosis not present

## 2021-12-23 DIAGNOSIS — I951 Orthostatic hypotension: Secondary | ICD-10-CM | POA: Diagnosis not present

## 2021-12-23 DIAGNOSIS — Z66 Do not resuscitate: Secondary | ICD-10-CM

## 2021-12-23 NOTE — Patient Instructions (Signed)
To eat things like chicken, cheese, egg, vegetables if you are hungry and worried about blood sugar.  ? ?Continue water intake. ? ?Okay to change levemir to 5 mg twice daily - morning and evening ? ? ?

## 2022-01-02 ENCOUNTER — Ambulatory Visit: Payer: Medicare Other | Admitting: Nurse Practitioner

## 2022-01-06 DIAGNOSIS — N281 Cyst of kidney, acquired: Secondary | ICD-10-CM | POA: Diagnosis not present

## 2022-01-06 DIAGNOSIS — R809 Proteinuria, unspecified: Secondary | ICD-10-CM | POA: Diagnosis not present

## 2022-01-06 DIAGNOSIS — E211 Secondary hyperparathyroidism, not elsewhere classified: Secondary | ICD-10-CM | POA: Diagnosis not present

## 2022-01-06 DIAGNOSIS — D631 Anemia in chronic kidney disease: Secondary | ICD-10-CM | POA: Diagnosis not present

## 2022-01-06 DIAGNOSIS — E1122 Type 2 diabetes mellitus with diabetic chronic kidney disease: Secondary | ICD-10-CM | POA: Diagnosis not present

## 2022-01-06 DIAGNOSIS — D472 Monoclonal gammopathy: Secondary | ICD-10-CM | POA: Diagnosis not present

## 2022-01-06 DIAGNOSIS — E8722 Chronic metabolic acidosis: Secondary | ICD-10-CM | POA: Diagnosis not present

## 2022-01-06 DIAGNOSIS — N189 Chronic kidney disease, unspecified: Secondary | ICD-10-CM | POA: Diagnosis not present

## 2022-01-06 DIAGNOSIS — N19 Unspecified kidney failure: Secondary | ICD-10-CM | POA: Diagnosis not present

## 2022-01-06 DIAGNOSIS — E1129 Type 2 diabetes mellitus with other diabetic kidney complication: Secondary | ICD-10-CM | POA: Diagnosis not present

## 2022-01-18 ENCOUNTER — Other Ambulatory Visit: Payer: Self-pay | Admitting: Family

## 2022-01-18 ENCOUNTER — Other Ambulatory Visit: Payer: Self-pay | Admitting: *Deleted

## 2022-01-18 MED ORDER — BD PEN NEEDLE MINI U/F 31G X 5 MM MISC: 3 refills | Status: DC

## 2022-01-18 NOTE — Telephone Encounter (Signed)
Patient called requesting refill on Pen Needles. Stated that Julie Baxter changed the frequency at last OV.  OV Note Dated: 12/23/2021 Assessment/Plan 1. Type 2 diabetes mellitus with stage 4 chronic kidney disease, with long-term current use of insulin (HCC) -blood sugars are variable. High up until the early AM hours and then she wakes up with blood sugars in the 60s. She would like to try doing levemir 5 units twice daily (am and pm), agreeable to try this, educated she Julie Baxter need to use take levemir in the morning. She will continue to work on diet, low sugar and increase protein. She has been working hard at diet control.

## 2022-01-25 ENCOUNTER — Other Ambulatory Visit: Payer: Self-pay

## 2022-01-25 DIAGNOSIS — E663 Overweight: Secondary | ICD-10-CM

## 2022-01-25 MED ORDER — ACCU-CHEK GUIDE ME W/DEVICE KIT: 1.0000 | PACK | 0 refills | Status: DC

## 2022-02-03 NOTE — Patient Instructions (Signed)
Please contact your local pharmacy, previous provider, or insurance carrier for vaccine/immunization records. Ensure that any procedures done outside of [facility] are faxed to [place] [phone] or you can sign release of records form at the front desk to keep your medical record updated.     Please have Dr Zadie Rhine send [place] your update eye exam   Schedule lab work next week

## 2022-02-06 ENCOUNTER — Ambulatory Visit (INDEPENDENT_AMBULATORY_CARE_PROVIDER_SITE_OTHER): Payer: Medicare Other | Admitting: Nurse Practitioner

## 2022-02-06 ENCOUNTER — Encounter: Payer: Self-pay | Admitting: Nurse Practitioner

## 2022-02-06 VITALS — BP 138/84 | HR 77 | Temp 97.1°F | Resp 16 | Ht 64.0 in | Wt 169.6 lb

## 2022-02-06 DIAGNOSIS — E1165 Type 2 diabetes mellitus with hyperglycemia: Secondary | ICD-10-CM

## 2022-02-06 DIAGNOSIS — I951 Orthostatic hypotension: Secondary | ICD-10-CM

## 2022-02-06 DIAGNOSIS — I10 Essential (primary) hypertension: Secondary | ICD-10-CM | POA: Diagnosis not present

## 2022-02-06 DIAGNOSIS — N184 Chronic kidney disease, stage 4 (severe): Secondary | ICD-10-CM | POA: Diagnosis not present

## 2022-02-06 DIAGNOSIS — E663 Overweight: Secondary | ICD-10-CM

## 2022-02-06 LAB — GLUCOSE, POCT (MANUAL RESULT ENTRY): POC Glucose: 201 mg/dl — AB (ref 70–99)

## 2022-02-06 MED ORDER — LEVEMIR FLEXPEN 100 UNIT/ML ~~LOC~~ SOPN: 20.0000 [IU] | PEN_INJECTOR | Freq: Every day | SUBCUTANEOUS | 1 refills | Status: DC

## 2022-02-06 MED ORDER — BD PEN NEEDLE MINI U/F 31G X 5 MM MISC: 3 refills | Status: DC

## 2022-02-06 NOTE — Progress Notes (Signed)
Careteam: Patient Care Team: Lauree Chandler, NP as PCP - General (Geriatric Medicine) Zadie Rhine Clent Demark, MD as Consulting Physician (Ophthalmology)  PLACE OF SERVICE:  [place] Directive information Does Patient Have a Medical Advance Directive?: Yes, Type of Advance Directive: Out of facility DNR (pink MOST or yellow form), Pre-existing out of facility DNR order (yellow form or pink MOST form): Yellow form placed in chart (order not valid for inpatient use), Does patient want to make changes to medical advance directive?: No - Patient declined  Allergies  Allergen Reactions   Elemental Sulfur Hives   Naproxen Swelling and Other (See Comments)    Blacked out - Face swelling    Tramadol Nausea And Vomiting    Chief Complaint  Patient presents with   Follow-up   Medical Management of Chronic Issues    6 week follow up on blood sugars.    Health Maintenance    Discuss the need for Eye exam   Immunizations    Discuss the need for Shingrix vaccine, and 2nd Covid vaccine     HPI: Patient is a 86 y.o. female for follow up blood sugar Fasting blood sugars that she can remember was 87, 105. 59,  57  She reports if blood sugar is a little down her blood sugars are general well controlled during the day.  She is taking levemir 21 units daily in the morning, she slowly increased until blood sugars well controlled.  She is also taking tradjenta, glipizide twice daily.  So feeling of hypoglycemia Eating better at this time.   She is followed by nephrology and has follow up at the end of next month She also had her A1c updated on 01/06/22 and it was 9.5 She plans to have Dr Theador Hawthorne recheck her A1c    Review of Systems:  Review of Systems  Constitutional:  Negative for chills, fever and weight loss.  HENT:  Negative for tinnitus.   Respiratory:  Negative for cough, sputum production and shortness of breath.   Cardiovascular:  Negative for chest pain, palpitations  and leg swelling.  Gastrointestinal:  Negative for abdominal pain, constipation, diarrhea and heartburn.  Genitourinary:  Negative for dysuria, frequency and urgency.  Musculoskeletal:  Negative for back pain, falls, joint pain and myalgias.  Skin: Negative.   Neurological:  Negative for dizziness and headaches.  Psychiatric/Behavioral:  Negative for depression and memory loss. The patient does not have insomnia.     Past Medical History:  Diagnosis Date   Anxiety    regarding surgery; occassionally pt. states   Anxiety state, unspecified    Arthritis    Asymptomatic varicose veins    Broken arm    Chronic kidney disease    Diabetic retinopathy ([place])    Edema    Hard of hearing    Hyperpotassemia    Hypertension    Inflammation of joint of knee    right knee   Insomnia, unspecified    Multiple thyroid nodules    Osteoarthrosis, unspecified whether generalized or localized, unspecified site    Other abnormal blood chemistry    Other and unspecified hyperlipidemia    Other malaise and fatigue    Other specified disease of nail    Overweight(278.02)    Pain in joint, lower leg    Pain in joint, pelvic region and thigh    Peripheral vascular disease ([place])    varicose veins-  "Blood Clot anterior left LEG WITH PREGNANCY"   PONV (  postoperative nausea and vomiting)    pt. states that she was sick after hip surgery and oral surgery   Type II or unspecified type diabetes mellitus with renal manifestations, not stated as uncontrolled(250.40)    Undiagnosed cardiac murmurs    Unspecified disorder of kidney and ureter    Unspecified disorder of kidney and ureter    Urinary tract infection, site not specified    Past Surgical History:  Procedure Laterality Date   ABDOMINAL HYSTERECTOMY     EYE SURGERY Bilateral    cataract extraction with IOL   LIPOMA EXCISION     x2  bilateral buttocks   spinal meningitis  1957   TONSILLECTOMY     TOTAL HIP ARTHROPLASTY Right 11/29/2012    Procedure: RIGHT TOTAL HIP ARTHROPLASTY ANTERIOR APPROACH;  Surgeon: Mcarthur Rossetti, MD;  Location: [facility];  Service: Orthopedics;  Laterality: Right;   TOTAL HIP ARTHROPLASTY Left 06/01/2015   Procedure: LEFT TOTAL HIP ARTHROPLASTY ANTERIOR APPROACH;  Surgeon: Mcarthur Rossetti, MD;  Location: [place];  Service: Orthopedics;  Laterality: Left;   Social History:   reports that she has never smoked. She has never used smokeless tobacco. She reports that she does not drink alcohol and does not use drugs.  No family history on file.  Medications: Patient's Medications  New Prescriptions   No medications on file  Previous Medications   ASPIRIN EC 81 MG TABLET    Take 81 mg by mouth daily.   BLOOD GLUCOSE MONITORING SUPPL (ACCU-CHEK GUIDE ME) W/DEVICE KIT    1 kit by Does not apply route as directed. ACCU CHEK GUIDE ME TEST STRIPS WITH KIT 180 strips, with 4 refills.   DICLOFENAC SODIUM (VOLTAREN) 1 % GEL    Apply 2 g topically 4 (four) times daily as needed.   GLIPIZIDE (GLUCOTROL) 5 MG TABLET    TAKE ONE TABLET BY MOUTH  TWICE DAILY BEFORE MEALS  (BREAKFAST AND LUNCH)   GLUCOSE BLOOD (ACCU-CHEK INSTANT GLUCOSE TEST VI)    100 strips by In Vitro route as directed.   GLUCOSE BLOOD TEST STRIP    1 each by Other route as needed. Use as instructed   INSULIN DETEMIR (LEVEMIR FLEXPEN) 100 UNIT/ML FLEXPEN    Inject 10 Units into the skin daily. 7 pm   INSULIN PEN NEEDLE (B-D UF III MINI PEN NEEDLES) 31G X 5 MM MISC    Use to give insulin twice daily. Dx:E11.22   LINAGLIPTIN (TRADJENTA) 5 MG TABS TABLET    Take 1 tablet (5 mg total) by mouth daily.   PROPYLENE GLYCOL (SYSTANE BALANCE OP)    Apply 1 drop to eye daily as needed (dry eyes).   SIMVASTATIN (ZOCOR) 40 MG TABLET    TAKE 1 TABLET BY MOUTH  DAILY   SODIUM BICARBONATE 650 MG TABLET    Take 1 tablet by mouth 3 (three) times daily.  Modified Medications   No medications on file  Discontinued Medications   OMEPRAZOLE (PRILOSEC OTC) 20  MG TABLET    Take 1 tablet (20 mg total) by mouth daily before supper.    Physical Exam:  Vitals:   02/06/22 0836 02/06/22 0852  BP: (!) 160/110 138/84  Pulse: 77   Resp: 16   Temp: (!) 97.1 F (36.2 C)   SpO2: 96%   Weight: 169 lb 9.6 oz (76.9 kg)   Height: 5' 4" (1.626 m)    Body mass index is 29.11 kg/m. Wt Readings from Last 3 Encounters:  02/06/22 169 lb 9.6 oz (76.9 kg)  12/23/21 163 lb (73.9 kg)  12/02/21 161 lb 12.8 oz (73.4 kg)    Physical Exam Constitutional:      General: She is not in acute distress.    Appearance: She is well-developed. She is not diaphoretic.  HENT:     Head: Normocephalic and atraumatic.     Mouth/Throat:     Pharynx: No oropharyngeal exudate.  Eyes:     Conjunctiva/sclera: Conjunctivae normal.     Pupils: Pupils are equal, round, and reactive to light.  Cardiovascular:     Rate and Rhythm: Normal rate and regular rhythm.     Heart sounds: Normal heart sounds.  Pulmonary:     Effort: Pulmonary effort is normal.     Breath sounds: Normal breath sounds.  Abdominal:     General: Bowel sounds are normal.     Palpations: Abdomen is soft.  Musculoskeletal:     Cervical back: Normal range of motion and neck supple.     Right lower leg: No edema.     Left lower leg: No edema.  Skin:    General: Skin is warm and dry.  Neurological:     Mental Status: She is alert.  Psychiatric:        Mood and Affect: Mood normal.     Labs reviewed: Basic Metabolic Panel: Recent Labs    06/03/21 1013 11/11/21 1056 11/22/21 0811  NA 137 135 133*  K 4.9 5.6* 5.2  CL 101 101 101  CO2 24 19* 19*  GLUCOSE 150* 258* 330*  BUN 27* 34* 45*  CREATININE 1.69* 2.40* 2.49*  CALCIUM 9.3 9.3 9.4  TSH  --  1.08  --    Liver Function Tests: Recent Labs    06/03/21 1013 11/11/21 1056  AST 13 10  ALT 8 9  BILITOT 0.3 0.3  PROT 6.6 6.7   No results for input(s): "LIPASE", "AMYLASE" in the last 8760 hours. No results for input(s): "AMMONIA" in  the last 8760 hours. CBC: Recent Labs    06/03/21 1013 06/21/21 0848 11/11/21 1056  WBC 11.5* 11.2* 11.3*  NEUTROABS 7,475 6,563 6,769  HGB 10.8* 10.7* 10.4*  HCT 32.7* 32.4* 31.7*  MCV 92.4 92.8 92.7  PLT 312 295 321   Lipid Panel: Recent Labs    06/03/21 1013 11/11/21 1056  CHOL 116 126  HDL 47* 50  LDLCALC 45 53  TRIG 163* 157*  CHOLHDL 2.5 2.5   TSH: Recent Labs    11/11/21 1056  TSH 1.08   A1C: Lab Results  Component Value Date   HGBA1C 10.1 (H) 11/11/2021     Assessment/Plan 1. Uncontrolled type 2 diabetes mellitus with hyperglycemia ([place]) -did not bring her meter in today but reports she is flowing blood sugars closely, has some low blood sugars in the morning but does not report symptoms of hypoglycemia as she had before -continue dietary modifications and eating routinely -she is taking levemir 21 units, will have her reduce to 20 units and follow.  - POC Glucose (CBG) - insulin detemir (LEVEMIR FLEXPEN) 100 UNIT/ML FlexPen; Inject 20 Units into the skin daily.Dispense: 15 mL; Refill: 1 - Insulin Pen Needle (B-D UF III MINI PEN NEEDLES) 31G X 5 MM MISC; Use to give insulin twice daily. Dx:E11.22  Dispense: 100 each; Refill: 3  2. CKD (chronic kidney disease) stage 4, GFR 15-29 ml/min (HCC) Follow by nephrology, reports he will follow up a1c with next blood draw (she is  too early today)  3. Orthostatic hypotension -improvement in symptoms wit proper hydration   4. Essential hypertension -Blood pressure well controlled Continue current medications Recheck metabolic panel  5. Overweight (BMI 25.0-29.9) -discussed weight, exercise and proper eating habits today.   Return in about 3 months (around 05/09/2022) for routine follow up. Carlos American. [place], [facility] [phone]

## 2022-02-08 ENCOUNTER — Telehealth: Payer: Self-pay | Admitting: Orthopaedic Surgery

## 2022-02-08 ENCOUNTER — Telehealth: Payer: Self-pay

## 2022-02-08 NOTE — Telephone Encounter (Signed)
Pt called and would like to submit bil gel injections.   Cb [phone]

## 2022-02-08 NOTE — Telephone Encounter (Signed)
Noted  

## 2022-02-08 NOTE — Telephone Encounter (Signed)
VOB submitted for Durolane, bilateral knee. BV pending. 

## 2022-02-13 ENCOUNTER — Other Ambulatory Visit: Payer: Self-pay | Admitting: *Deleted

## 2022-02-13 MED ORDER — ACCU-CHEK GUIDE VI STRP: ORAL_STRIP | 3 refills | Status: DC

## 2022-02-13 NOTE — Telephone Encounter (Signed)
[facility] requested refill.  ? ?

## 2022-02-23 ENCOUNTER — Ambulatory Visit (INDEPENDENT_AMBULATORY_CARE_PROVIDER_SITE_OTHER): Payer: Medicare Other | Admitting: Ophthalmology

## 2022-02-23 ENCOUNTER — Encounter (INDEPENDENT_AMBULATORY_CARE_PROVIDER_SITE_OTHER): Payer: Medicare Other | Admitting: Ophthalmology

## 2022-02-23 ENCOUNTER — Encounter (INDEPENDENT_AMBULATORY_CARE_PROVIDER_SITE_OTHER): Payer: Self-pay | Admitting: Ophthalmology

## 2022-02-23 DIAGNOSIS — E113553 Type 2 diabetes mellitus with stable proliferative diabetic retinopathy, bilateral: Secondary | ICD-10-CM

## 2022-02-23 DIAGNOSIS — Z961 Presence of intraocular lens: Secondary | ICD-10-CM | POA: Diagnosis not present

## 2022-02-23 NOTE — Assessment & Plan Note (Signed)
Looks great OU, stable overall

## 2022-02-23 NOTE — Assessment & Plan Note (Signed)
OU, stable condition.  No signs of active PDR in either eye.  Moderate scatter PRP notable in each eye.  No active maculopathy.

## 2022-02-23 NOTE — Progress Notes (Signed)
02/23/2022     CHIEF COMPLAINT Patient presents for  Chief Complaint  Patient presents with   Diabetic Retinopathy without Macular Edema      HISTORY OF PRESENT ILLNESS: Julie Baxter is a 86 y.o. female who presents to the clinic today for:   HPI   9 MOS FU OU OCT FP. Pt stated vision has been stable since last visit.  Bilateral history of PDR with CSME active.  Now for the last 9 months no change in vision.  Incidentally over this.  Patient is lost 30 to 40 pounds.  Certainly an improvement in her health is occurring    Last edited by Hurman Horn, MD on 02/23/2022  9:17 AM.      Referring physician: Sandrea Hughs, NP [place],  [place] [address]  HISTORICAL INFORMATION:   Selected notes from the MEDICAL RECORD NUMBER    Lab Results  Component Value Date   HGBA1C 10.1 (H) 11/11/2021     CURRENT MEDICATIONS: Current Outpatient Medications (Ophthalmic Drugs)  Medication Sig   Propylene Glycol (SYSTANE BALANCE OP) Apply 1 drop to eye daily as needed (dry eyes).   No current facility-administered medications for this visit. (Ophthalmic Drugs)   Current Outpatient Medications (Other)  Medication Sig   aspirin EC 81 MG tablet Take 81 mg by mouth daily.   Blood Glucose Monitoring Suppl (ACCU-CHEK GUIDE ME) w/Device KIT 1 kit by Does not apply route as directed. ACCU CHEK GUIDE ME TEST STRIPS WITH KIT 180 strips, with 4 refills.   diclofenac Sodium (VOLTAREN) 1 % GEL Apply 2 g topically 4 (four) times daily as needed.   glipiZIDE (GLUCOTROL) 5 MG tablet TAKE ONE TABLET BY MOUTH  TWICE DAILY BEFORE MEALS  (BREAKFAST AND LUNCH)   glucose blood (ACCU-CHEK GUIDE) test strip Use to test blood sugar twice daily. Dx: E11.22   insulin detemir (LEVEMIR FLEXPEN) 100 UNIT/ML FlexPen Inject 20 Units into the skin daily. 7 pm   Insulin Pen Needle (B-D UF III MINI PEN NEEDLES) 31G X 5 MM MISC Use to give insulin twice daily. Dx:E11.22   linagliptin (TRADJENTA)  5 MG TABS tablet Take 1 tablet (5 mg total) by mouth daily.   simvastatin (ZOCOR) 40 MG tablet TAKE 1 TABLET BY MOUTH  DAILY   sodium bicarbonate 650 MG tablet Take 1 tablet by mouth 3 (three) times daily.   No current facility-administered medications for this visit. (Other)      REVIEW OF SYSTEMS: ROS   Negative for: Constitutional, Gastrointestinal, Neurological, Skin, Genitourinary, Musculoskeletal, HENT, Endocrine, Cardiovascular, Eyes, Respiratory, Psychiatric, Allergic/Imm, Heme/Lymph Last edited by Silvestre Moment on 02/23/2022  8:44 AM.       ALLERGIES Allergies  Allergen Reactions   Elemental Sulfur Hives   Naproxen Swelling and Other (See Comments)    Blacked out - Face swelling    Tramadol Nausea And Vomiting    PAST MEDICAL HISTORY Past Medical History:  Diagnosis Date   Anxiety    regarding surgery; occassionally pt. states   Anxiety state, unspecified    Arthritis    Asymptomatic varicose veins    Broken arm    Chronic kidney disease    Diabetic retinopathy ([place])    Edema    Hard of hearing    Hyperpotassemia    Hypertension    Inflammation of joint of knee    right knee   Insomnia, unspecified    Multiple thyroid nodules    Osteoarthrosis, unspecified  whether generalized or localized, unspecified site    Other abnormal blood chemistry    Other and unspecified hyperlipidemia    Other malaise and fatigue    Other specified disease of nail    Overweight(278.02)    Pain in joint, lower leg    Pain in joint, pelvic region and thigh    Peripheral vascular disease ([place])    varicose veins-  "Blood Clot anterior left LEG WITH PREGNANCY"   PONV (postoperative nausea and vomiting)    pt. states that she was sick after hip surgery and oral surgery   Type II or unspecified type diabetes mellitus with renal manifestations, not stated as uncontrolled(250.40)    Undiagnosed cardiac murmurs    Unspecified disorder of kidney and ureter    Unspecified disorder of  kidney and ureter    Urinary tract infection, site not specified    Past Surgical History:  Procedure Laterality Date   ABDOMINAL HYSTERECTOMY     EYE SURGERY Bilateral    cataract extraction with IOL   LIPOMA EXCISION     x2  bilateral buttocks   spinal meningitis  1957   TONSILLECTOMY     TOTAL HIP ARTHROPLASTY Right 11/29/2012   Procedure: RIGHT TOTAL HIP ARTHROPLASTY ANTERIOR APPROACH;  Surgeon: Mcarthur Rossetti, MD;  Location: [facility];  Service: Orthopedics;  Laterality: Right;   TOTAL HIP ARTHROPLASTY Left 06/01/2015   Procedure: LEFT TOTAL HIP ARTHROPLASTY ANTERIOR APPROACH;  Surgeon: Mcarthur Rossetti, MD;  Location: [facility];  Service: Orthopedics;  Laterality: Left;    FAMILY HISTORY History reviewed. No pertinent family history.  SOCIAL HISTORY Social History   Tobacco Use   Smoking status: Never   Smokeless tobacco: Never  Vaping Use   Vaping Use: Never used  Substance Use Topics   Alcohol use: No   Drug use: No         OPHTHALMIC EXAM:  Base Eye Exam     Visual Acuity (ETDRS)       Right Left   Dist [place] 20/25 -1 +1 20/40 -2   Dist ph [place]  20/25 -1         Tonometry (Tonopen, 8:52 AM)       Right Left   Pressure 15 14         Pupils       Pupils APD   Right PERRL None   Left PERRL None         Visual Fields       Left Right    Full Full         Extraocular Movement       Right Left    Full Full         Neuro/Psych     Oriented x3: Yes   Mood/Affect: Normal         Dilation     Both eyes: 1.0% Mydriacyl, 2.5% Phenylephrine @ 8:52 AM           Slit Lamp and Fundus Exam     External Exam       Right Left   External Normal Normal         Slit Lamp Exam       Right Left   Lids/Lashes Normal Normal   Conjunctiva/Sclera White and quiet White and quiet   Cornea Clear Clear   Anterior Chamber Deep and quiet Deep and quiet   Iris Round and reactive Round and reactive   Lens Centered posterior  chamber intraocular lens Centered posterior chamber intraocular lens   Anterior Vitreous Normal Normal         Fundus Exam       Right Left   Posterior Vitreous Posterior vitreous detachment Posterior vitreous detachment   Disc Normal Normal   C/D Ratio 0.2 0.2   Macula Normal, good focal laser, no active CSME Good focal laser temporally   Vessels PDR-quiet PDR-quiet   Periphery Moderate scatter photocoagulation nasally, Good PRP Moderate scatter photocoagulation nasally, Good PRP clear media            IMAGING AND PROCEDURES  Imaging and Procedures for 02/23/22  OCT, Retina - OU - Both Eyes       Right Eye Quality was good. Scan locations included subfoveal. Central Foveal Thickness: 252. Progression has been stable. Findings include abnormal foveal contour.   Left Eye Quality was good. Scan locations included subfoveal. Central Foveal Thickness: 208. Progression has been stable. Findings include abnormal foveal contour.   Notes No active diabetic maculopathy.  Good laser photocoagulation scars temporally OU, stable     Color Fundus Photography Optos - OU - Both Eyes       Right Eye Progression has been stable. Macula : microaneurysms.   Left Eye Progression has been stable. Disc findings include normal observations. Macula : microaneurysms.   Notes Good focal laser scars temporally  and nasally left eye  Quiescent PDR, we was only early PDR in the past,, watch anteriorly OS  Good PRP nasally OU stable Clear media OU             ASSESSMENT/PLAN:  Controlled type 2 diabetes mellitus with stable proliferative retinopathy of both eyes, without long-term current use of insulin (HCC) OU, stable condition.  No signs of active PDR in either eye.  Moderate scatter PRP notable in each eye.  No active maculopathy.  Pseudophakia, both eyes Looks great OU, stable overall     ICD-10-CM   1. Controlled type 2 diabetes mellitus with stable proliferative  retinopathy of both eyes, without long-term current use of insulin (HCC)  E11.3553 OCT, Retina - OU - Both Eyes    Color Fundus Photography Optos - OU - Both Eyes    2. Pseudophakia, both eyes  Z96.1       1.  Bilateral quiescent PDR.  No active maculopathy.  Low risk for progression.  2.  No active disease OU.  Will observe  3.  Ophthalmic Meds Ordered this visit:  No orders of the defined types were placed in this encounter.      Return in about 9 months (around 11/24/2022) for DILATE OU, OCT, COLOR FP.  There are no Patient Instructions on file for this visit.   Explained the diagnoses, plan, and follow up with the patient and they expressed understanding.  Patient expressed understanding of the importance of proper follow up care.   Clent Demark Fabienne Nolasco M.D. Diseases & Surgery of the Retina and Vitreous [facility] [place] 02/23/22     Abbreviations: M myopia (nearsighted); A astigmatism; H hyperopia (farsighted); P presbyopia; Mrx spectacle prescription;  CTL contact lenses; OD right eye; OS left eye; OU both eyes  XT exotropia; ET esotropia; PEK punctate epithelial keratitis; PEE punctate epithelial erosions; DES dry eye syndrome; MGD meibomian gland dysfunction; ATs artificial tears; PFAT's preservative free artificial tears; [place] nuclear sclerotic cataract; PSC posterior subcapsular cataract; ERM epi-retinal membrane; PVD posterior vitreous detachment; RD retinal detachment; DM diabetes mellitus; DR diabetic retinopathy; NPDR  non-proliferative diabetic retinopathy; PDR proliferative diabetic retinopathy; CSME clinically significant macular edema; DME diabetic macular edema; dbh dot blot hemorrhages; CWS cotton wool spot; POAG primary open angle glaucoma; C/D cup-to-disc ratio; HVF humphrey visual field; GVF goldmann visual field; OCT optical coherence tomography; IOP intraocular pressure; BRVO Branch retinal vein occlusion; CRVO central retinal vein occlusion; CRAO central  retinal artery occlusion; BRAO branch retinal artery occlusion; RT retinal tear; SB scleral buckle; PPV pars plana vitrectomy; VH Vitreous hemorrhage; PRP panretinal laser photocoagulation; IVK intravitreal kenalog; VMT vitreomacular traction; MH Macular hole;  NVD neovascularization of the disc; NVE neovascularization elsewhere; AREDS age related eye disease study; ARMD age related macular degeneration; POAG primary open angle glaucoma; EBMD epithelial/anterior basement membrane dystrophy; ACIOL anterior chamber intraocular lens; IOL intraocular lens; PCIOL posterior chamber intraocular lens; Phaco/IOL phacoemulsification with intraocular lens placement; [place] photorefractive keratectomy; LASIK laser assisted in situ keratomileusis; HTN hypertension; DM diabetes mellitus; COPD chronic obstructive pulmonary disease

## 2022-03-06 ENCOUNTER — Ambulatory Visit: Payer: Medicare Other | Admitting: Physician Assistant

## 2022-03-06 ENCOUNTER — Encounter: Payer: Self-pay | Admitting: Physician Assistant

## 2022-03-06 ENCOUNTER — Other Ambulatory Visit: Payer: Self-pay

## 2022-03-06 DIAGNOSIS — M1711 Unilateral primary osteoarthritis, right knee: Secondary | ICD-10-CM

## 2022-03-06 DIAGNOSIS — M1712 Unilateral primary osteoarthritis, left knee: Secondary | ICD-10-CM

## 2022-03-06 MED ORDER — SODIUM HYALURONATE 60 MG/3ML IX PRSY
60.0000 mg | PREFILLED_SYRINGE | INTRA_ARTICULAR | Status: AC | PRN
  Administered 2022-03-06: 60 mg via INTRA_ARTICULAR

## 2022-03-06 NOTE — Progress Notes (Signed)
   Procedure Note  Patient: Julie Baxter             Date of Birth: 1936/08/28           MRN: [id number]             Visit Date: 03/06/2022 HPI: Julie Baxter comes in today for right knee pain.  States the right knee started bothering her 2 months ago.  We last saw her for the knee arthritis in both knees on 01/31/2021 and she was given supplemental injections at that time.  She states that both knees did well until 2 months ago and right knee started bothering her.  She only wants the right knee injected today.  She has had no new injury to the right knee.  Physical exam: Right knee no effusion abnormal warmth erythema.  Good range of motion significant patellofemoral crepitus with passive range of motion.  Procedures: Visit Diagnoses:  1. Primary osteoarthritis of right knee     Large Joint Inj: R knee on 03/06/2022 8:47 AM Indications: pain Details: 22 G 1.5 in needle, anterolateral approach  Arthrogram: No  Medications: 60 mg Sodium Hyaluronate 60 MG/3ML Outcome: tolerated well, no immediate complications Procedure, treatment alternatives, risks and benefits explained, specific risks discussed. Consent was given by the patient. Immediately prior to procedure a time out was called to verify the correct patient, procedure, equipment, support staff and site/side marked as required. Patient was prepped and draped in the usual sterile fashion.     Plan: She understands she needs to wait least 6 months between supplemental injections.  She will follow-up with [place] on an as-needed basis.  Questions were encouraged and answered

## 2022-03-15 DIAGNOSIS — D472 Monoclonal gammopathy: Secondary | ICD-10-CM | POA: Diagnosis not present

## 2022-03-15 DIAGNOSIS — E1122 Type 2 diabetes mellitus with diabetic chronic kidney disease: Secondary | ICD-10-CM | POA: Diagnosis not present

## 2022-03-15 DIAGNOSIS — E1129 Type 2 diabetes mellitus with other diabetic kidney complication: Secondary | ICD-10-CM | POA: Diagnosis not present

## 2022-03-15 DIAGNOSIS — R809 Proteinuria, unspecified: Secondary | ICD-10-CM | POA: Diagnosis not present

## 2022-03-15 DIAGNOSIS — N189 Chronic kidney disease, unspecified: Secondary | ICD-10-CM | POA: Diagnosis not present

## 2022-03-26 ENCOUNTER — Other Ambulatory Visit: Payer: Self-pay | Admitting: Nurse Practitioner

## 2022-03-29 DIAGNOSIS — D631 Anemia in chronic kidney disease: Secondary | ICD-10-CM | POA: Diagnosis not present

## 2022-03-29 DIAGNOSIS — D472 Monoclonal gammopathy: Secondary | ICD-10-CM | POA: Diagnosis not present

## 2022-03-29 DIAGNOSIS — N189 Chronic kidney disease, unspecified: Secondary | ICD-10-CM | POA: Diagnosis not present

## 2022-03-29 DIAGNOSIS — E211 Secondary hyperparathyroidism, not elsewhere classified: Secondary | ICD-10-CM | POA: Diagnosis not present

## 2022-03-29 DIAGNOSIS — E1122 Type 2 diabetes mellitus with diabetic chronic kidney disease: Secondary | ICD-10-CM | POA: Diagnosis not present

## 2022-03-29 DIAGNOSIS — R809 Proteinuria, unspecified: Secondary | ICD-10-CM | POA: Diagnosis not present

## 2022-03-29 DIAGNOSIS — N19 Unspecified kidney failure: Secondary | ICD-10-CM | POA: Diagnosis not present

## 2022-03-29 DIAGNOSIS — N170 Acute kidney failure with tubular necrosis: Secondary | ICD-10-CM | POA: Diagnosis not present

## 2022-03-29 DIAGNOSIS — E1129 Type 2 diabetes mellitus with other diabetic kidney complication: Secondary | ICD-10-CM | POA: Diagnosis not present

## 2022-04-06 ENCOUNTER — Other Ambulatory Visit: Payer: Self-pay | Admitting: Nurse Practitioner

## 2022-05-09 ENCOUNTER — Ambulatory Visit: Payer: Medicare Other | Admitting: Nurse Practitioner

## 2022-05-11 ENCOUNTER — Encounter: Payer: Self-pay | Admitting: Nurse Practitioner

## 2022-05-12 ENCOUNTER — Ambulatory Visit (INDEPENDENT_AMBULATORY_CARE_PROVIDER_SITE_OTHER): Payer: Medicare Other | Admitting: Nurse Practitioner

## 2022-05-12 ENCOUNTER — Encounter: Payer: Self-pay | Admitting: Nurse Practitioner

## 2022-05-12 VITALS — BP 130/74 | HR 59 | Ht 64.0 in | Wt 157.2 lb

## 2022-05-12 DIAGNOSIS — M545 Low back pain, unspecified: Secondary | ICD-10-CM | POA: Diagnosis not present

## 2022-05-12 DIAGNOSIS — E782 Mixed hyperlipidemia: Secondary | ICD-10-CM

## 2022-05-12 DIAGNOSIS — N184 Chronic kidney disease, stage 4 (severe): Secondary | ICD-10-CM | POA: Diagnosis not present

## 2022-05-12 DIAGNOSIS — I10 Essential (primary) hypertension: Secondary | ICD-10-CM

## 2022-05-12 DIAGNOSIS — Z23 Encounter for immunization: Secondary | ICD-10-CM

## 2022-05-12 DIAGNOSIS — E113553 Type 2 diabetes mellitus with stable proliferative diabetic retinopathy, bilateral: Secondary | ICD-10-CM

## 2022-05-12 NOTE — Patient Instructions (Signed)
Tylenol 325 mg 2 tablets every 6 hours for pain   Please sign a record release for nephrology on check out. Need all recent labs.

## 2022-05-12 NOTE — Progress Notes (Signed)
Careteam: Patient Care Team: Lauree Chandler, NP as PCP - General (Geriatric Medicine) Zadie Rhine Clent Demark, MD as Consulting Physician (Ophthalmology)  PLACE OF SERVICE:  [place] Directive information Does Patient Have a Medical Advance Directive?: Yes, Type of Advance Directive: Out of facility DNR (pink MOST or yellow form), Pre-existing out of facility DNR order (yellow form or pink MOST form): Yellow form placed in chart (order not valid for inpatient use), Does patient want to make changes to medical advance directive?: No - Patient declined  Allergies  Allergen Reactions   Elemental Sulfur Hives   Naproxen Swelling and Other (See Comments)    Blacked out - Face swelling    Tramadol Nausea And Vomiting    Chief Complaint  Patient presents with   Medical Management of Chronic Issues    3 month follow-up. Discuss need for shingrix, eye exam, and covid booster. Patient declined Flu vaccine today. Patient fell Aug. 28,2023 in house on the couch, jammed back.     HPI: Patient is a 86 y.o. female for routine follow up.  She fell last month, busted her mouth and hurt back.  Back still bothers her some and using cane for support but improving daily -using tylenol and heating pad with good effects  Reports her last A1c was 8.8 in august. She has been following with nephrology with frequent lab work. Blood sugars ranging form 65-112 fasting.  She is taking levemir 10 units, tradjenta and glipizide. No hypoglycemic episodes She will hold glipizide if she is not feeling well.   She reports she has been doing well with her renal diet.    Review of Systems:  Review of Systems  Constitutional:  Negative for chills, fever and weight loss.  HENT:  Negative for tinnitus.   Respiratory:  Negative for cough, sputum production and shortness of breath.   Cardiovascular:  Negative for chest pain, palpitations and leg swelling.  Gastrointestinal:  Negative for abdominal  pain, constipation, diarrhea and heartburn.  Genitourinary:  Negative for dysuria, frequency and urgency.  Musculoskeletal:  Positive for back pain. Negative for falls, joint pain and myalgias.  Skin: Negative.   Neurological:  Negative for dizziness and headaches.  Psychiatric/Behavioral:  Negative for depression and memory loss. The patient does not have insomnia.     Past Medical History:  Diagnosis Date   Anxiety    regarding surgery; occassionally pt. states   Anxiety state, unspecified    Arthritis    Asymptomatic varicose veins    Broken arm    Chronic kidney disease    Diabetic retinopathy ([place])    Edema    Hard of hearing    Hyperpotassemia    Hypertension    Inflammation of joint of knee    right knee   Insomnia, unspecified    Multiple thyroid nodules    Osteoarthrosis, unspecified whether generalized or localized, unspecified site    Other abnormal blood chemistry    Other and unspecified hyperlipidemia    Other malaise and fatigue    Other specified disease of nail    Overweight(278.02)    Pain in joint, lower leg    Pain in joint, pelvic region and thigh    Peripheral vascular disease ([place])    varicose veins-  "Blood Clot anterior left LEG WITH PREGNANCY"   PONV (postoperative nausea and vomiting)    pt. states that she was sick after hip surgery and oral surgery   Type II or unspecified type  diabetes mellitus with renal manifestations, not stated as uncontrolled(250.40)    Undiagnosed cardiac murmurs    Unspecified disorder of kidney and ureter    Unspecified disorder of kidney and ureter    Urinary tract infection, site not specified    Past Surgical History:  Procedure Laterality Date   ABDOMINAL HYSTERECTOMY     EYE SURGERY Bilateral    cataract extraction with IOL   LIPOMA EXCISION     x2  bilateral buttocks   spinal meningitis  1957   TONSILLECTOMY     TOTAL HIP ARTHROPLASTY Right 11/29/2012   Procedure: RIGHT TOTAL HIP ARTHROPLASTY ANTERIOR  APPROACH;  Surgeon: Mcarthur Rossetti, MD;  Location: [facility];  Service: Orthopedics;  Laterality: Right;   TOTAL HIP ARTHROPLASTY Left 06/01/2015   Procedure: LEFT TOTAL HIP ARTHROPLASTY ANTERIOR APPROACH;  Surgeon: Mcarthur Rossetti, MD;  Location: [facility];  Service: Orthopedics;  Laterality: Left;   Social History:   reports that she has never smoked. She has never used smokeless tobacco. She reports that she does not drink alcohol and does not use drugs.  History reviewed. No pertinent family history.  Medications: Patient's Medications  New Prescriptions   No medications on file  Previous Medications   ASPIRIN EC 81 MG TABLET    Take 81 mg by mouth daily.   BLOOD GLUCOSE MONITORING SUPPL (ACCU-CHEK GUIDE ME) W/DEVICE KIT    1 kit by Does not apply route as directed. ACCU CHEK GUIDE ME TEST STRIPS WITH KIT 180 strips, with 4 refills.   DICLOFENAC SODIUM (VOLTAREN) 1 % GEL    Apply 2 g topically 4 (four) times daily as needed.   GLIPIZIDE (GLUCOTROL) 5 MG TABLET    TAKE 1 TABLET BY MOUTH TWICE  DAILY BEFORE MEALS (BREAKFAST  AND LUNCH)   GLUCOSE BLOOD (ACCU-CHEK GUIDE) TEST STRIP    Use to test blood sugar twice daily. Dx: E11.22   INSULIN DETEMIR (LEVEMIR FLEXPEN) 100 UNIT/ML FLEXPEN    Inject 20 Units into the skin daily. 7 pm   INSULIN PEN NEEDLE (B-D UF III MINI PEN NEEDLES) 31G X 5 MM MISC    Use to give insulin twice daily. Dx:E11.22   LINAGLIPTIN (TRADJENTA) 5 MG TABS TABLET    Take 1 tablet (5 mg total) by mouth daily.   PROPYLENE GLYCOL (SYSTANE BALANCE OP)    Apply 1 drop to eye daily as needed (dry eyes).   SIMVASTATIN (ZOCOR) 40 MG TABLET    TAKE 1 TABLET BY MOUTH DAILY   SODIUM BICARBONATE 650 MG TABLET    Take 1 tablet by mouth 3 (three) times daily.  Modified Medications   No medications on file  Discontinued Medications   No medications on file    Physical Exam:  Vitals:   05/12/22 0825  BP: 130/74  Pulse: (!) 59  SpO2: 96%  Weight: 157 lb 3.2 oz (71.3  kg)  Height: _0  (1.626 m)   Body mass index is 26.98 kg/m. Wt Readings from Last 3 Encounters:  05/12/22 157 lb 3.2 oz (71.3 kg)  02/06/22 169 lb 9.6 oz (76.9 kg)  12/23/21 163 lb (73.9 kg)    Physical Exam Constitutional:      General: She is not in acute distress.    Appearance: She is well-developed. She is not diaphoretic.  HENT:     Head: Normocephalic and atraumatic.     Mouth/Throat:     Pharynx: No oropharyngeal exudate.  Eyes:     Conjunctiva/sclera:  Conjunctivae normal.     Pupils: Pupils are equal, round, and reactive to light.  Cardiovascular:     Rate and Rhythm: Normal rate and regular rhythm.     Heart sounds: Normal heart sounds.  Pulmonary:     Effort: Pulmonary effort is normal.     Breath sounds: Normal breath sounds.  Abdominal:     General: Bowel sounds are normal.     Palpations: Abdomen is soft.  Musculoskeletal:     Cervical back: Normal range of motion and neck supple.     Right lower leg: No edema.     Left lower leg: No edema.  Skin:    General: Skin is warm and dry.  Neurological:     Mental Status: She is alert.     Gait: Gait abnormal (using cane, walking slow with help of husband).  Psychiatric:        Mood and Affect: Mood normal.     Labs reviewed: Basic Metabolic Panel: Recent Labs    06/03/21 1013 11/11/21 1056 11/22/21 0811  NA 137 135 133*  K 4.9 5.6* 5.2  CL 101 101 101  CO2 24 19* 19*  GLUCOSE 150* 258* 330*  BUN 27* 34* 45*  CREATININE 1.69* 2.40* 2.49*  CALCIUM 9.3 9.3 9.4  TSH  --  1.08  --    Liver Function Tests: Recent Labs    06/03/21 1013 11/11/21 1056  AST 13 10  ALT 8 9  BILITOT 0.3 0.3  PROT 6.6 6.7   No results for input(s): "LIPASE", "AMYLASE" in the last 8760 hours. No results for input(s): "AMMONIA" in the last 8760 hours. CBC: Recent Labs    06/03/21 1013 06/21/21 0848 11/11/21 1056  WBC 11.5* 11.2* 11.3*  NEUTROABS 7,475 6,563 6,769  HGB 10.8* 10.7* 10.4*  HCT 32.7* 32.4*  31.7*  MCV 92.4 92.8 92.7  PLT 312 295 321   Lipid Panel: Recent Labs    06/03/21 1013 11/11/21 1056  CHOL 116 126  HDL 47* 50  LDLCALC 45 53  TRIG 163* 157*  CHOLHDL 2.5 2.5   TSH: Recent Labs    11/11/21 1056  TSH 1.08   A1C: Lab Results  Component Value Date   HGBA1C 10.1 (H) 11/11/2021     Assessment/Plan 1. Controlled type 2 diabetes mellitus with stable proliferative retinopathy of both eyes, without long-term current use of insulin (HCC) -will get latest A1c from nephrologist.  Encouraged dietary compliance, routine foot care/monitoring and to keep up with diabetic eye exams through ophthalmology  -discussed stopping glipizide if fasting blood sugars continue below 100.   2. CKD (chronic kidney disease) stage 4, GFR 15-29 ml/min (HCC) -Chronic and stable -sodium bicarb added by nephrology, continues to follow up with nephrology Encourage proper hydration Follow metabolic panel Avoid nephrotoxic meds (NSAIDS)  3. Essential hypertension -Blood pressure well controlled, goal bp <140/90 Continue current medications and dietary modifications follow metabolic panel  5. Mixed hyperlipidemia -continues on zocor with dietary modifications.   6. Acute bilateral low back pain without sciatica -after fall,improving daily.  She did clean her house and reports she exacerbated symptoms. Discussed not to over work back and can do exercises to help.  -continue heating pad, tylenol PRN    Return in about 3 months (around 08/11/2022) for routine follow up. Carlos American. [facility], [facility] [phone]

## 2022-05-26 ENCOUNTER — Other Ambulatory Visit: Payer: Self-pay | Admitting: Family

## 2022-05-26 ENCOUNTER — Emergency Department (HOSPITAL_COMMUNITY): Payer: Medicare Other

## 2022-05-26 ENCOUNTER — Telehealth: Payer: Self-pay | Admitting: *Deleted

## 2022-05-26 ENCOUNTER — Other Ambulatory Visit: Payer: Self-pay

## 2022-05-26 ENCOUNTER — Emergency Department (HOSPITAL_COMMUNITY)
Admission: EM | Admit: 2022-05-26 | Discharge: 2022-05-27 | Disposition: A | Discharge status: Other Acute Inpatient Hospital | Payer: Medicare Other | Attending: Emergency Medicine | Admitting: Emergency Medicine

## 2022-05-26 ENCOUNTER — Encounter (HOSPITAL_COMMUNITY): Payer: Self-pay | Admitting: Emergency Medicine

## 2022-05-26 ENCOUNTER — Other Ambulatory Visit: Payer: Medicare Other

## 2022-05-26 DIAGNOSIS — N189 Chronic kidney disease, unspecified: Secondary | ICD-10-CM | POA: Insufficient documentation

## 2022-05-26 DIAGNOSIS — R945 Abnormal results of liver function studies: Secondary | ICD-10-CM | POA: Insufficient documentation

## 2022-05-26 DIAGNOSIS — E113553 Type 2 diabetes mellitus with stable proliferative diabetic retinopathy, bilateral: Secondary | ICD-10-CM | POA: Diagnosis not present

## 2022-05-26 DIAGNOSIS — K8309 Other cholangitis: Secondary | ICD-10-CM | POA: Diagnosis not present

## 2022-05-26 DIAGNOSIS — J9811 Atelectasis: Secondary | ICD-10-CM | POA: Diagnosis not present

## 2022-05-26 DIAGNOSIS — Z794 Long term (current) use of insulin: Secondary | ICD-10-CM | POA: Diagnosis not present

## 2022-05-26 DIAGNOSIS — I129 Hypertensive chronic kidney disease with stage 1 through stage 4 chronic kidney disease, or unspecified chronic kidney disease: Secondary | ICD-10-CM | POA: Insufficient documentation

## 2022-05-26 DIAGNOSIS — N184 Chronic kidney disease, stage 4 (severe): Secondary | ICD-10-CM | POA: Diagnosis not present

## 2022-05-26 DIAGNOSIS — I700 Atherosclerosis of aorta: Secondary | ICD-10-CM | POA: Diagnosis not present

## 2022-05-26 DIAGNOSIS — K838 Other specified diseases of biliary tract: Secondary | ICD-10-CM | POA: Diagnosis not present

## 2022-05-26 DIAGNOSIS — E1122 Type 2 diabetes mellitus with diabetic chronic kidney disease: Secondary | ICD-10-CM | POA: Insufficient documentation

## 2022-05-26 DIAGNOSIS — R1011 Right upper quadrant pain: Secondary | ICD-10-CM | POA: Diagnosis not present

## 2022-05-26 DIAGNOSIS — E782 Mixed hyperlipidemia: Secondary | ICD-10-CM | POA: Diagnosis not present

## 2022-05-26 DIAGNOSIS — Z79899 Other long term (current) drug therapy: Secondary | ICD-10-CM | POA: Insufficient documentation

## 2022-05-26 DIAGNOSIS — C259 Malignant neoplasm of pancreas, unspecified: Secondary | ICD-10-CM | POA: Diagnosis not present

## 2022-05-26 DIAGNOSIS — I10 Essential (primary) hypertension: Secondary | ICD-10-CM | POA: Diagnosis not present

## 2022-05-26 DIAGNOSIS — R109 Unspecified abdominal pain: Secondary | ICD-10-CM | POA: Diagnosis not present

## 2022-05-26 DIAGNOSIS — D490 Neoplasm of unspecified behavior of digestive system: Secondary | ICD-10-CM

## 2022-05-26 DIAGNOSIS — R17 Unspecified jaundice: Secondary | ICD-10-CM | POA: Diagnosis not present

## 2022-05-26 DIAGNOSIS — K828 Other specified diseases of gallbladder: Secondary | ICD-10-CM | POA: Diagnosis not present

## 2022-05-26 DIAGNOSIS — K822 Perforation of gallbladder: Secondary | ICD-10-CM | POA: Diagnosis not present

## 2022-05-26 DIAGNOSIS — Z7982 Long term (current) use of aspirin: Secondary | ICD-10-CM | POA: Diagnosis not present

## 2022-05-26 DIAGNOSIS — R7989 Other specified abnormal findings of blood chemistry: Secondary | ICD-10-CM

## 2022-05-26 LAB — COMPREHENSIVE METABOLIC PANEL
ALT: 429 U/L — ABNORMAL HIGH (ref 0–44)
AST: 1013 U/L — ABNORMAL HIGH (ref 15–41)
Albumin: 1.9 g/dL — ABNORMAL LOW (ref 3.5–5.0)
Alkaline Phosphatase: 1616 U/L — ABNORMAL HIGH (ref 38–126)
Anion gap: 15 (ref 5–15)
BUN: 36 mg/dL — ABNORMAL HIGH (ref 8–23)
CO2: 19 mmol/L — ABNORMAL LOW (ref 22–32)
Calcium: 7.9 mg/dL — ABNORMAL LOW (ref 8.9–10.3)
Chloride: 93 mmol/L — ABNORMAL LOW (ref 98–111)
Creatinine, Ser: 2.36 mg/dL — ABNORMAL HIGH (ref 0.44–1.00)
GFR, Estimated: 20 mL/min — ABNORMAL LOW (ref >60)
Glucose, Bld: 104 mg/dL — ABNORMAL HIGH (ref 70–99)
Potassium: 3.5 mmol/L (ref 3.5–5.1)
Sodium: 127 mmol/L — ABNORMAL LOW (ref 135–145)
Total Bilirubin: 11.5 mg/dL — ABNORMAL HIGH (ref 0.3–1.2)
Total Protein: 6.9 g/dL (ref 6.5–8.1)

## 2022-05-26 LAB — CBC WITH DIFFERENTIAL/PLATELET
Abs Immature Granulocytes: 1.67 10*3/uL — ABNORMAL HIGH (ref 0.00–0.07)
Basophils Absolute: 0.1 10*3/uL (ref 0.0–0.1)
Basophils Relative: 0 %
Eosinophils Absolute: 0 10*3/uL (ref 0.0–0.5)
Eosinophils Relative: 0 %
HCT: 28.8 % — ABNORMAL LOW (ref 36.0–46.0)
Hemoglobin: 9.2 g/dL — ABNORMAL LOW (ref 12.0–15.0)
Immature Granulocytes: 5 %
Lymphocytes Relative: 4 %
Lymphs Abs: 1.3 10*3/uL (ref 0.7–4.0)
MCH: 30.3 pg (ref 26.0–34.0)
MCHC: 31.9 g/dL (ref 30.0–36.0)
MCV: 94.7 fL (ref 80.0–100.0)
Monocytes Absolute: 2.7 10*3/uL — ABNORMAL HIGH (ref 0.1–1.0)
Monocytes Relative: 9 %
Neutro Abs: 26.3 10*3/uL — ABNORMAL HIGH (ref 1.7–7.7)
Neutrophils Relative %: 82 %
Platelets: 561 10*3/uL — ABNORMAL HIGH (ref 150–400)
RBC: 3.04 MIL/uL — ABNORMAL LOW (ref 3.87–5.11)
RDW: 16.1 % — ABNORMAL HIGH (ref 11.5–15.5)
WBC: 32.1 10*3/uL — ABNORMAL HIGH (ref 4.0–10.5)
nRBC: 0 % (ref 0.0–0.2)

## 2022-05-26 LAB — PROTIME-INR
INR: 1.2 (ref 0.8–1.2)
Prothrombin Time: 15.1 seconds (ref 11.4–15.2)

## 2022-05-26 LAB — BILIRUBIN, DIRECT: Bilirubin, Direct: 7.3 mg/dL — ABNORMAL HIGH (ref 0.0–0.2)

## 2022-05-26 LAB — LIPASE, BLOOD: Lipase: 32 U/L (ref 11–51)

## 2022-05-26 LAB — LACTIC ACID, PLASMA: Lactic Acid, Venous: 1.9 mmol/L (ref 0.5–1.9)

## 2022-05-26 MED ORDER — PIPERACILLIN-TAZOBACTAM 3.375 G IVPB: 3.3750 g | Freq: Four times a day (QID) | INTRAVENOUS | Status: DC

## 2022-05-26 MED ORDER — PIPERACILLIN-TAZOBACTAM 3.375 G IVPB
3.3750 g | INTRAVENOUS | Status: AC
  Administered 2022-05-26: 3.375 g via INTRAVENOUS
  Filled 2022-05-26: qty 50

## 2022-05-26 NOTE — ED Provider Notes (Signed)
[facility] EMERGENCY DEPARTMENT Provider Note   CSN: [id number] Arrival date & time: 05/26/22  1751     History {Add pertinent medical, surgical, social history, OB history to HPI:1} Chief Complaint  Patient presents with   Abnormal Lab    Julie Baxter is a 86 y.o. female.  HPI     Home Medications Prior to Admission medications   Medication Sig Start Date End Date Taking? Authorizing Provider  aspirin EC 81 MG tablet Take 81 mg by mouth daily.    [provider]  Blood Glucose Monitoring Suppl (ACCU-CHEK GUIDE ME) w/Device KIT 1 kit by Does not apply route as directed. ACCU CHEK GUIDE ME TEST STRIPS WITH KIT 180 strips, with 4 refills. 01/25/22   Lauree Chandler, NP  diclofenac Sodium (VOLTAREN) 1 % GEL Apply 2 g topically 4 (four) times daily as needed. 02/14/21   Mcarthur Rossetti, MD  glipiZIDE (GLUCOTROL) 5 MG tablet TAKE 1 TABLET BY MOUTH TWICE  DAILY BEFORE MEALS (BREAKFAST  AND LUNCH) 03/27/22   Lauree Chandler, NP  glucose blood (ACCU-CHEK GUIDE) test strip Use to test blood sugar twice daily. Dx: E11.22 02/13/22   Lauree Chandler, NP  insulin detemir (LEVEMIR FLEXPEN) 100 UNIT/ML FlexPen Inject 10 Units into the skin daily.    [provider]  Insulin Pen Needle (B-D UF III MINI PEN NEEDLES) 31G X 5 MM MISC Use to give insulin twice daily. Dx:E11.22 02/06/22   Lauree Chandler, NP  linagliptin (TRADJENTA) 5 MG TABS tablet Take 1 tablet (5 mg total) by mouth daily. 12/08/21   Lauree Chandler, NP  Propylene Glycol (SYSTANE BALANCE OP) Apply 1 drop to eye daily as needed (dry eyes).    [provider]  simvastatin (ZOCOR) 40 MG tablet TAKE 1 TABLET BY MOUTH DAILY 04/07/22   Lauree Chandler, NP  sodium bicarbonate 650 MG tablet Take 1 tablet by mouth 3 (three) times daily. 02/01/22   [provider]  insulin detemir (LEVEMIR FLEXPEN) 100 UNIT/ML FlexPen Inject 20 Units into the skin daily.    [provider]       Allergies    Elemental sulfur, Naproxen, and Tramadol    Review of Systems   Review of Systems  Physical Exam Updated Vital Signs BP 138/71 (BP Location: Left Arm)   Pulse (!) 103   Temp 98 F (36.7 C) (Oral)   Resp 16   Ht _0  (1.702 m)   Wt 69.9 kg   SpO2 99%   BMI 24.12 kg/m  Physical Exam  ED Results / Procedures / Treatments   Labs (all labs ordered are listed, but only abnormal results are displayed) Labs Reviewed - No data to display  EKG None  Radiology No results found.  Procedures Procedures  {Document cardiac monitor, telemetry assessment procedure when appropriate:1}  Medications Ordered in ED Medications - No data to display  ED Course/ Medical Decision Making/ A&P                           Medical Decision Making  ***  {Document critical care time when appropriate:1} {Document review of labs and clinical decision tools ie heart score, Chads2Vasc2 etc:1}  {Document your independent review of radiology images, and any outside records:1} {Document your discussion with family members, caretakers, and with consultants:1} {Document social determinants of health affecting pt's care:1} {Document your decision making why or why not admission, treatments were  needed:1} Final Clinical Impression(s) / ED Diagnoses Final diagnoses:  None    Rx / DC Orders ED Discharge Orders     None

## 2022-05-26 NOTE — Progress Notes (Signed)
I was consulted for admission for this patient to [facility]. Our gen surg felt that she would need to be admitted there for percutaneous drain placement and MRCP. I personally spoke with Dr. Rosendo Gros - surgeon on call - who recommended that this patient go to a tertiary care hospital. I have let Dr. Philip Aspen know. Admission deferred at this time.

## 2022-05-26 NOTE — Telephone Encounter (Signed)
Patient called about her labs that were done just this morning (STAT). Made patient aware that not all the lab work is in yet. Advised we would call once the provider signs off on it once all labs are in. Routed message to [place] asking her per patient to be notified as soon as the results are in and to also send any scripts to her pharmacy she might need.  Waiting for all labs to be processed. Patient asked when I called her back would they be done today. I told her, I wasn't sure some test take longer to process.

## 2022-05-26 NOTE — ED Triage Notes (Signed)
Pt sent by PCP following blood work, sent due to hgb, liver enzymes, wbc

## 2022-05-26 NOTE — ED Notes (Signed)
Demographics faxed to [facility].

## 2022-05-27 DIAGNOSIS — K8309 Other cholangitis: Secondary | ICD-10-CM | POA: Diagnosis not present

## 2022-05-27 DIAGNOSIS — Z20822 Contact with and (suspected) exposure to covid-19: Secondary | ICD-10-CM | POA: Diagnosis not present

## 2022-05-27 DIAGNOSIS — R8569 Abnormal cytological findings in specimens from other digestive organs and abdominal cavity: Secondary | ICD-10-CM | POA: Diagnosis not present

## 2022-05-27 DIAGNOSIS — N179 Acute kidney failure, unspecified: Secondary | ICD-10-CM | POA: Diagnosis not present

## 2022-05-27 DIAGNOSIS — R1011 Right upper quadrant pain: Secondary | ICD-10-CM | POA: Diagnosis not present

## 2022-05-27 DIAGNOSIS — N189 Chronic kidney disease, unspecified: Secondary | ICD-10-CM | POA: Diagnosis not present

## 2022-05-27 DIAGNOSIS — K8689 Other specified diseases of pancreas: Secondary | ICD-10-CM | POA: Diagnosis not present

## 2022-05-27 DIAGNOSIS — R945 Abnormal results of liver function studies: Secondary | ICD-10-CM | POA: Diagnosis not present

## 2022-05-27 DIAGNOSIS — E8809 Other disorders of plasma-protein metabolism, not elsewhere classified: Secondary | ICD-10-CM | POA: Diagnosis not present

## 2022-05-27 DIAGNOSIS — S36119A Unspecified injury of liver, initial encounter: Secondary | ICD-10-CM | POA: Diagnosis not present

## 2022-05-27 DIAGNOSIS — Z886 Allergy status to analgesic agent status: Secondary | ICD-10-CM | POA: Diagnosis not present

## 2022-05-27 DIAGNOSIS — K810 Acute cholecystitis: Secondary | ICD-10-CM | POA: Diagnosis not present

## 2022-05-27 DIAGNOSIS — K82A2 Perforation of gallbladder in cholecystitis: Secondary | ICD-10-CM | POA: Diagnosis not present

## 2022-05-27 DIAGNOSIS — Z794 Long term (current) use of insulin: Secondary | ICD-10-CM | POA: Diagnosis not present

## 2022-05-27 DIAGNOSIS — Z4659 Encounter for fitting and adjustment of other gastrointestinal appliance and device: Secondary | ICD-10-CM | POA: Diagnosis not present

## 2022-05-27 DIAGNOSIS — Z7982 Long term (current) use of aspirin: Secondary | ICD-10-CM | POA: Diagnosis not present

## 2022-05-27 DIAGNOSIS — D631 Anemia in chronic kidney disease: Secondary | ICD-10-CM | POA: Diagnosis not present

## 2022-05-27 DIAGNOSIS — K838 Other specified diseases of biliary tract: Secondary | ICD-10-CM | POA: Diagnosis not present

## 2022-05-27 DIAGNOSIS — J9811 Atelectasis: Secondary | ICD-10-CM | POA: Diagnosis not present

## 2022-05-27 DIAGNOSIS — E871 Hypo-osmolality and hyponatremia: Secondary | ICD-10-CM | POA: Diagnosis not present

## 2022-05-27 DIAGNOSIS — K8020 Calculus of gallbladder without cholecystitis without obstruction: Secondary | ICD-10-CM | POA: Diagnosis not present

## 2022-05-27 DIAGNOSIS — K822 Perforation of gallbladder: Secondary | ICD-10-CM | POA: Diagnosis not present

## 2022-05-27 DIAGNOSIS — K828 Other specified diseases of gallbladder: Secondary | ICD-10-CM | POA: Diagnosis not present

## 2022-05-27 DIAGNOSIS — R17 Unspecified jaundice: Secondary | ICD-10-CM | POA: Diagnosis not present

## 2022-05-27 DIAGNOSIS — I129 Hypertensive chronic kidney disease with stage 1 through stage 4 chronic kidney disease, or unspecified chronic kidney disease: Secondary | ICD-10-CM | POA: Diagnosis not present

## 2022-05-27 DIAGNOSIS — A419 Sepsis, unspecified organism: Secondary | ICD-10-CM | POA: Diagnosis not present

## 2022-05-27 DIAGNOSIS — C259 Malignant neoplasm of pancreas, unspecified: Secondary | ICD-10-CM | POA: Diagnosis not present

## 2022-05-27 DIAGNOSIS — Z885 Allergy status to narcotic agent status: Secondary | ICD-10-CM | POA: Diagnosis not present

## 2022-05-27 DIAGNOSIS — I700 Atherosclerosis of aorta: Secondary | ICD-10-CM | POA: Diagnosis not present

## 2022-05-27 DIAGNOSIS — Z66 Do not resuscitate: Secondary | ICD-10-CM | POA: Diagnosis not present

## 2022-05-27 DIAGNOSIS — Z882 Allergy status to sulfonamides status: Secondary | ICD-10-CM | POA: Diagnosis not present

## 2022-05-27 DIAGNOSIS — R1312 Dysphagia, oropharyngeal phase: Secondary | ICD-10-CM | POA: Diagnosis not present

## 2022-05-27 DIAGNOSIS — E876 Hypokalemia: Secondary | ICD-10-CM | POA: Diagnosis not present

## 2022-05-27 DIAGNOSIS — R748 Abnormal levels of other serum enzymes: Secondary | ICD-10-CM | POA: Diagnosis not present

## 2022-05-27 DIAGNOSIS — E1122 Type 2 diabetes mellitus with diabetic chronic kidney disease: Secondary | ICD-10-CM | POA: Diagnosis not present

## 2022-05-27 DIAGNOSIS — Z79899 Other long term (current) drug therapy: Secondary | ICD-10-CM | POA: Diagnosis not present

## 2022-05-27 DIAGNOSIS — K831 Obstruction of bile duct: Secondary | ICD-10-CM | POA: Diagnosis not present

## 2022-05-27 LAB — COMPLETE METABOLIC PANEL WITH GFR
AG Ratio: 0.6 (calc) — ABNORMAL LOW (ref 1.0–2.5)
ALT: 330 U/L — ABNORMAL HIGH (ref 6–29)
AST: 700 U/L — ABNORMAL HIGH (ref 10–35)
Albumin: 2.1 g/dL — ABNORMAL LOW (ref 3.6–5.1)
Alkaline phosphatase (APISO): 1447 U/L — ABNORMAL HIGH (ref 37–153)
BUN/Creatinine Ratio: 14 (calc) (ref 6–22)
BUN: 35 mg/dL — ABNORMAL HIGH (ref 7–25)
CO2: 23 mmol/L (ref 20–32)
Calcium: 7.8 mg/dL — ABNORMAL LOW (ref 8.6–10.4)
Chloride: 94 mmol/L — ABNORMAL LOW (ref 98–110)
Creat: 2.57 mg/dL — ABNORMAL HIGH (ref 0.60–0.95)
Globulin: 3.3 g/dL (calc) (ref 1.9–3.7)
Glucose, Bld: 103 mg/dL — ABNORMAL HIGH (ref 65–99)
Potassium: 4 mmol/L (ref 3.5–5.3)
Sodium: 129 mmol/L — ABNORMAL LOW (ref 135–146)
Total Bilirubin: 10.3 mg/dL — ABNORMAL HIGH (ref 0.2–1.2)
Total Protein: 5.4 g/dL — ABNORMAL LOW (ref 6.1–8.1)
eGFR: 18 mL/min/{1.73_m2} — ABNORMAL LOW (ref >=60)

## 2022-05-27 LAB — CBC WITH DIFFERENTIAL/PLATELET
Absolute Monocytes: 3092 cells/uL — ABNORMAL HIGH (ref 200–950)
Basophils Absolute: 58 cells/uL (ref 0–200)
Basophils Relative: 0.2 %
Eosinophils Absolute: 0 cells/uL — ABNORMAL LOW (ref 15–500)
Eosinophils Relative: 0 %
HCT: 24.8 % — ABNORMAL LOW (ref 35.0–45.0)
Hemoglobin: 8.1 g/dL — ABNORMAL LOW (ref 11.7–15.5)
Lymphs Abs: 1012 cells/uL (ref 850–3900)
MCH: 30.6 pg (ref 27.0–33.0)
MCHC: 32.7 g/dL (ref 32.0–36.0)
MCV: 93.6 fL (ref 80.0–100.0)
MPV: 9.8 fL (ref 7.5–12.5)
Monocytes Relative: 10.7 %
Neutro Abs: 24738 cells/uL — ABNORMAL HIGH (ref 1500–7800)
Neutrophils Relative %: 85.6 %
Platelets: 498 10*3/uL — ABNORMAL HIGH (ref 140–400)
RBC: 2.65 10*6/uL — ABNORMAL LOW (ref 3.80–5.10)
RDW: 13 % (ref 11.0–15.0)
Total Lymphocyte: 3.5 %
WBC: 28.9 10*3/uL — ABNORMAL HIGH (ref 3.8–10.8)

## 2022-05-27 LAB — HEPATITIS PANEL, ACUTE
HCV Ab: NONREACTIVE
Hep A IgM: NONREACTIVE
Hep B C IgM: NONREACTIVE
Hepatitis B Surface Ag: NONREACTIVE

## 2022-05-27 LAB — HEMOGLOBIN A1C
Hgb A1c MFr Bld: 7.2 % of total Hgb — ABNORMAL HIGH (ref <5.7)
Mean Plasma Glucose: 160 mg/dL
eAG (mmol/L): 8.9 mmol/L

## 2022-05-27 LAB — LIPID PANEL
Cholesterol: 208 mg/dL — ABNORMAL HIGH (ref <200)
HDL: <5 mg/dL — ABNORMAL LOW (ref >=50)
Triglycerides: 151 mg/dL — ABNORMAL HIGH (ref <150)

## 2022-05-27 LAB — TSH: TSH: 1.37 mIU/L (ref 0.40–4.50)

## 2022-05-27 MED ORDER — PIPERACILLIN-TAZOBACTAM 3.375 G IVPB
3.3750 g | Freq: Three times a day (TID) | INTRAVENOUS | Status: DC
  Administered 2022-05-27: 3.375 g via INTRAVENOUS
  Filled 2022-05-27: qty 50

## 2022-05-27 MED ORDER — SODIUM CHLORIDE 0.9 % IV SOLN: INTRAVENOUS | Status: DC

## 2022-05-27 NOTE — ED Provider Notes (Signed)
4:36 AM [facility] has called back and do not anticipate a bed being available soon. I have contacted the transfer center at [facility].   5:48 AM I have not yet heard back from [facility], will also reach out to [facility] now.   6:12 AM [facility] is unable to accept the patient due to being at capacity.   6:25 AM Spoke with Dr. Orion Crook, General Surgery at [facility] who will accept the patient ED-to-ED for further evaluation and management. Patient and family aware.    Truddie Hidden, MD 05/27/22 [phone]

## 2022-05-27 NOTE — ED Notes (Signed)
Pt alert, NAD, calm, interactive, family at [facility], denies sx or complaints, questions or needs.

## 2022-05-27 NOTE — ED Notes (Signed)
EDP Karle Starch at bedside. Bryson Corona Edd Fabian

## 2022-05-27 NOTE — ED Notes (Signed)
[facility] notified pt enroute

## 2022-05-27 NOTE — ED Notes (Signed)
Patient accepted to [facility] ED to ED. [facility] called and given information for transport. Transport will be available after shift change.

## 2022-05-28 DIAGNOSIS — K8689 Other specified diseases of pancreas: Secondary | ICD-10-CM | POA: Diagnosis not present

## 2022-05-28 DIAGNOSIS — N189 Chronic kidney disease, unspecified: Secondary | ICD-10-CM | POA: Diagnosis not present

## 2022-05-28 DIAGNOSIS — N179 Acute kidney failure, unspecified: Secondary | ICD-10-CM | POA: Diagnosis not present

## 2022-05-28 DIAGNOSIS — K822 Perforation of gallbladder: Secondary | ICD-10-CM | POA: Diagnosis not present

## 2022-05-29 DIAGNOSIS — R748 Abnormal levels of other serum enzymes: Secondary | ICD-10-CM | POA: Diagnosis not present

## 2022-05-29 DIAGNOSIS — Z4659 Encounter for fitting and adjustment of other gastrointestinal appliance and device: Secondary | ICD-10-CM | POA: Diagnosis not present

## 2022-05-29 DIAGNOSIS — R17 Unspecified jaundice: Secondary | ICD-10-CM | POA: Diagnosis not present

## 2022-05-29 DIAGNOSIS — R1312 Dysphagia, oropharyngeal phase: Secondary | ICD-10-CM | POA: Diagnosis not present

## 2022-05-29 DIAGNOSIS — K831 Obstruction of bile duct: Secondary | ICD-10-CM | POA: Diagnosis not present

## 2022-05-29 DIAGNOSIS — K838 Other specified diseases of biliary tract: Secondary | ICD-10-CM | POA: Diagnosis not present

## 2022-05-29 DIAGNOSIS — R8569 Abnormal cytological findings in specimens from other digestive organs and abdominal cavity: Secondary | ICD-10-CM | POA: Diagnosis not present

## 2022-05-30 DIAGNOSIS — K8689 Other specified diseases of pancreas: Secondary | ICD-10-CM | POA: Diagnosis not present

## 2022-05-30 DIAGNOSIS — K8020 Calculus of gallbladder without cholecystitis without obstruction: Secondary | ICD-10-CM | POA: Diagnosis not present

## 2022-05-30 DIAGNOSIS — N179 Acute kidney failure, unspecified: Secondary | ICD-10-CM | POA: Diagnosis not present

## 2022-05-30 DIAGNOSIS — K822 Perforation of gallbladder: Secondary | ICD-10-CM | POA: Diagnosis not present

## 2022-05-30 DIAGNOSIS — N189 Chronic kidney disease, unspecified: Secondary | ICD-10-CM | POA: Diagnosis not present

## 2022-05-31 ENCOUNTER — Telehealth: Payer: Self-pay | Admitting: *Deleted

## 2022-05-31 DIAGNOSIS — N179 Acute kidney failure, unspecified: Secondary | ICD-10-CM | POA: Diagnosis not present

## 2022-05-31 DIAGNOSIS — N189 Chronic kidney disease, unspecified: Secondary | ICD-10-CM | POA: Diagnosis not present

## 2022-05-31 DIAGNOSIS — K822 Perforation of gallbladder: Secondary | ICD-10-CM | POA: Diagnosis not present

## 2022-05-31 DIAGNOSIS — K8689 Other specified diseases of pancreas: Secondary | ICD-10-CM | POA: Diagnosis not present

## 2022-05-31 LAB — CULTURE, BLOOD (ROUTINE X 2)
Culture: NO GROWTH
Culture: NO GROWTH
Special Requests: ADEQUATE
Special Requests: ADEQUATE

## 2022-05-31 NOTE — Telephone Encounter (Signed)
Patient called and just wanted to let you know that she was at [facility]

## 2022-06-01 DIAGNOSIS — N189 Chronic kidney disease, unspecified: Secondary | ICD-10-CM | POA: Diagnosis not present

## 2022-06-01 DIAGNOSIS — N179 Acute kidney failure, unspecified: Secondary | ICD-10-CM | POA: Diagnosis not present

## 2022-06-01 DIAGNOSIS — R1312 Dysphagia, oropharyngeal phase: Secondary | ICD-10-CM | POA: Diagnosis not present

## 2022-06-01 DIAGNOSIS — K822 Perforation of gallbladder: Secondary | ICD-10-CM | POA: Diagnosis not present

## 2022-06-01 DIAGNOSIS — K8689 Other specified diseases of pancreas: Secondary | ICD-10-CM | POA: Diagnosis not present

## 2022-06-02 ENCOUNTER — Telehealth: Payer: Self-pay

## 2022-06-02 NOTE — Patient Outreach (Signed)
  Care Coordination TOC Note Transition Care Management Follow-up Telephone Call Date of discharge and from where: 06/01/22-[facility] Patient discharged s/p cholecystotomy tube placement, pancreatic malignancy-no further workup per patient request-per hospital notes- patient will likely transition to hospice once done with [facility] therapy How have you been since you were released from the hospital? Patient reports she "is doing and feeling wonderful." She is amazed and how well she is doing and states she "wish she "could show the doctors at the hospital how well she is doing." She denies any acute issues. States family is managing drainage bag and no drainage.  Any questions or concerns? No  Items Reviewed: Did the pt receive and understand the discharge instructions provided? Yes  Medications obtained and verified? Yes  Other? Yes -drainage bag Any new allergies since your discharge? No  Dietary orders reviewed? Yes Do you have support at home? Yes   Home Care and Equipment/Supplies: Were home health services ordered? yes If so, what is the name of the agency? [facility]  Has the agency set up a time to come to the patient's home? Yes-moing out Monday 06/05/22 Were any new equipment or medical supplies ordered?  Yes: -pt sent hoem with drainage bag which family managing What is the name of the medical supply agency? N/A Were you able to get the supplies/equipment? yes Do you have any questions related to the use of the equipment or supplies? No  Functional Questionnaire: (I = Independent and D = Dependent) ADLs: I  Bathing/Dressing- I  Meal Prep- I  Eating- I  Maintaining continence- I  Transferring/Ambulation- I  Managing Meds- I  Follow up appointments reviewed:  PCP Hospital f/u appt confirmed? Yes  Scheduled to see PCP office([facility]) on 06/08/22 @ 9:40am. [facility] f/u appt confirmed?  N/A . Are transportation arrangements needed? No  If their condition  worsens, is the pt aware to call PCP or go to the Emergency Dept.? Yes Was the patient provided with contact information for the PCP's office or ED? Yes Was to pt encouraged to call back with questions or concerns? Yes  SDOH assessments and interventions completed:   Yes  Care Coordination Interventions Activated:  Yes   Care Coordination Interventions:  Provided education and support    Encounter Outcome:  Pt. Visit Completed    Enzo Montgomery, RN,BSN,CCM [facility] Telephonic Care Management Coordinator Direct Phone: [phone] Toll Free: [phone] Fax: [phone]

## 2022-06-02 NOTE — Telephone Encounter (Signed)
Jeris Penta with [facility] called and left voicemail on clinical intake voicemail stating that patient was discharged from [facility] with home health orders for PT and patient wanted to delay start of care until Monday 06/05/22 around 9 AM. She wanted verbal order.   I returned call and verbal order was given.

## 2022-06-05 ENCOUNTER — Other Ambulatory Visit: Payer: Self-pay | Admitting: Nurse Practitioner

## 2022-06-05 DIAGNOSIS — Z794 Long term (current) use of insulin: Secondary | ICD-10-CM | POA: Diagnosis not present

## 2022-06-05 DIAGNOSIS — Z434 Encounter for attention to other artificial openings of digestive tract: Secondary | ICD-10-CM | POA: Diagnosis not present

## 2022-06-05 DIAGNOSIS — Z79891 Long term (current) use of opiate analgesic: Secondary | ICD-10-CM | POA: Diagnosis not present

## 2022-06-05 DIAGNOSIS — D631 Anemia in chronic kidney disease: Secondary | ICD-10-CM | POA: Diagnosis not present

## 2022-06-05 DIAGNOSIS — Z9181 History of falling: Secondary | ICD-10-CM | POA: Diagnosis not present

## 2022-06-05 DIAGNOSIS — N189 Chronic kidney disease, unspecified: Secondary | ICD-10-CM | POA: Diagnosis not present

## 2022-06-05 DIAGNOSIS — Z7982 Long term (current) use of aspirin: Secondary | ICD-10-CM | POA: Diagnosis not present

## 2022-06-05 DIAGNOSIS — E1165 Type 2 diabetes mellitus with hyperglycemia: Secondary | ICD-10-CM

## 2022-06-05 DIAGNOSIS — Z7984 Long term (current) use of oral hypoglycemic drugs: Secondary | ICD-10-CM | POA: Diagnosis not present

## 2022-06-05 DIAGNOSIS — N179 Acute kidney failure, unspecified: Secondary | ICD-10-CM | POA: Diagnosis not present

## 2022-06-05 DIAGNOSIS — K82A2 Perforation of gallbladder in cholecystitis: Secondary | ICD-10-CM | POA: Diagnosis not present

## 2022-06-05 DIAGNOSIS — E1122 Type 2 diabetes mellitus with diabetic chronic kidney disease: Secondary | ICD-10-CM | POA: Diagnosis not present

## 2022-06-05 DIAGNOSIS — R634 Abnormal weight loss: Secondary | ICD-10-CM | POA: Diagnosis not present

## 2022-06-05 DIAGNOSIS — K8689 Other specified diseases of pancreas: Secondary | ICD-10-CM | POA: Diagnosis not present

## 2022-06-05 NOTE — Progress Notes (Unsigned)
New Patient Office Visit  Subjective    Patient ID: Julie Baxter, female    DOB: 30-Jun-1936  Age: 86 y.o. MRN: [id number]  CC: No chief complaint on file.   HPI [name] presents to establish care. Oriented to practice routines and expectations. He has a history significant for hypertension, CKD, GERD, hyperlipidemia, and Type 2 Diabetes. Concerns today include ***.  She had a recent hospitalization for perforated gallbladder and pancreatic head mass where she had a cholecystectomy and subsequent pancreatic ventral duct and CBD stent placed. Per OSH notes 05/29/2022: -Patient opted for no further work-up of likely pancreatic malignancy, as her primary goals are limiting invasive procedures and comfort. She opted to go home with home health services to try to get some therapy and regain her functional status, and when needed will transition to home hospice services  Last physical: Breast cancer Colon cancer DEXA Diabetes eye/foot Tobacco Anxiety/depression Falls Immunizations   Outpatient Encounter Medications as of 06/06/2022  Medication Sig   aspirin EC 81 MG tablet Take 81 mg by mouth daily.   Blood Glucose Monitoring Suppl (ACCU-CHEK GUIDE ME) w/Device KIT 1 kit by Does not apply route as directed. ACCU CHEK GUIDE ME TEST STRIPS WITH KIT 180 strips, with 4 refills.   diclofenac Sodium (VOLTAREN) 1 % GEL Apply 2 g topically 4 (four) times daily as needed.   glipiZIDE (GLUCOTROL) 5 MG tablet TAKE 1 TABLET BY MOUTH TWICE  DAILY BEFORE MEALS (BREAKFAST  AND LUNCH)   glucose blood (ACCU-CHEK GUIDE) test strip Use to test blood sugar twice daily. Dx: E11.22   insulin detemir (LEVEMIR FLEXPEN) 100 UNIT/ML FlexPen Inject 10 Units into the skin daily.   Insulin Pen Needle (B-D UF III MINI PEN NEEDLES) 31G X 5 MM MISC Use to give insulin twice daily. Dx:E11.22   Propylene Glycol (SYSTANE BALANCE OP) Apply 1 drop to eye daily as needed (dry eyes).   simvastatin (ZOCOR) 40  MG tablet TAKE 1 TABLET BY MOUTH DAILY   sodium bicarbonate 650 MG tablet Take 1 tablet by mouth 3 (three) times daily.   TRADJENTA 5 MG TABS tablet TAKE 1 TABLET(5 MG) BY MOUTH DAILY   [DISCONTINUED] insulin detemir (LEVEMIR FLEXPEN) 100 UNIT/ML FlexPen Inject 20 Units into the skin daily.   No facility-administered encounter medications on file as of 06/06/2022.    Past Medical History:  Diagnosis Date   Anxiety    regarding surgery; occassionally pt. states   Anxiety state, unspecified    Arthritis    Asymptomatic varicose veins    Broken arm    Chronic kidney disease    Diabetic retinopathy ([place])    Edema    Hard of hearing    Hyperpotassemia    Hypertension    Inflammation of joint of knee    right knee   Insomnia, unspecified    Multiple thyroid nodules    Osteoarthrosis, unspecified whether generalized or localized, unspecified site    Other abnormal blood chemistry    Other and unspecified hyperlipidemia    Other malaise and fatigue    Other specified disease of nail    Overweight(278.02)    Pain in joint, lower leg    Pain in joint, pelvic region and thigh    Peripheral vascular disease ([place])    varicose veins-  "Blood Clot anterior left LEG WITH PREGNANCY"   PONV (postoperative nausea and vomiting)    pt. states that she was sick after hip surgery and oral surgery  Type II or unspecified type diabetes mellitus with renal manifestations, not stated as uncontrolled(250.40)    Undiagnosed cardiac murmurs    Unspecified disorder of kidney and ureter    Unspecified disorder of kidney and ureter    Urinary tract infection, site not specified     Past Surgical History:  Procedure Laterality Date   ABDOMINAL HYSTERECTOMY     EYE SURGERY Bilateral    cataract extraction with IOL   LIPOMA EXCISION     x2  bilateral buttocks   spinal meningitis  1957   TONSILLECTOMY     TOTAL HIP ARTHROPLASTY Right 11/29/2012   Procedure: RIGHT TOTAL HIP ARTHROPLASTY ANTERIOR  APPROACH;  Surgeon: Mcarthur Rossetti, MD;  Location: [facility];  Service: Orthopedics;  Laterality: Right;   TOTAL HIP ARTHROPLASTY Left 06/01/2015   Procedure: LEFT TOTAL HIP ARTHROPLASTY ANTERIOR APPROACH;  Surgeon: Mcarthur Rossetti, MD;  Location: [facility];  Service: Orthopedics;  Laterality: Left;    No family history on file.  Social History   Socioeconomic History   Marital status: Single    Spouse name: Not on file   Number of children: Not on file   Years of education: Not on file   Highest education level: Not on file  Occupational History   Not on file  Tobacco Use   Smoking status: Never   Smokeless tobacco: Never  Vaping Use   Vaping Use: Never used  Substance and Sexual Activity   Alcohol use: No   Drug use: No   Sexual activity: Not on file  Other Topics Concern   Not on file  Social History Narrative   Not on file   Social Determinants of Health   Financial Resource Strain: Not on file  Food Insecurity: No Food Insecurity (06/02/2022)   Hunger Vital Sign    Worried About Running Out of Food in the Last Year: Never true    Ran Out of Food in the Last Year: Never true  Transportation Needs: No Transportation Needs (06/02/2022)   PRAPARE - Hydrologist (Medical): No    Lack of Transportation (Non-Medical): No  Physical Activity: Not on file  Stress: Not on file  Social Connections: Not on file  Intimate Partner Violence: Not on file    ROS      Objective    There were no vitals taken for this visit.  Physical Exam     Assessment & Plan:   Problem List Items Addressed This Visit   None   No follow-ups on file.   Rubie Maid, FNP

## 2022-06-06 ENCOUNTER — Encounter: Payer: Self-pay | Admitting: Family Medicine

## 2022-06-06 ENCOUNTER — Ambulatory Visit (INDEPENDENT_AMBULATORY_CARE_PROVIDER_SITE_OTHER): Payer: Medicare Other | Admitting: Family Medicine

## 2022-06-06 VITALS — BP 130/90 | HR 94 | Temp 98.1°F | Ht 67.0 in | Wt 154.0 lb

## 2022-06-06 DIAGNOSIS — N184 Chronic kidney disease, stage 4 (severe): Secondary | ICD-10-CM

## 2022-06-06 DIAGNOSIS — K8689 Other specified diseases of pancreas: Secondary | ICD-10-CM

## 2022-06-06 DIAGNOSIS — Z0000 Encounter for general adult medical examination without abnormal findings: Secondary | ICD-10-CM | POA: Diagnosis not present

## 2022-06-06 DIAGNOSIS — E782 Mixed hyperlipidemia: Secondary | ICD-10-CM | POA: Diagnosis not present

## 2022-06-06 DIAGNOSIS — E559 Vitamin D deficiency, unspecified: Secondary | ICD-10-CM | POA: Diagnosis not present

## 2022-06-06 DIAGNOSIS — Z5320 Procedure and treatment not carried out because of patient's decision for unspecified reasons: Secondary | ICD-10-CM | POA: Diagnosis not present

## 2022-06-06 DIAGNOSIS — E113553 Type 2 diabetes mellitus with stable proliferative diabetic retinopathy, bilateral: Secondary | ICD-10-CM

## 2022-06-06 DIAGNOSIS — Z7689 Persons encountering health services in other specified circumstances: Secondary | ICD-10-CM | POA: Diagnosis not present

## 2022-06-06 DIAGNOSIS — Z136 Encounter for screening for cardiovascular disorders: Secondary | ICD-10-CM | POA: Diagnosis not present

## 2022-06-06 DIAGNOSIS — E538 Deficiency of other specified B group vitamins: Secondary | ICD-10-CM | POA: Diagnosis not present

## 2022-06-06 NOTE — Patient Instructions (Addendum)
Please let me know if you are interested in pursuing hospice or need any further home health services.  You have a referral for Gastroenterology with Dr Gala Romney.  You will need to schedule for interventional radiology to remove your drain when you are ready.  I have ordered an abdominal x-ray at [facility] to evaluate your pancreatic stent. I will call you with your lab results. Continue Diabtes medications as ordered and please seek medical care if your blood glucose readings are not staying between 120-180 regularly. Please follow-up with me in 3 months.

## 2022-06-07 ENCOUNTER — Telehealth: Payer: Self-pay | Admitting: Family Medicine

## 2022-06-07 DIAGNOSIS — K8689 Other specified diseases of pancreas: Secondary | ICD-10-CM | POA: Diagnosis not present

## 2022-06-07 DIAGNOSIS — N179 Acute kidney failure, unspecified: Secondary | ICD-10-CM | POA: Diagnosis not present

## 2022-06-07 DIAGNOSIS — R634 Abnormal weight loss: Secondary | ICD-10-CM | POA: Diagnosis not present

## 2022-06-07 DIAGNOSIS — K82A2 Perforation of gallbladder in cholecystitis: Secondary | ICD-10-CM | POA: Diagnosis not present

## 2022-06-07 DIAGNOSIS — Z434 Encounter for attention to other artificial openings of digestive tract: Secondary | ICD-10-CM | POA: Diagnosis not present

## 2022-06-07 DIAGNOSIS — Z7984 Long term (current) use of oral hypoglycemic drugs: Secondary | ICD-10-CM | POA: Diagnosis not present

## 2022-06-07 DIAGNOSIS — Z9181 History of falling: Secondary | ICD-10-CM | POA: Diagnosis not present

## 2022-06-07 DIAGNOSIS — Z794 Long term (current) use of insulin: Secondary | ICD-10-CM | POA: Diagnosis not present

## 2022-06-07 DIAGNOSIS — E1122 Type 2 diabetes mellitus with diabetic chronic kidney disease: Secondary | ICD-10-CM | POA: Diagnosis not present

## 2022-06-07 DIAGNOSIS — N189 Chronic kidney disease, unspecified: Secondary | ICD-10-CM | POA: Diagnosis not present

## 2022-06-07 DIAGNOSIS — Z7982 Long term (current) use of aspirin: Secondary | ICD-10-CM | POA: Diagnosis not present

## 2022-06-07 DIAGNOSIS — Z79891 Long term (current) use of opiate analgesic: Secondary | ICD-10-CM | POA: Diagnosis not present

## 2022-06-07 DIAGNOSIS — D631 Anemia in chronic kidney disease: Secondary | ICD-10-CM | POA: Diagnosis not present

## 2022-06-07 LAB — HEMOGLOBIN A1C
Hgb A1c MFr Bld: 6.6 % of total Hgb — ABNORMAL HIGH (ref <5.7)
Mean Plasma Glucose: 143 mg/dL
eAG (mmol/L): 7.9 mmol/L

## 2022-06-07 LAB — COMPLETE METABOLIC PANEL WITH GFR
AG Ratio: 0.7 (calc) — ABNORMAL LOW (ref 1.0–2.5)
ALT: 39 U/L — ABNORMAL HIGH (ref 6–29)
AST: 21 U/L (ref 10–35)
Albumin: 2.5 g/dL — ABNORMAL LOW (ref 3.6–5.1)
Alkaline phosphatase (APISO): 465 U/L — ABNORMAL HIGH (ref 37–153)
BUN/Creatinine Ratio: 13 (calc) (ref 6–22)
BUN: 25 mg/dL (ref 7–25)
CO2: 22 mmol/L (ref 20–32)
Calcium: 7.6 mg/dL — ABNORMAL LOW (ref 8.6–10.4)
Chloride: 103 mmol/L (ref 98–110)
Creat: 1.88 mg/dL — ABNORMAL HIGH (ref 0.60–0.95)
Globulin: 3.5 g/dL (calc) (ref 1.9–3.7)
Glucose, Bld: 282 mg/dL — ABNORMAL HIGH (ref 65–99)
Potassium: 4.4 mmol/L (ref 3.5–5.3)
Sodium: 135 mmol/L (ref 135–146)
Total Bilirubin: 3.1 mg/dL — ABNORMAL HIGH (ref 0.2–1.2)
Total Protein: 6 g/dL — ABNORMAL LOW (ref 6.1–8.1)
eGFR: 26 mL/min/{1.73_m2} — ABNORMAL LOW (ref >=60)

## 2022-06-07 LAB — CBC WITH DIFFERENTIAL/PLATELET
Absolute Monocytes: 1665 cells/uL — ABNORMAL HIGH (ref 200–950)
Basophils Absolute: 54 cells/uL (ref 0–200)
Basophils Relative: 0.3 %
Eosinophils Absolute: 125 cells/uL (ref 15–500)
Eosinophils Relative: 0.7 %
HCT: 23.8 % — ABNORMAL LOW (ref 35.0–45.0)
Hemoglobin: 7.8 g/dL — ABNORMAL LOW (ref 11.7–15.5)
Lymphs Abs: 1897 cells/uL (ref 850–3900)
MCH: 31.8 pg (ref 27.0–33.0)
MCHC: 32.8 g/dL (ref 32.0–36.0)
MCV: 97.1 fL (ref 80.0–100.0)
MPV: 9.6 fL (ref 7.5–12.5)
Monocytes Relative: 9.3 %
Neutro Abs: 14159 cells/uL — ABNORMAL HIGH (ref 1500–7800)
Neutrophils Relative %: 79.1 %
Platelets: 499 10*3/uL — ABNORMAL HIGH (ref 140–400)
RBC: 2.45 10*6/uL — ABNORMAL LOW (ref 3.80–5.10)
RDW: 14.9 % (ref 11.0–15.0)
Total Lymphocyte: 10.6 %
WBC: 17.9 10*3/uL — ABNORMAL HIGH (ref 3.8–10.8)

## 2022-06-07 LAB — LIPID PANEL
Cholesterol: 103 mg/dL (ref <200)
HDL: 34 mg/dL — ABNORMAL LOW (ref >=50)
LDL Cholesterol (Calc): 50 mg/dL (calc)
Non-HDL Cholesterol (Calc): 69 mg/dL (calc) (ref <130)
Total CHOL/HDL Ratio: 3 (calc) (ref <5.0)
Triglycerides: 111 mg/dL (ref <150)

## 2022-06-07 LAB — VITAMIN D 25 HYDROXY (VIT D DEFICIENCY, FRACTURES): Vit D, 25-Hydroxy: 18 ng/mL — ABNORMAL LOW (ref 30–100)

## 2022-06-07 NOTE — Telephone Encounter (Signed)
Spoke with patient regarding her labs and plan of care. I discussed bringing her in with family to discuss her diagnosis, prognosis, wishes, and plan of care with Dr Dennard Schaumann and myself and she would like to defer this until her daughter-in-law returns to town and she has had a break from her hospitalization. Her ultimate goal is "quality of life, not quantity of life" and she is enjoying spending time at home with her husband. At this time she states she would not like any invasive measures but will continue with her medications and home health PT.I am unsure if she understands the severity of her illness but myself and her family fully support her decision not to seek further treatment at this time.

## 2022-06-08 ENCOUNTER — Ambulatory Visit: Payer: Medicare Other | Admitting: Adult Health

## 2022-06-08 ENCOUNTER — Other Ambulatory Visit: Payer: Self-pay

## 2022-06-09 ENCOUNTER — Telehealth: Payer: Self-pay

## 2022-06-09 ENCOUNTER — Other Ambulatory Visit (HOSPITAL_COMMUNITY): Payer: Self-pay

## 2022-06-09 ENCOUNTER — Other Ambulatory Visit: Payer: Self-pay

## 2022-06-09 DIAGNOSIS — Z794 Long term (current) use of insulin: Secondary | ICD-10-CM | POA: Diagnosis not present

## 2022-06-09 DIAGNOSIS — Z7982 Long term (current) use of aspirin: Secondary | ICD-10-CM | POA: Diagnosis not present

## 2022-06-09 DIAGNOSIS — D649 Anemia, unspecified: Secondary | ICD-10-CM

## 2022-06-09 DIAGNOSIS — Z9181 History of falling: Secondary | ICD-10-CM | POA: Diagnosis not present

## 2022-06-09 DIAGNOSIS — N179 Acute kidney failure, unspecified: Secondary | ICD-10-CM | POA: Diagnosis not present

## 2022-06-09 DIAGNOSIS — D631 Anemia in chronic kidney disease: Secondary | ICD-10-CM | POA: Diagnosis not present

## 2022-06-09 DIAGNOSIS — Z7984 Long term (current) use of oral hypoglycemic drugs: Secondary | ICD-10-CM | POA: Diagnosis not present

## 2022-06-09 DIAGNOSIS — Z79891 Long term (current) use of opiate analgesic: Secondary | ICD-10-CM | POA: Diagnosis not present

## 2022-06-09 DIAGNOSIS — N189 Chronic kidney disease, unspecified: Secondary | ICD-10-CM | POA: Diagnosis not present

## 2022-06-09 DIAGNOSIS — K8689 Other specified diseases of pancreas: Secondary | ICD-10-CM | POA: Diagnosis not present

## 2022-06-09 DIAGNOSIS — E1122 Type 2 diabetes mellitus with diabetic chronic kidney disease: Secondary | ICD-10-CM | POA: Diagnosis not present

## 2022-06-09 DIAGNOSIS — Z434 Encounter for attention to other artificial openings of digestive tract: Secondary | ICD-10-CM | POA: Diagnosis not present

## 2022-06-09 DIAGNOSIS — R634 Abnormal weight loss: Secondary | ICD-10-CM | POA: Diagnosis not present

## 2022-06-09 DIAGNOSIS — K82A2 Perforation of gallbladder in cholecystitis: Secondary | ICD-10-CM | POA: Diagnosis not present

## 2022-06-09 NOTE — Telephone Encounter (Signed)
FYI:  Spoke w/Steve Ruthann [facility] w/[facility] regarding Blood Transfusion for pt. Told Richardson Landry pt needs 2unit of RBC orders per Provider.  Spoke w/Steve, they will do another type/cross before any transfusion. It will take 2hrs per units   Per Richardson Landry, will put the order in and provider just have to sign it. Richardson Landry will also call pt to schedule appt for 06/13/22.

## 2022-06-13 ENCOUNTER — Encounter (HOSPITAL_COMMUNITY)
Admission: RE | Admit: 2022-06-13 | Discharge: 2022-06-13 | Disposition: A | Discharge status: Home / Self Care | Payer: Medicare Other | Source: Ambulatory Visit | Attending: Family Medicine | Admitting: Family Medicine

## 2022-06-13 DIAGNOSIS — D6489 Other specified anemias: Secondary | ICD-10-CM | POA: Diagnosis present

## 2022-06-13 DIAGNOSIS — K82A2 Perforation of gallbladder in cholecystitis: Secondary | ICD-10-CM | POA: Diagnosis not present

## 2022-06-13 DIAGNOSIS — N179 Acute kidney failure, unspecified: Secondary | ICD-10-CM | POA: Diagnosis not present

## 2022-06-13 DIAGNOSIS — R634 Abnormal weight loss: Secondary | ICD-10-CM | POA: Diagnosis not present

## 2022-06-13 DIAGNOSIS — B961 Klebsiella pneumoniae [K. pneumoniae] as the cause of diseases classified elsewhere: Secondary | ICD-10-CM | POA: Diagnosis not present

## 2022-06-13 DIAGNOSIS — Z434 Encounter for attention to other artificial openings of digestive tract: Secondary | ICD-10-CM | POA: Diagnosis not present

## 2022-06-13 DIAGNOSIS — K8689 Other specified diseases of pancreas: Secondary | ICD-10-CM | POA: Diagnosis not present

## 2022-06-13 DIAGNOSIS — D649 Anemia, unspecified: Secondary | ICD-10-CM | POA: Diagnosis not present

## 2022-06-13 DIAGNOSIS — E1122 Type 2 diabetes mellitus with diabetic chronic kidney disease: Secondary | ICD-10-CM | POA: Diagnosis not present

## 2022-06-13 LAB — PREPARE RBC (CROSSMATCH)

## 2022-06-13 MED ORDER — SODIUM CHLORIDE 0.9% IV SOLUTION: Freq: Once | INTRAVENOUS | Status: AC

## 2022-06-13 NOTE — Addendum Note (Signed)
Addended by: Rubie Maid on: 06/13/2022 11:18 AM   Modules accepted: Level of Service

## 2022-06-13 NOTE — Progress Notes (Signed)
Diagnosis: Acute Anemia  Provider:  Mila Merry NP  Procedure: Infusion  IV Type: Peripheral, IV Location: L Antecubital  PRBCs, Dose: 2 units   Post Infusion IV Care: Observation period completed and Peripheral IV Discontinued  Discharge: Condition: Good, Destination: Home . AVS provided to patient.   Performed by:  Hughie Closs, RN

## 2022-06-14 ENCOUNTER — Encounter: Payer: Self-pay | Admitting: *Deleted

## 2022-06-14 DIAGNOSIS — Z9181 History of falling: Secondary | ICD-10-CM | POA: Diagnosis not present

## 2022-06-14 DIAGNOSIS — Z434 Encounter for attention to other artificial openings of digestive tract: Secondary | ICD-10-CM | POA: Diagnosis not present

## 2022-06-14 DIAGNOSIS — Z7982 Long term (current) use of aspirin: Secondary | ICD-10-CM | POA: Diagnosis not present

## 2022-06-14 DIAGNOSIS — K82A2 Perforation of gallbladder in cholecystitis: Secondary | ICD-10-CM | POA: Diagnosis not present

## 2022-06-14 DIAGNOSIS — E1122 Type 2 diabetes mellitus with diabetic chronic kidney disease: Secondary | ICD-10-CM | POA: Diagnosis not present

## 2022-06-14 DIAGNOSIS — D631 Anemia in chronic kidney disease: Secondary | ICD-10-CM | POA: Diagnosis not present

## 2022-06-14 DIAGNOSIS — Z7984 Long term (current) use of oral hypoglycemic drugs: Secondary | ICD-10-CM | POA: Diagnosis not present

## 2022-06-14 DIAGNOSIS — N189 Chronic kidney disease, unspecified: Secondary | ICD-10-CM | POA: Diagnosis not present

## 2022-06-14 DIAGNOSIS — N179 Acute kidney failure, unspecified: Secondary | ICD-10-CM | POA: Diagnosis not present

## 2022-06-14 DIAGNOSIS — K8689 Other specified diseases of pancreas: Secondary | ICD-10-CM | POA: Diagnosis not present

## 2022-06-14 DIAGNOSIS — Z794 Long term (current) use of insulin: Secondary | ICD-10-CM | POA: Diagnosis not present

## 2022-06-14 DIAGNOSIS — R634 Abnormal weight loss: Secondary | ICD-10-CM | POA: Diagnosis not present

## 2022-06-14 DIAGNOSIS — Z79891 Long term (current) use of opiate analgesic: Secondary | ICD-10-CM | POA: Diagnosis not present

## 2022-06-14 LAB — BPAM RBC
Blood Product Expiration Date: 202311172359
Blood Product Expiration Date: 202311182359
ISSUE DATE / TIME: 202310170937
ISSUE DATE / TIME: 202310171134
Unit Type and Rh: 600
Unit Type and Rh: 600

## 2022-06-14 LAB — TYPE AND SCREEN
ABO/RH(D): A NEG
Antibody Screen: NEGATIVE
Unit division: 0
Unit division: 0

## 2022-06-15 DIAGNOSIS — K8689 Other specified diseases of pancreas: Secondary | ICD-10-CM | POA: Diagnosis not present

## 2022-06-15 DIAGNOSIS — Z7984 Long term (current) use of oral hypoglycemic drugs: Secondary | ICD-10-CM | POA: Diagnosis not present

## 2022-06-15 DIAGNOSIS — R634 Abnormal weight loss: Secondary | ICD-10-CM | POA: Diagnosis not present

## 2022-06-15 DIAGNOSIS — Z7982 Long term (current) use of aspirin: Secondary | ICD-10-CM | POA: Diagnosis not present

## 2022-06-15 DIAGNOSIS — Z79891 Long term (current) use of opiate analgesic: Secondary | ICD-10-CM | POA: Diagnosis not present

## 2022-06-15 DIAGNOSIS — N179 Acute kidney failure, unspecified: Secondary | ICD-10-CM | POA: Diagnosis not present

## 2022-06-15 DIAGNOSIS — Z794 Long term (current) use of insulin: Secondary | ICD-10-CM | POA: Diagnosis not present

## 2022-06-15 DIAGNOSIS — E1122 Type 2 diabetes mellitus with diabetic chronic kidney disease: Secondary | ICD-10-CM | POA: Diagnosis not present

## 2022-06-15 DIAGNOSIS — Z434 Encounter for attention to other artificial openings of digestive tract: Secondary | ICD-10-CM | POA: Diagnosis not present

## 2022-06-15 DIAGNOSIS — N189 Chronic kidney disease, unspecified: Secondary | ICD-10-CM | POA: Diagnosis not present

## 2022-06-15 DIAGNOSIS — D631 Anemia in chronic kidney disease: Secondary | ICD-10-CM | POA: Diagnosis not present

## 2022-06-15 DIAGNOSIS — Z9181 History of falling: Secondary | ICD-10-CM | POA: Diagnosis not present

## 2022-06-15 DIAGNOSIS — K82A2 Perforation of gallbladder in cholecystitis: Secondary | ICD-10-CM | POA: Diagnosis not present

## 2022-06-30 ENCOUNTER — Ambulatory Visit (HOSPITAL_COMMUNITY)
Admission: RE | Admit: 2022-06-30 | Discharge: 2022-06-30 | Disposition: A | Discharge status: Home / Self Care | Payer: Medicare Other | Source: Ambulatory Visit | Attending: Family Medicine | Admitting: Family Medicine

## 2022-06-30 ENCOUNTER — Ambulatory Visit (INDEPENDENT_AMBULATORY_CARE_PROVIDER_SITE_OTHER): Payer: Medicare Other | Admitting: Gastroenterology

## 2022-06-30 ENCOUNTER — Encounter: Payer: Self-pay | Admitting: Gastroenterology

## 2022-06-30 VITALS — BP 139/66 | HR 102 | Temp 98.1°F | Ht 65.0 in | Wt 146.0 lb

## 2022-06-30 DIAGNOSIS — I878 Other specified disorders of veins: Secondary | ICD-10-CM | POA: Diagnosis not present

## 2022-06-30 DIAGNOSIS — K8689 Other specified diseases of pancreas: Secondary | ICD-10-CM

## 2022-06-30 DIAGNOSIS — I700 Atherosclerosis of aorta: Secondary | ICD-10-CM | POA: Diagnosis not present

## 2022-06-30 DIAGNOSIS — R197 Diarrhea, unspecified: Secondary | ICD-10-CM

## 2022-06-30 DIAGNOSIS — D649 Anemia, unspecified: Secondary | ICD-10-CM

## 2022-06-30 DIAGNOSIS — Z96643 Presence of artificial hip joint, bilateral: Secondary | ICD-10-CM | POA: Diagnosis not present

## 2022-06-30 MED ORDER — ZENPEP 40000-126000 UNITS PO CPEP: ORAL_CAPSULE | ORAL | 0 refills | Status: DC

## 2022-06-30 NOTE — Progress Notes (Unsigned)
GI Office Note    Referring Provider: Rubie Maid, FNP Primary Care Physician:  Rubie Maid, FNP  Primary Gastroenterologist:  Chief Complaint   Chief Complaint  Patient presents with   abnormal imaging    Has biliary bag from blocked bile duct, done at [facility].   Diarrhea    Frequent loose stools not really diarrhea     History of Present Illness   Julie Baxter is a 86 y.o. female presenting today   \  Today: Weight 200 pounds August 2022.  146 pounds today.  She has lost 8 more pounds since she was in the hospital.   Got two unites 10/18.  ***needs CBC, LFTS,  Fatigue No melena, brbpr.  Month between the fall and hospital Break arm 10/2020, then weight loss. Humerus.    Two months, urgency and frequency. Not every day. Some days, 7-8 times. Feels like needs to go but small. Last few days, watery stools. Mush. Pepto can help.    Pancreatic mass, perforated gallbladder, perforated gallbladder and biliary obstruction from pancreatic mass  Admission September 30 through October 5  Admitted from outside hospital today for acutely perforated gallbladder and pancreatic head mass. Underwent percutaneous cholecystostomy tube placement with IR on September 30 Outpatient interventional biliary referral placed at patient's request. For stent prophylaxis she was on moxifloxacin 400 mg daily, end of treatment October 8. Patient opted for no further work-up of likely pancreatic malignancy as her primary goals are limiting invasive procedures and comfort.  She opted to go home with home health services to try to get some therapy and regain her functional status and when needed will transition to home hospice services.  KUB in 10 to 14 days recommended to confirm spontaneous passage of pancreatic stent but patient declined this as she would not want to have it removed endoscopically anyway.  Patient decided to leave PCT tube in place, advised that she can reach out to  outpatient IR have removed if she elects.  Presentation she reported 50 pound weight loss over the past year.  No prior colonoscopy.  Complained of right upper quadrant pain, jaundice when she presented to the PCP on September 29.  LFTs were markedly elevated with direct bili of 7.3, AST greater than 1000, ALT 400, alk phos greater than 1500.  CT abdomen and pelvis demonstrated perforated acute cholecystitis with gallbladder wall continuity, pericholecystic fluid, posterior right upper quadrant fluid collection which communicated with gallbladder fossa.  There was concern for pancreatic head mass leading to biliary obstruction.  Patient underwent ERCP with prophylactic pancreatic duct stent, biliary stent and common bile duct, cytology brushings which were inconclusive on pathology.  Hepatology team discussed endoscopic ultrasound with biopsy with patient however given likelihood of pancreatic malignancy and patient's goals of care she opted not to proceed with any invasive additional testing.  Patient stated that additional testing would not be valuable as she did not want to pursue any type of directed therapy if she were to have pancreatic cancer.  Was on Zosyn, transition to Unasyn based on Klebsiella sensitivities from gallbladder fluid, later culture grew E. coli and was intermediately sensitive to Unasyn.  She was switched to p.o. moxifloxacin to improve coverage.   CT abd/pelvis w/o contrast 05/26/22 (OSH study, in-house interpretation)- IMPRESSION: 1. Findings consistent with perforated cholecystitis. 2. Heterogeneous appearance of pancreatic head with marked intra and extrahepatic biliary ductal dilatation, concerning for underlying pancreatic head mass. 3. Loculated fluid in the right retroperitoneum.  Unable to assess for superimposed infection in the absence of intravenous contrast. 4. Age-indeterminate compression deformity at the inferior endplate of L2 (series 6, image 84). Recommend  correlation with point tenderness.  MRCP w/o contrast 05/30/22- Impression: 1. Exam markedly limited by noncontrast technique and motion. Repeat MRI w/wo contrast can be considered if clinically warranted. 2. Marked upstream ductal dilatation and parenchymal atrophy is highly suspicious for underlying malignancy. 3. Decreased intrahepatic and extrahepatic biliary dilatation status post CBD stent placement. 4. Decompressed thick-walled gallbladder with cholelithiasis. Percutaneous cystostomy in place. No large pericholecystic fluid collection. 5. Stable small loculated fluid collection in the posterior right retroperitoneum and posterior to the right hepatic lobe.  Endoscopic Procedures: ERCP 05/29/22- Impression: - Retained food noted in the stomach. - The major papilla and adjacent duodenum appeared edematous. - A single localized biliary stricture was found in the lower third of the main bile duct. The stricture was indeterminate. - The upper third of the main bile duct and middle third of the main bile duct were dilated. - One prophylactic pancreatic stent was placed into the ventral pancreatic duct. - A biliary sphincterotomy was performed. - Cells for cytology obtained in the lower third of the main duct. - One biliary stent was placed into the common bile duct. Recommendation: - Return patient to hospital ward for ongoing care. - Watch for pancreatitis, bleeding, perforation, and cholangitis. - Await cytology results. - If cytology negative consider EUS. - Hold anticoagulation for 48 hours. - Request that referring physician please obtain a KUB X-ray in 10-14 days, to determine if the pancreatic stent has passed spontaneously. If the stent has not passed, an EGD will be required for stent removal. Patient is to follow up with referring MD for KUB and stent removal if necessary. If patient or referring MD would prefer stent removal be done at [facility], please refer back as  necessary (call [phone] to schedule). Please fax a copy of KUB report and EGD report (if EGD performed) to [phone] for documentation purposes. - Return to this GI lab for stent exchange at ERCP in 3 months. - The findings and recommendations were discussed with the patient. - The findings and recommendations were discussed with the referring physician.  Pathology: Specimen Source A Bile duct stricture brushing Diagnostic Interpretation A ATYPICAL/INCONCLUSIVE.   MRI abdomen/MRCP without contrast May 30, 2022: Impression:  1. Exam markedly limited by noncontrast technique and motion. Repeat MRI  w/wo contrast can be considered if clinically warranted.  2.  Marked upstream ductal dilatation and parenchymal atrophy is highly  suspicious for underlying malignancy.  3.  Decreased intrahepatic and extrahepatic biliary dilatation status post  CBD stent placement.  4.  Decompressed thick-walled gallbladder with cholelithiasis. Percutaneous  cystostomy in place. No large pericholecystic fluid collection.  5.  Stable small loculated fluid collection in the posterior right  retroperitoneum and posterior to the right hepatic lobe.   From June 01, 2022: White blood cell count 18,600, hemoglobin 8.1, platelets 485,000, glucose 254, creatinine 2.6, BUN 35, total bilirubin 4.0, alkaline phosphatase 607, AST 43, ALT 82, albumin 1.2.  CA 19-9 544 CEA 2.4 Medications   Current Outpatient Medications  Medication Sig Dispense Refill   Blood Glucose Monitoring Suppl (ACCU-CHEK GUIDE ME) w/Device KIT 1 kit by Does not apply route as directed. ACCU CHEK GUIDE ME TEST STRIPS WITH KIT 180 strips, with 4 refills. 1 kit 0   cholecalciferol (VITAMIN D3) 25 MCG (1000 UNIT) tablet Take 1,000 Units by mouth daily.  diclofenac Sodium (VOLTAREN) 1 % GEL Apply 2 g topically 4 (four) times daily as needed. 300 g 1   glipiZIDE (GLUCOTROL) 5 MG tablet TAKE 1 TABLET BY MOUTH TWICE  DAILY BEFORE  MEALS (BREAKFAST  AND LUNCH) 200 tablet 2   glucose blood (ACCU-CHEK GUIDE) test strip Use to test blood sugar twice daily. Dx: E11.22 200 each 3   insulin detemir (LEVEMIR FLEXPEN) 100 UNIT/ML FlexPen Inject 10 Units into the skin daily.     Insulin Pen Needle (B-D UF III MINI PEN NEEDLES) 31G X 5 MM MISC Use to give insulin twice daily. Dx:E11.22 100 each 3   Lactobacillus (PROBIOTIC ACIDOPHILUS PO) Take by mouth.     Propylene Glycol (SYSTANE BALANCE OP) Apply 1 drop to eye daily as needed (dry eyes).     simvastatin (ZOCOR) 40 MG tablet TAKE 1 TABLET BY MOUTH DAILY 100 tablet 2   sodium bicarbonate 650 MG tablet Take 1 tablet by mouth 3 (three) times daily.     TRADJENTA 5 MG TABS tablet TAKE 1 TABLET(5 MG) BY MOUTH DAILY 90 tablet 1   No current facility-administered medications for this visit.    Allergies   Allergies as of 06/30/2022 - Review Complete 06/30/2022  Allergen Reaction Noted   Elemental sulfur Hives 05/20/2015   Naproxen Swelling and Other (See Comments) 11/12/2012   Tramadol Nausea And Vomiting 11/12/2012    Past Medical History   Past Medical History:  Diagnosis Date   Anxiety    regarding surgery; occassionally pt. states   Anxiety state, unspecified    Arthritis    Asymptomatic varicose veins    Broken arm    Chronic kidney disease    Diabetic retinopathy ([place])    Edema    Hard of hearing    Hyperpotassemia    Hypertension    Inflammation of joint of knee    right knee   Insomnia, unspecified    Multiple thyroid nodules    Osteoarthrosis, unspecified whether generalized or localized, unspecified site    Other abnormal blood chemistry    Other and unspecified hyperlipidemia    Other malaise and fatigue    Other specified disease of nail    Overweight(278.02)    Pain in joint, lower leg    Pain in joint, pelvic region and thigh    Peripheral vascular disease ([place])    varicose veins-  "Blood Clot anterior left LEG WITH PREGNANCY"   PONV  (postoperative nausea and vomiting)    pt. states that she was sick after hip surgery and oral surgery   Type II or unspecified type diabetes mellitus with renal manifestations, not stated as uncontrolled(250.40)    Undiagnosed cardiac murmurs    Unspecified disorder of kidney and ureter    Unspecified disorder of kidney and ureter    Urinary tract infection, site not specified     Past Surgical History   Past Surgical History:  Procedure Laterality Date   ABDOMINAL HYSTERECTOMY     EYE SURGERY Bilateral    cataract extraction with IOL   LIPOMA EXCISION     x2  bilateral buttocks   spinal meningitis  1957   TONSILLECTOMY     TOTAL HIP ARTHROPLASTY Right 11/29/2012   Procedure: RIGHT TOTAL HIP ARTHROPLASTY ANTERIOR APPROACH;  Surgeon: Mcarthur Rossetti, MD;  Location: [facility];  Service: Orthopedics;  Laterality: Right;   TOTAL HIP ARTHROPLASTY Left 06/01/2015   Procedure: LEFT TOTAL HIP ARTHROPLASTY ANTERIOR APPROACH;  Surgeon: Mcarthur Rossetti,  MD;  Location: [facility];  Service: Orthopedics;  Laterality: Left;    Past Family History   No family history on file.  Past Social History   Social History   Socioeconomic History   Marital status: Single    Spouse name: Not on file   Number of children: Not on file   Years of education: Not on file   Highest education level: Not on file  Occupational History   Not on file  Tobacco Use   Smoking status: Never   Smokeless tobacco: Never  Vaping Use   Vaping Use: Never used  Substance and Sexual Activity   Alcohol use: No   Drug use: No   Sexual activity: Not Currently  Other Topics Concern   Not on file  Social History Narrative   Not on file   Social Determinants of Health   Financial Resource Strain: Not on file  Food Insecurity: No Food Insecurity (06/02/2022)   Hunger Vital Sign    Worried About Running Out of Food in the Last Year: Never true    Ran Out of Food in the Last Year: Never true  Transportation  Needs: No Transportation Needs (06/02/2022)   PRAPARE - Hydrologist (Medical): No    Lack of Transportation (Non-Medical): No  Physical Activity: Not on file  Stress: Not on file  Social Connections: Not on file  Intimate Partner Violence: Not on file    Review of Systems   General: Negative for anorexia, weight loss, fever, chills, fatigue, weakness. Eyes: Negative for vision changes.  ENT: Negative for hoarseness, difficulty swallowing , nasal congestion. CV: Negative for chest pain, angina, palpitations, dyspnea on exertion, peripheral edema.  Respiratory: Negative for dyspnea at rest, dyspnea on exertion, cough, sputum, wheezing.  GI: See history of present illness. GU:  Negative for dysuria, hematuria, urinary incontinence, urinary frequency, nocturnal urination.  MS: Negative for joint pain, low back pain.  Derm: Negative for rash or itching.  Neuro: Negative for weakness, abnormal sensation, seizure, frequent headaches, memory loss,  confusion.  Psych: Negative for anxiety, depression, suicidal ideation, hallucinations.  Endo: Negative for unusual weight change.  Heme: Negative for bruising or bleeding. Allergy: Negative for rash or hives.  Physical Exam   BP 139/66 (BP Location: Right Arm, Patient Position: Sitting, Cuff Size: Normal)   Pulse (!) 102   Temp 98.1 F (36.7 C) (Oral)   Ht _0  (1.651 m)   Wt 146 lb (66.2 kg)   SpO2 97%   BMI 24.30 kg/m    General: Well-nourished, well-developed in no acute distress.  Head: Normocephalic, atraumatic.   Eyes: Conjunctiva pink, no icterus. Mouth: Oropharyngeal mucosa moist and pink , no lesions erythema or exudate. Neck: Supple without thyromegaly, masses, or lymphadenopathy.  Lungs: Clear to auscultation bilaterally.  Heart: Regular rate and rhythm, no murmurs rubs or gallops.  Abdomen: Bowel sounds are normal, nontender, nondistended, no hepatosplenomegaly or masses,  no abdominal  bruits or hernia, no rebound or guarding.   Rectal: *** Extremities: No lower extremity edema. No clubbing or deformities.  Neuro: Alert and oriented x 4 , grossly normal neurologically.  Skin: Warm and dry, no rash or jaundice.   Psych: Alert and cooperative, normal mood and affect.  Labs   *** Imaging Studies   No results found.  Assessment       PLAN   *** IR wants tube out. No pain. No swelling.  Stool studies.  Pantoprazole  Laureen Ochs. Bobby Rumpf, [place], [facility]

## 2022-06-30 NOTE — Patient Instructions (Addendum)
I will discuss with interventional radiology regarding removal of your gallbladder tube. Further recommendations to follow.  Please go by the xray department today at [facility] for abdominal xray.  Stool test to rule out infection.  Please update labs next week with your PCP. I have given orders today.  I will look over all of your [facility] records as discussed, I want to make sure I did not overlook a pancreas biopsy.  Try Zenpep, two capsule with first bite of food and 1 capsule with first bite of snack. I have provided samples today. If you find helpful with regards to your loose stool, we can send in a prescription.  If you have further questions, please call my CMA, Tammy directly at [phone] and ask to speak to me. You may have to leave a message but we will return your call.

## 2022-07-03 ENCOUNTER — Ambulatory Visit (INDEPENDENT_AMBULATORY_CARE_PROVIDER_SITE_OTHER): Payer: Medicare Other | Admitting: Family Medicine

## 2022-07-03 ENCOUNTER — Encounter: Payer: Self-pay | Admitting: Family Medicine

## 2022-07-03 ENCOUNTER — Telehealth: Payer: Self-pay

## 2022-07-03 VITALS — BP 128/80 | HR 87 | Temp 97.6°F | Ht 65.0 in | Wt 145.0 lb

## 2022-07-03 DIAGNOSIS — R197 Diarrhea, unspecified: Secondary | ICD-10-CM

## 2022-07-03 DIAGNOSIS — K8689 Other specified diseases of pancreas: Secondary | ICD-10-CM | POA: Diagnosis not present

## 2022-07-03 DIAGNOSIS — D649 Anemia, unspecified: Secondary | ICD-10-CM

## 2022-07-03 LAB — CBC WITH DIFFERENTIAL/PLATELET
Absolute Monocytes: 1037 cells/uL — ABNORMAL HIGH (ref 200–950)
Basophils Absolute: 38 cells/uL (ref 0–200)
Basophils Relative: 0.3 %
Eosinophils Absolute: 154 cells/uL (ref 15–500)
Eosinophils Relative: 1.2 %
HCT: 35.7 % (ref 35.0–45.0)
Hemoglobin: 11.3 g/dL — ABNORMAL LOW (ref 11.7–15.5)
Lymphs Abs: 2586 cells/uL (ref 850–3900)
MCH: 28.8 pg (ref 27.0–33.0)
MCHC: 31.7 g/dL — ABNORMAL LOW (ref 32.0–36.0)
MCV: 91.1 fL (ref 80.0–100.0)
MPV: 9.9 fL (ref 7.5–12.5)
Monocytes Relative: 8.1 %
Neutro Abs: 8986 cells/uL — ABNORMAL HIGH (ref 1500–7800)
Neutrophils Relative %: 70.2 %
Platelets: 353 10*3/uL (ref 140–400)
RBC: 3.92 10*6/uL (ref 3.80–5.10)
RDW: 16.8 % — ABNORMAL HIGH (ref 11.0–15.0)
Total Lymphocyte: 20.2 %
WBC: 12.8 10*3/uL — ABNORMAL HIGH (ref 3.8–10.8)

## 2022-07-03 LAB — COMPLETE METABOLIC PANEL WITH GFR
AG Ratio: 0.8 (calc) — ABNORMAL LOW (ref 1.0–2.5)
ALT: 8 U/L (ref 6–29)
AST: 12 U/L (ref 10–35)
Albumin: 3 g/dL — ABNORMAL LOW (ref 3.6–5.1)
Alkaline phosphatase (APISO): 192 U/L — ABNORMAL HIGH (ref 37–153)
BUN/Creatinine Ratio: 15 (calc) (ref 6–22)
BUN: 25 mg/dL (ref 7–25)
CO2: 22 mmol/L (ref 20–32)
Calcium: 8.4 mg/dL — ABNORMAL LOW (ref 8.6–10.4)
Chloride: 106 mmol/L (ref 98–110)
Creat: 1.68 mg/dL — ABNORMAL HIGH (ref 0.60–0.95)
Globulin: 3.6 g/dL (calc) (ref 1.9–3.7)
Glucose, Bld: 113 mg/dL — ABNORMAL HIGH (ref 65–99)
Potassium: 3.7 mmol/L (ref 3.5–5.3)
Sodium: 138 mmol/L (ref 135–146)
Total Bilirubin: 1.4 mg/dL — ABNORMAL HIGH (ref 0.2–1.2)
Total Protein: 6.6 g/dL (ref 6.1–8.1)
eGFR: 30 mL/min/{1.73_m2} — ABNORMAL LOW (ref >=60)

## 2022-07-03 NOTE — Progress Notes (Signed)
Acute Office Visit  Subjective:     Patient ID: Julie Baxter, female    DOB: 1935-09-09, 86 y.o.   MRN: [id number]  Chief Complaint  Patient presents with   Follow-up    Lab work    HPI Patient is in today for follow-up and labs. She has recently seen gastroenterology and requests a referral to IR for removal of PCT drain. Drain output has remained bilious and decreased in amount (0-68m when emptied 1-2x daily). She was prescribed Zenpep for bowel frequency and urgency that resolved without having ever started the Zenpep. Currently she is having  She reports feeling very well and improving remarkably. She would like to recheck her labs. She remains consistent that she does not want to have a biopsy of her mass and would not pursue interventions upon a confirmed cancer diagnosis.  Review of Systems  Constitutional: Negative.   HENT: Negative.    Eyes: Negative.   Respiratory: Negative.    Cardiovascular:  Positive for leg swelling (improving from prior).  Gastrointestinal: Negative.   Genitourinary: Negative.   Musculoskeletal: Negative.   Skin: Negative.   Neurological: Negative.   Endo/Heme/Allergies: Negative.   Psychiatric/Behavioral: Negative.    All other systems reviewed and are negative.       Objective:    BP 128/80   Pulse 87   Temp 97.6 F (36.4 C) (Oral)   Ht '5\' 5"'$  (1.651 m)   Wt 145 lb (65.8 kg)   SpO2 97%   BMI 24.13 kg/m  Wt Readings from Last 3 Encounters:  07/03/22 145 lb (65.8 kg)  06/30/22 146 lb (66.2 kg)  06/13/22 152 lb (68.9 kg)      Physical Exam Vitals and nursing note reviewed.  Constitutional:      Appearance: Normal appearance. She is normal weight.  HENT:     Head: Normocephalic and atraumatic.  Cardiovascular:     Rate and Rhythm: Normal rate and regular rhythm.     Pulses: Normal pulses.     Heart sounds: Normal heart sounds.  Pulmonary:     Effort: Pulmonary effort is normal.     Breath sounds: Normal breath  sounds.  Abdominal:     General: Abdomen is flat. Bowel sounds are normal. There is no distension.     Palpations: Abdomen is soft. There is no mass.     Tenderness: There is no abdominal tenderness.     Comments: PCT drain remains in place, surrounding skin clean, dry, intact  Musculoskeletal:     Right lower leg: Edema (nonpitting) present.     Left lower leg: Edema (nonpitting) present.  Skin:    General: Skin is warm and dry.  Neurological:     General: No focal deficit present.     Mental Status: She is alert and oriented to person, place, and time. Mental status is at baseline.  Psychiatric:        Mood and Affect: Mood normal.        Behavior: Behavior normal.        Thought Content: Thought content normal.        Judgment: Judgment normal.     No results found for any visits on 07/03/22.      Assessment & Plan:   1. Pancreatic mass Patient reports feeling remarkably well and overall improving. She maintains that she would not like to pursue biopsy or intervention for pancreatic mass but is interested in having her PCT drain removed. Referral placed for  IR consultation. Labs completed today at gastroenterology request. Follow up in 6 months per patient request or sooner if needs arise as she would like to minimize medical visits. - Ambulatory referral to Interventional Radiology - COMPLETE METABOLIC PANEL WITH GFR - CBC with Differential/Platelet  No orders of the defined types were placed in this encounter.   Return in about 6 months (around 01/01/2023).  Rubie Maid, FNP

## 2022-07-03 NOTE — Telephone Encounter (Signed)
Pt called stating that the diarrhea stopped on its on and that she didn't have to start the zenpep. Pt states that it stopped the same day she was seen and hasn't had any since.

## 2022-07-04 ENCOUNTER — Encounter: Payer: Self-pay | Admitting: Gastroenterology

## 2022-07-04 DIAGNOSIS — D649 Anemia, unspecified: Secondary | ICD-10-CM | POA: Insufficient documentation

## 2022-07-06 ENCOUNTER — Other Ambulatory Visit: Payer: Self-pay | Admitting: Gastroenterology

## 2022-07-06 DIAGNOSIS — K8309 Other cholangitis: Secondary | ICD-10-CM

## 2022-07-07 ENCOUNTER — Other Ambulatory Visit (HOSPITAL_COMMUNITY): Payer: Self-pay | Admitting: Interventional Radiology

## 2022-07-07 ENCOUNTER — Other Ambulatory Visit: Payer: Self-pay | Admitting: Interventional Radiology

## 2022-07-07 DIAGNOSIS — K8309 Other cholangitis: Secondary | ICD-10-CM

## 2022-07-10 ENCOUNTER — Ambulatory Visit (HOSPITAL_COMMUNITY): Payer: Medicare Other

## 2022-07-11 ENCOUNTER — Ambulatory Visit: Payer: Medicare Other | Admitting: Gastroenterology

## 2022-07-13 ENCOUNTER — Other Ambulatory Visit (HOSPITAL_COMMUNITY): Payer: Self-pay | Admitting: Interventional Radiology

## 2022-07-13 ENCOUNTER — Ambulatory Visit (HOSPITAL_COMMUNITY)
Admission: RE | Admit: 2022-07-13 | Discharge: 2022-07-13 | Disposition: A | Discharge status: Home / Self Care | Payer: Medicare Other | Source: Ambulatory Visit | Attending: Gastroenterology | Admitting: Gastroenterology

## 2022-07-13 DIAGNOSIS — Z434 Encounter for attention to other artificial openings of digestive tract: Secondary | ICD-10-CM | POA: Diagnosis not present

## 2022-07-13 DIAGNOSIS — K8309 Other cholangitis: Secondary | ICD-10-CM

## 2022-07-13 DIAGNOSIS — K8689 Other specified diseases of pancreas: Secondary | ICD-10-CM | POA: Diagnosis not present

## 2022-07-13 DIAGNOSIS — K7689 Other specified diseases of liver: Secondary | ICD-10-CM | POA: Diagnosis not present

## 2022-07-13 HISTORY — PX: IR EXCHANGE BILIARY DRAIN: IMG6046

## 2022-07-13 MED ORDER — IOHEXOL 350 MG/ML SOLN
75.0000 mL | Freq: Once | INTRAVENOUS | Status: AC | PRN
  Administered 2022-07-13: 75 mL via INTRAVENOUS

## 2022-07-13 MED ORDER — LIDOCAINE HCL 1 % IJ SOLN
INTRAMUSCULAR | Status: AC
  Filled 2022-07-13: qty 20

## 2022-07-13 MED ORDER — IOHEXOL 300 MG/ML  SOLN
50.0000 mL | Freq: Once | INTRAMUSCULAR | Status: AC | PRN
  Administered 2022-07-13: 20 mL

## 2022-07-13 NOTE — Procedures (Signed)
Vascular and Interventional Radiology Procedure Note  Patient: Julie Baxter DOB: 1935-09-27 Medical Record Number: [id number] Note Date/Time: 07/13/22 2:50 PM   Performing Physician: Michaelle Birks, MD Assistant(s): None  Diagnosis: Hx of cholecystitis. Chole drain placed at OSH ([facility]). Biliary obstruction at pancreas.  Procedure:  ANTEROGRADE CHOLANGIOGRAM CHOLECYSTOSTOMY TUBE EXCHANGE   Anesthesia: Local Anesthetic Complications: None Estimated Blood Loss:  0 mL Specimens:  None  Findings:  Persistent cystic duct obstruction. Successful exchange and upsize to a 77F cholecystostomy tube.   Plan: Flush tube w 10 mL sterile NS and record drain output qShift. Follow up for routine tube evaluation in 8 week(s).   See detailed procedure note with images in PACS. The patient tolerated the procedure well without incident or complication and was returned to [facility] in stable condition.    Michaelle Birks, MD [facility]   Pager. [place]

## 2022-07-14 ENCOUNTER — Other Ambulatory Visit: Payer: Self-pay

## 2022-07-14 ENCOUNTER — Other Ambulatory Visit: Payer: Self-pay | Admitting: Family Medicine

## 2022-07-14 DIAGNOSIS — K8689 Other specified diseases of pancreas: Secondary | ICD-10-CM

## 2022-07-14 DIAGNOSIS — Z0000 Encounter for general adult medical examination without abnormal findings: Secondary | ICD-10-CM

## 2022-07-14 NOTE — Telephone Encounter (Signed)
Requested medication (s) are due for refill today: Yes  Requested medication (s) are on the active medication list: No  Last refill:  Unknown  Future visit scheduled: Yes  Notes to clinic:  Unable to refill per protocol, medication not assigned to the refill protocol.      Requested Prescriptions  Pending Prescriptions Disp Refills   BD POSIFLUSH 0.9 % SOLN injection [Pharmacy Med Name: NORMAL SALINE FLUSH SYRINGE] 300 mL 0    Sig: USE AS DIRECTED.     Off-Protocol Failed - 07/14/2022  9:46 AM      Failed - Medication not assigned to a protocol, review manually.      Failed - Valid encounter within last 12 months    Recent Outpatient Visits           6 years ago Torticollis, acute   [facility] at [facility], MD

## 2022-07-24 ENCOUNTER — Other Ambulatory Visit: Payer: Self-pay | Admitting: Nurse Practitioner

## 2022-07-24 ENCOUNTER — Other Ambulatory Visit: Payer: Self-pay | Admitting: Family Medicine

## 2022-08-02 NOTE — Telephone Encounter (Signed)
Dena, please mail lab orders and D/C summary to patient.

## 2022-08-02 NOTE — Addendum Note (Signed)
Addended by: Mahala Menghini on: 08/02/2022 12:47 PM   Modules accepted: Orders

## 2022-08-02 NOTE — Addendum Note (Signed)
Addended by: Mahala Menghini on: 08/02/2022 12:55 PM   Modules accepted: Orders

## 2022-08-02 NOTE — Telephone Encounter (Signed)
Noted Put in the mail

## 2022-08-02 NOTE — Telephone Encounter (Signed)
Spoke to patient at length today.   She has been seeing IR for her cholecystostomy tube. Had an exchange 07/13/22. She goes back in 08/2022 for repeat evaluation and possibly to have it removed if it is ready.  She has been having some issues with her BP being low. Eating sodium to keep it up. Denies fever. No n/v. Intermittent diarrhea but she was afraid to take Zenpep. Encouraged her to complete stool test previously ordered or try Zenpep.  Explained to patient in depth that her CT shows that pancreatic duct stent did not pass spontaneously. She should consider having it removed. Typically pancreatic duct stents are not left in chronically. She also has biliary duct stent that should be exchanged by ERCP at 3 months (08/2022). Could have pancreatic duct stent removed at the same time. I have advised that she contact [facility] to set this up.   In the meantime, we will update her labs to make sure bile duct is draining well.   I will have discharge summary from [facility] mailed to patient with phone numbers needed at her request.   Forwarding this message to Mila Merry, FNP for continuity of care

## 2022-08-03 DIAGNOSIS — R197 Diarrhea, unspecified: Secondary | ICD-10-CM | POA: Diagnosis not present

## 2022-08-03 DIAGNOSIS — K8689 Other specified diseases of pancreas: Secondary | ICD-10-CM | POA: Diagnosis not present

## 2022-08-03 DIAGNOSIS — D649 Anemia, unspecified: Secondary | ICD-10-CM | POA: Diagnosis not present

## 2022-08-04 ENCOUNTER — Telehealth: Payer: Self-pay

## 2022-08-04 DIAGNOSIS — K8689 Other specified diseases of pancreas: Secondary | ICD-10-CM

## 2022-08-04 LAB — COMPREHENSIVE METABOLIC PANEL
ALT: 50 IU/L — ABNORMAL HIGH (ref 0–32)
AST: 47 IU/L — ABNORMAL HIGH (ref 0–40)
Albumin/Globulin Ratio: 0.9 — ABNORMAL LOW (ref 1.2–2.2)
Albumin: 3 g/dL — ABNORMAL LOW (ref 3.7–4.7)
Alkaline Phosphatase: 936 IU/L — ABNORMAL HIGH (ref 44–121)
BUN/Creatinine Ratio: 9 — ABNORMAL LOW (ref 12–28)
BUN: 21 mg/dL (ref 8–27)
Bilirubin Total: 0.6 mg/dL (ref 0.0–1.2)
CO2: 15 mmol/L — ABNORMAL LOW (ref 20–29)
Calcium: 8.1 mg/dL — ABNORMAL LOW (ref 8.7–10.3)
Chloride: 102 mmol/L (ref 96–106)
Creatinine, Ser: 2.28 mg/dL — ABNORMAL HIGH (ref 0.57–1.00)
Globulin, Total: 3.3 g/dL (ref 1.5–4.5)
Glucose: 285 mg/dL — ABNORMAL HIGH (ref 70–99)
Potassium: 5 mmol/L (ref 3.5–5.2)
Sodium: 132 mmol/L — ABNORMAL LOW (ref 134–144)
Total Protein: 6.3 g/dL (ref 6.0–8.5)
eGFR: 21 mL/min/{1.73_m2} — ABNORMAL LOW (ref >59)

## 2022-08-04 LAB — CBC WITH DIFFERENTIAL/PLATELET
Basophils Absolute: 0 10*3/uL (ref 0.0–0.2)
Basos: 0 %
EOS (ABSOLUTE): 0.2 10*3/uL (ref 0.0–0.4)
Eos: 2 %
Hematocrit: 32.7 % — ABNORMAL LOW (ref 34.0–46.6)
Hemoglobin: 10.9 g/dL — ABNORMAL LOW (ref 11.1–15.9)
Immature Grans (Abs): 0.1 10*3/uL (ref 0.0–0.1)
Immature Granulocytes: 1 %
Lymphocytes Absolute: 2.3 10*3/uL (ref 0.7–3.1)
Lymphs: 23 %
MCH: 29.6 pg (ref 26.6–33.0)
MCHC: 33.3 g/dL (ref 31.5–35.7)
MCV: 89 fL (ref 79–97)
Monocytes Absolute: 1.2 10*3/uL — ABNORMAL HIGH (ref 0.1–0.9)
Monocytes: 12 %
Neutrophils Absolute: 6.3 10*3/uL (ref 1.4–7.0)
Neutrophils: 62 %
Platelets: 360 10*3/uL (ref 150–450)
RBC: 3.68 x10E6/uL — ABNORMAL LOW (ref 3.77–5.28)
RDW: 14.8 % (ref 11.7–15.4)
WBC: 10.1 10*3/uL (ref 3.4–10.8)

## 2022-08-04 LAB — HEMOGLOBIN A1C
Est. average glucose Bld gHb Est-mCnc: 214 mg/dL
Hgb A1c MFr Bld: 9.1 % — ABNORMAL HIGH (ref 4.8–5.6)

## 2022-08-04 NOTE — Telephone Encounter (Signed)
Tammy, see result note. Mindy, need asap [facility] GI referral see result note

## 2022-08-04 NOTE — Telephone Encounter (Signed)
Pt called wanting to know the results to her recent labs. Informed pt that she may not hear about them today. Pt verbalized understanding.

## 2022-08-04 NOTE — Addendum Note (Signed)
Addended by: Cheron Every on: 08/04/2022 11:01 AM   Modules accepted: Orders

## 2022-08-09 ENCOUNTER — Telehealth: Payer: Self-pay | Admitting: Family Medicine

## 2022-08-09 NOTE — Telephone Encounter (Signed)
Patient would like a referral for a removal of pancreatic stent done at [facility] she had this done at [facility] in September 2023. She states that the GI doctor told her that they had put in the stent and that it was supposed to be removed in two weeks after they placed it she thinks this might have to be done in radiology. She would like this stent removed at [facility] instead of going back to [facility].   CB# [phone]

## 2022-08-11 ENCOUNTER — Ambulatory Visit: Payer: Medicare Other | Admitting: Nurse Practitioner

## 2022-08-12 DIAGNOSIS — E871 Hypo-osmolality and hyponatremia: Secondary | ICD-10-CM | POA: Diagnosis not present

## 2022-08-12 DIAGNOSIS — R809 Proteinuria, unspecified: Secondary | ICD-10-CM | POA: Diagnosis not present

## 2022-08-12 DIAGNOSIS — E1129 Type 2 diabetes mellitus with other diabetic kidney complication: Secondary | ICD-10-CM | POA: Diagnosis not present

## 2022-08-12 DIAGNOSIS — E8722 Chronic metabolic acidosis: Secondary | ICD-10-CM | POA: Diagnosis not present

## 2022-08-12 DIAGNOSIS — K8689 Other specified diseases of pancreas: Secondary | ICD-10-CM | POA: Diagnosis not present

## 2022-08-12 DIAGNOSIS — N170 Acute kidney failure with tubular necrosis: Secondary | ICD-10-CM | POA: Diagnosis not present

## 2022-08-12 DIAGNOSIS — D631 Anemia in chronic kidney disease: Secondary | ICD-10-CM | POA: Diagnosis not present

## 2022-08-12 DIAGNOSIS — E1122 Type 2 diabetes mellitus with diabetic chronic kidney disease: Secondary | ICD-10-CM | POA: Diagnosis not present

## 2022-08-12 DIAGNOSIS — N189 Chronic kidney disease, unspecified: Secondary | ICD-10-CM | POA: Diagnosis not present

## 2022-08-16 NOTE — Telephone Encounter (Signed)
Tammy/Mindy/Ann: Can we check on status of [facility] referral need asap? The referral should be for "need for biliary stent exchange/rising LFTs, retained pancreatic duct stent with history of pancreatic mass".     For continuity of care, sharing with PCP, Mila Merry, [place].  Patient has three stents/tubes pertaining to her history of ruptured gallbladder, pancreatic mass. I have discussed this at length with patient but she continues to get confused regarding the cholecystostomy tube and the common bile duct stent (seems to believe this two are the same)  --->she has a cholecystotomy tube with external drainage bag, this is being managed by IR in [facility]  --->she has a plastic stent in her common bile duct (biliary stent), this was supposed to be changed by [facility] via ERCP in 3 months. Recent labs indicate the stent may not be draining well as noted by rising alk phos. I have placed referral to [facility] but no appointment set to date. I spoke to patient extensively about the need to have this done to avoid infection (cholangitis). This cannot be done by our group as we do not perform ERCPs and she should have this done at [facility].   -she has a pancreatic duct stent that typically passes spontaneously in 2-3 weeks but as noted by CT 07/13/22 stent still present. This could be removed at the same time ERCP with biliary stent exchange is completed.

## 2022-08-17 NOTE — Telephone Encounter (Signed)
Pt has been scheduled for 09/14/22 at 10:15 am with Roseanne Reno, PA-C

## 2022-08-24 ENCOUNTER — Telehealth: Payer: Self-pay

## 2022-08-24 NOTE — Telephone Encounter (Signed)
Pt is requesting an Rx for Zenpep to be sent to [facility] on [address]. Pt states that the samples seem to be helping her.

## 2022-08-25 MED ORDER — ZENPEP 40000-126000 UNITS PO CPEP: ORAL_CAPSULE | ORAL | 11 refills | Status: DC

## 2022-08-25 NOTE — Addendum Note (Signed)
Addended by: Mahala Menghini on: 08/25/2022 01:37 PM   Modules accepted: Orders

## 2022-08-25 NOTE — Telephone Encounter (Signed)
Noted.   Please ask patient to update LFTs since waiting few more weeks to be seen at [facility]. Dx: biliary stent, elevated LFTs.

## 2022-08-29 NOTE — Telephone Encounter (Signed)
Pt states that they moved her appt up and she will see them on Monday, pt is wanting to hold off on the lab for now.

## 2022-08-30 NOTE — Telephone Encounter (Signed)
Noted. Thanks. This is good news.

## 2022-09-01 ENCOUNTER — Inpatient Hospital Stay (HOSPITAL_COMMUNITY)
Admission: EM | Admit: 2022-09-01 | Discharge: 2022-09-03 | DRG: 919 | Disposition: A | Discharge status: Home / Self Care | Payer: Medicare Other | Attending: Internal Medicine | Admitting: Internal Medicine

## 2022-09-01 ENCOUNTER — Other Ambulatory Visit: Payer: Self-pay

## 2022-09-01 ENCOUNTER — Encounter (HOSPITAL_COMMUNITY): Payer: Self-pay | Admitting: Emergency Medicine

## 2022-09-01 ENCOUNTER — Emergency Department (HOSPITAL_COMMUNITY): Payer: Medicare Other

## 2022-09-01 DIAGNOSIS — N184 Chronic kidney disease, stage 4 (severe): Secondary | ICD-10-CM | POA: Diagnosis present

## 2022-09-01 DIAGNOSIS — Z886 Allergy status to analgesic agent status: Secondary | ICD-10-CM

## 2022-09-01 DIAGNOSIS — A419 Sepsis, unspecified organism: Secondary | ICD-10-CM | POA: Diagnosis not present

## 2022-09-01 DIAGNOSIS — Z7984 Long term (current) use of oral hypoglycemic drugs: Secondary | ICD-10-CM

## 2022-09-01 DIAGNOSIS — I959 Hypotension, unspecified: Secondary | ICD-10-CM | POA: Diagnosis not present

## 2022-09-01 DIAGNOSIS — E11649 Type 2 diabetes mellitus with hypoglycemia without coma: Secondary | ICD-10-CM | POA: Diagnosis present

## 2022-09-01 DIAGNOSIS — Z888 Allergy status to other drugs, medicaments and biological substances status: Secondary | ICD-10-CM

## 2022-09-01 DIAGNOSIS — K8309 Other cholangitis: Secondary | ICD-10-CM | POA: Diagnosis not present

## 2022-09-01 DIAGNOSIS — L89311 Pressure ulcer of right buttock, stage 1: Secondary | ICD-10-CM | POA: Diagnosis not present

## 2022-09-01 DIAGNOSIS — N179 Acute kidney failure, unspecified: Secondary | ICD-10-CM | POA: Diagnosis not present

## 2022-09-01 DIAGNOSIS — I129 Hypertensive chronic kidney disease with stage 1 through stage 4 chronic kidney disease, or unspecified chronic kidney disease: Secondary | ICD-10-CM | POA: Diagnosis present

## 2022-09-01 DIAGNOSIS — Y831 Surgical operation with implant of artificial internal device as the cause of abnormal reaction of the patient, or of later complication, without mention of misadventure at the time of the procedure: Secondary | ICD-10-CM | POA: Diagnosis present

## 2022-09-01 DIAGNOSIS — Z96643 Presence of artificial hip joint, bilateral: Secondary | ICD-10-CM | POA: Diagnosis present

## 2022-09-01 DIAGNOSIS — E872 Acidosis, unspecified: Secondary | ICD-10-CM | POA: Diagnosis not present

## 2022-09-01 DIAGNOSIS — R338 Other retention of urine: Secondary | ICD-10-CM | POA: Diagnosis not present

## 2022-09-01 DIAGNOSIS — I10 Essential (primary) hypertension: Secondary | ICD-10-CM | POA: Diagnosis not present

## 2022-09-01 DIAGNOSIS — R42 Dizziness and giddiness: Secondary | ICD-10-CM | POA: Diagnosis not present

## 2022-09-01 DIAGNOSIS — R7401 Elevation of levels of liver transaminase levels: Secondary | ICD-10-CM | POA: Diagnosis present

## 2022-09-01 DIAGNOSIS — E1122 Type 2 diabetes mellitus with diabetic chronic kidney disease: Secondary | ICD-10-CM | POA: Diagnosis present

## 2022-09-01 DIAGNOSIS — R112 Nausea with vomiting, unspecified: Secondary | ICD-10-CM | POA: Diagnosis not present

## 2022-09-01 DIAGNOSIS — E113553 Type 2 diabetes mellitus with stable proliferative diabetic retinopathy, bilateral: Secondary | ICD-10-CM | POA: Diagnosis not present

## 2022-09-01 DIAGNOSIS — Z1152 Encounter for screening for COVID-19: Secondary | ICD-10-CM | POA: Diagnosis not present

## 2022-09-01 DIAGNOSIS — R7989 Other specified abnormal findings of blood chemistry: Secondary | ICD-10-CM

## 2022-09-01 DIAGNOSIS — Z66 Do not resuscitate: Secondary | ICD-10-CM | POA: Diagnosis present

## 2022-09-01 DIAGNOSIS — J9811 Atelectasis: Secondary | ICD-10-CM | POA: Diagnosis not present

## 2022-09-01 DIAGNOSIS — K8689 Other specified diseases of pancreas: Secondary | ICD-10-CM | POA: Diagnosis present

## 2022-09-01 DIAGNOSIS — E1151 Type 2 diabetes mellitus with diabetic peripheral angiopathy without gangrene: Secondary | ICD-10-CM | POA: Diagnosis present

## 2022-09-01 DIAGNOSIS — E1165 Type 2 diabetes mellitus with hyperglycemia: Secondary | ICD-10-CM | POA: Diagnosis not present

## 2022-09-01 DIAGNOSIS — E871 Hypo-osmolality and hyponatremia: Secondary | ICD-10-CM | POA: Diagnosis not present

## 2022-09-01 DIAGNOSIS — R0602 Shortness of breath: Secondary | ICD-10-CM | POA: Diagnosis not present

## 2022-09-01 DIAGNOSIS — K828 Other specified diseases of gallbladder: Secondary | ICD-10-CM | POA: Diagnosis not present

## 2022-09-01 DIAGNOSIS — K869 Disease of pancreas, unspecified: Secondary | ICD-10-CM | POA: Diagnosis present

## 2022-09-01 DIAGNOSIS — E8809 Other disorders of plasma-protein metabolism, not elsewhere classified: Secondary | ICD-10-CM | POA: Diagnosis present

## 2022-09-01 DIAGNOSIS — E782 Mixed hyperlipidemia: Secondary | ICD-10-CM | POA: Diagnosis present

## 2022-09-01 DIAGNOSIS — L8990 Pressure ulcer of unspecified site, unspecified stage: Secondary | ICD-10-CM | POA: Insufficient documentation

## 2022-09-01 DIAGNOSIS — Z8661 Personal history of infections of the central nervous system: Secondary | ICD-10-CM

## 2022-09-01 DIAGNOSIS — F411 Generalized anxiety disorder: Secondary | ICD-10-CM | POA: Diagnosis present

## 2022-09-01 DIAGNOSIS — I700 Atherosclerosis of aorta: Secondary | ICD-10-CM | POA: Diagnosis not present

## 2022-09-01 DIAGNOSIS — I951 Orthostatic hypotension: Secondary | ICD-10-CM | POA: Diagnosis not present

## 2022-09-01 DIAGNOSIS — R339 Retention of urine, unspecified: Secondary | ICD-10-CM | POA: Diagnosis present

## 2022-09-01 DIAGNOSIS — E876 Hypokalemia: Secondary | ICD-10-CM

## 2022-09-01 DIAGNOSIS — Z794 Long term (current) use of insulin: Secondary | ICD-10-CM | POA: Diagnosis not present

## 2022-09-01 DIAGNOSIS — R6520 Severe sepsis without septic shock: Secondary | ICD-10-CM | POA: Diagnosis present

## 2022-09-01 DIAGNOSIS — T8579XA Infection and inflammatory reaction due to other internal prosthetic devices, implants and grafts, initial encounter: Principal | ICD-10-CM | POA: Diagnosis present

## 2022-09-01 DIAGNOSIS — Z885 Allergy status to narcotic agent status: Secondary | ICD-10-CM

## 2022-09-01 DIAGNOSIS — E1121 Type 2 diabetes mellitus with diabetic nephropathy: Secondary | ICD-10-CM | POA: Diagnosis not present

## 2022-09-01 DIAGNOSIS — E860 Dehydration: Secondary | ICD-10-CM

## 2022-09-01 DIAGNOSIS — Z79899 Other long term (current) drug therapy: Secondary | ICD-10-CM

## 2022-09-01 DIAGNOSIS — R000 Tachycardia, unspecified: Secondary | ICD-10-CM | POA: Diagnosis not present

## 2022-09-01 LAB — COMPREHENSIVE METABOLIC PANEL
ALT: 111 U/L — ABNORMAL HIGH (ref 0–44)
AST: 69 U/L — ABNORMAL HIGH (ref 15–41)
Albumin: 2 g/dL — ABNORMAL LOW (ref 3.5–5.0)
Alkaline Phosphatase: 1040 U/L — ABNORMAL HIGH (ref 38–126)
Anion gap: 15 (ref 5–15)
BUN: 41 mg/dL — ABNORMAL HIGH (ref 8–23)
CO2: 12 mmol/L — ABNORMAL LOW (ref 22–32)
Calcium: 7.7 mg/dL — ABNORMAL LOW (ref 8.9–10.3)
Chloride: 102 mmol/L (ref 98–111)
Creatinine, Ser: 2.93 mg/dL — ABNORMAL HIGH (ref 0.44–1.00)
GFR, Estimated: 15 mL/min — ABNORMAL LOW (ref >60)
Glucose, Bld: 148 mg/dL — ABNORMAL HIGH (ref 70–99)
Potassium: 3.4 mmol/L — ABNORMAL LOW (ref 3.5–5.1)
Sodium: 129 mmol/L — ABNORMAL LOW (ref 135–145)
Total Bilirubin: 3.1 mg/dL — ABNORMAL HIGH (ref 0.3–1.2)
Total Protein: 5.9 g/dL — ABNORMAL LOW (ref 6.5–8.1)

## 2022-09-01 LAB — MRSA NEXT GEN BY PCR, NASAL: MRSA by PCR Next Gen: NOT DETECTED

## 2022-09-01 LAB — LACTIC ACID, PLASMA
Lactic Acid, Venous: 2.4 mmol/L (ref 0.5–1.9)
Lactic Acid, Venous: 3.6 mmol/L (ref 0.5–1.9)
Lactic Acid, Venous: 2.4 mmol/L (ref 0.5–1.9)

## 2022-09-01 LAB — URINALYSIS, ROUTINE W REFLEX MICROSCOPIC
Bilirubin Urine: NEGATIVE
Glucose, UA: 150 mg/dL — AB
Ketones, ur: NEGATIVE mg/dL
Nitrite: NEGATIVE
Protein, ur: NEGATIVE mg/dL
Specific Gravity, Urine: 1.011 (ref 1.005–1.030)
pH: 5 (ref 5.0–8.0)

## 2022-09-01 LAB — GLUCOSE, CAPILLARY
Glucose-Capillary: 130 mg/dL — ABNORMAL HIGH (ref 70–99)
Glucose-Capillary: 92 mg/dL (ref 70–99)

## 2022-09-01 LAB — CBC WITH DIFFERENTIAL/PLATELET
Abs Immature Granulocytes: 0.5 10*3/uL — ABNORMAL HIGH (ref 0.00–0.07)
Band Neutrophils: 2 %
Basophils Absolute: 0 10*3/uL (ref 0.0–0.1)
Basophils Relative: 0 %
Eosinophils Absolute: 0 10*3/uL (ref 0.0–0.5)
Eosinophils Relative: 0 %
HCT: 31.8 % — ABNORMAL LOW (ref 36.0–46.0)
Hemoglobin: 10.5 g/dL — ABNORMAL LOW (ref 12.0–15.0)
Lymphocytes Relative: 1 %
Lymphs Abs: 0.5 10*3/uL — ABNORMAL LOW (ref 0.7–4.0)
MCH: 29.8 pg (ref 26.0–34.0)
MCHC: 33 g/dL (ref 30.0–36.0)
MCV: 90.3 fL (ref 80.0–100.0)
Metamyelocytes Relative: 1 %
Monocytes Absolute: 2.1 10*3/uL — ABNORMAL HIGH (ref 0.1–1.0)
Monocytes Relative: 4 %
Neutro Abs: 49.4 10*3/uL — ABNORMAL HIGH (ref 1.7–7.7)
Neutrophils Relative %: 92 %
Platelets: 216 10*3/uL (ref 150–400)
RBC: 3.52 MIL/uL — ABNORMAL LOW (ref 3.87–5.11)
RDW: 15.8 % — ABNORMAL HIGH (ref 11.5–15.5)
Smear Review: ADEQUATE
WBC: 52.6 10*3/uL (ref 4.0–10.5)
nRBC: 0 % (ref 0.0–0.2)

## 2022-09-01 LAB — RESP PANEL BY RT-PCR (RSV, FLU A&B, COVID)  RVPGX2
Influenza A by PCR: NEGATIVE
Influenza B by PCR: NEGATIVE
Resp Syncytial Virus by PCR: NEGATIVE
SARS Coronavirus 2 by RT PCR: NEGATIVE

## 2022-09-01 LAB — LIPASE, BLOOD: Lipase: 26 U/L (ref 11–51)

## 2022-09-01 LAB — PROTIME-INR
INR: 1.5 — ABNORMAL HIGH (ref 0.8–1.2)
Prothrombin Time: 17.8 seconds — ABNORMAL HIGH (ref 11.4–15.2)

## 2022-09-01 MED ORDER — PROPYLENE GLYCOL 0.6 % OP SOLN: Freq: Every day | OPHTHALMIC | Status: DC | PRN

## 2022-09-01 MED ORDER — ONDANSETRON HCL 4 MG/2ML IJ SOLN
4.0000 mg | Freq: Four times a day (QID) | INTRAMUSCULAR | Status: DC | PRN
  Administered 2022-09-01: 4 mg via INTRAVENOUS
  Filled 2022-09-01: qty 2

## 2022-09-01 MED ORDER — SODIUM BICARBONATE 650 MG PO TABS
650.0000 mg | ORAL_TABLET | Freq: Three times a day (TID) | ORAL | Status: DC
  Administered 2022-09-01 – 2022-09-03 (×5): 650 mg via ORAL
  Filled 2022-09-01 (×5): qty 1

## 2022-09-01 MED ORDER — POTASSIUM CHLORIDE 10 MEQ/100ML IV SOLN
10.0000 meq | INTRAVENOUS | Status: AC
  Administered 2022-09-01 (×2): 10 meq via INTRAVENOUS
  Filled 2022-09-01 (×2): qty 100

## 2022-09-01 MED ORDER — INSULIN ASPART 100 UNIT/ML IJ SOLN
0.0000 [IU] | Freq: Three times a day (TID) | INTRAMUSCULAR | Status: DC
  Administered 2022-09-02 (×2): 3 [IU] via SUBCUTANEOUS
  Administered 2022-09-03: 2 [IU] via SUBCUTANEOUS

## 2022-09-01 MED ORDER — ACETAMINOPHEN 650 MG RE SUPP: 650.0000 mg | Freq: Four times a day (QID) | RECTAL | Status: DC | PRN

## 2022-09-01 MED ORDER — LACTATED RINGERS IV BOLUS
1000.0000 mL | Freq: Once | INTRAVENOUS | Status: AC
  Administered 2022-09-01: 1000 mL via INTRAVENOUS

## 2022-09-01 MED ORDER — LACTATED RINGERS IV BOLUS (SEPSIS)
1000.0000 mL | Freq: Once | INTRAVENOUS | Status: AC
  Administered 2022-09-01: 1000 mL via INTRAVENOUS

## 2022-09-01 MED ORDER — METRONIDAZOLE 500 MG/100ML IV SOLN
500.0000 mg | Freq: Once | INTRAVENOUS | Status: AC
  Administered 2022-09-01: 500 mg via INTRAVENOUS
  Filled 2022-09-01: qty 100

## 2022-09-01 MED ORDER — SODIUM CHLORIDE 0.9 % IV SOLN
2.0000 g | INTRAVENOUS | Status: DC
  Administered 2022-09-02 – 2022-09-03 (×2): 2 g via INTRAVENOUS
  Filled 2022-09-01 (×2): qty 12.5

## 2022-09-01 MED ORDER — SODIUM CHLORIDE FLUSH 0.9 % IV SOLN
10.0000 mL | Freq: Every day | INTRAVENOUS | Status: DC
  Administered 2022-09-01 – 2022-09-03 (×3): 10 mL
  Filled 2022-09-01 (×4): qty 10

## 2022-09-01 MED ORDER — VANCOMYCIN VARIABLE DOSE PER UNSTABLE RENAL FUNCTION (PHARMACIST DOSING): Status: DC

## 2022-09-01 MED ORDER — INSULIN GLARGINE-YFGN 100 UNIT/ML ~~LOC~~ SOLN
8.0000 [IU] | Freq: Every day | SUBCUTANEOUS | Status: DC
  Administered 2022-09-01: 8 [IU] via SUBCUTANEOUS
  Filled 2022-09-01 (×2): qty 0.08

## 2022-09-01 MED ORDER — VANCOMYCIN HCL 1250 MG/250ML IV SOLN
1250.0000 mg | Freq: Once | INTRAVENOUS | Status: AC
  Administered 2022-09-01: 1250 mg via INTRAVENOUS
  Filled 2022-09-01: qty 250

## 2022-09-01 MED ORDER — INSULIN ASPART 100 UNIT/ML IJ SOLN: 0.0000 [IU] | Freq: Every day | INTRAMUSCULAR | Status: DC

## 2022-09-01 MED ORDER — HEPARIN SODIUM (PORCINE) 5000 UNIT/ML IJ SOLN
5000.0000 [IU] | Freq: Three times a day (TID) | INTRAMUSCULAR | Status: DC
  Administered 2022-09-01 – 2022-09-03 (×6): 5000 [IU] via SUBCUTANEOUS
  Filled 2022-09-01 (×6): qty 1

## 2022-09-01 MED ORDER — POLYVINYL ALCOHOL 1.4 % OP SOLN: 1.0000 [drp] | OPHTHALMIC | Status: DC | PRN

## 2022-09-01 MED ORDER — LACTATED RINGERS IV BOLUS (SEPSIS)
250.0000 mL | Freq: Once | INTRAVENOUS | Status: AC
  Administered 2022-09-01: 250 mL via INTRAVENOUS

## 2022-09-01 MED ORDER — LACTATED RINGERS IV SOLN: INTRAVENOUS | Status: AC

## 2022-09-01 MED ORDER — CHLORHEXIDINE GLUCONATE CLOTH 2 % EX PADS
6.0000 | MEDICATED_PAD | Freq: Every day | CUTANEOUS | Status: DC
  Administered 2022-09-01 – 2022-09-03 (×3): 6 via TOPICAL

## 2022-09-01 MED ORDER — ACETAMINOPHEN 325 MG PO TABS: 650.0000 mg | ORAL_TABLET | Freq: Four times a day (QID) | ORAL | Status: DC | PRN

## 2022-09-01 MED ORDER — SODIUM CHLORIDE 0.9 % IV SOLN
2.0000 g | Freq: Once | INTRAVENOUS | Status: AC
  Administered 2022-09-01: 2 g via INTRAVENOUS
  Filled 2022-09-01: qty 12.5

## 2022-09-01 NOTE — ED Notes (Signed)
Pt had indwelling line to RUQ draining from GB,yellow/orange clear drainage. Site intact and no ss of infection noted. Pt a/o. Lives with husband. Pt slightly jaundiced.

## 2022-09-01 NOTE — Assessment & Plan Note (Addendum)
-  Status post In-N-Out catheterization x 1 retrieving 380 mL; patient has seen laying void spontaneously and adequately. -Continue fluid resuscitation and follow clinical course.

## 2022-09-01 NOTE — ED Notes (Signed)
Phlebotomy in

## 2022-09-01 NOTE — ED Notes (Signed)
Pt requesting nausea meds. Pt has not vomited since on ems truck.

## 2022-09-01 NOTE — Assessment & Plan Note (Signed)
-  Continue outpatient follow-up at [facility]. -Status post stent placement -Per patient's and family report initial biopsy negative for malignancy.

## 2022-09-01 NOTE — Assessment & Plan Note (Signed)
-  Sliding scale insulin will be initiated while inpatient -Follow CBGs fluctuation.

## 2022-09-01 NOTE — Progress Notes (Signed)
Dr Dyann Kief in to see patient. Dr Dyann Kief aware of patient blood pressures since admission to unit. Patient asymptomatic with no complaints and alert and oriented x4. IV fluids going per orders. Biliary tube dressing changed and insertion site assessed with Dr Dyann Kief present. Appears ok, pink just around insertion site. Old, dry drainage noted on dressing, light tan in color. Sutures at insertion site, clean, dry and intact. New dressing placed. Patient currently has no complaints of pain or discomfort with tube. In and out cath done per verbal from Dr Dyann Kief when patient arrived to unit due to urinary retention and bladder scan result. Good result with 390m out and urine sent to lab. Dr MDyann Kiefaware.

## 2022-09-01 NOTE — Assessment & Plan Note (Signed)
-  Blood pressure soft/low at time of admission in the setting of sepsis -Continue aggressive fluid resuscitation -Follow-up vital signs.

## 2022-09-01 NOTE — ED Notes (Signed)
Pt taken to ct with RN , Stanton Kidney

## 2022-09-01 NOTE — ED Notes (Signed)
Pt has been on purewick, no urine in canister. Diaper on is not wet. Edp aware and will bladder scan

## 2022-09-01 NOTE — ED Provider Notes (Signed)
[facility] EMERGENCY [facility]PARTMENT Provider Note   CSN: [id number] Arrival date & time: 09/01/22  0935     History  Chief Complaint  Patient presents with   Emesis    Julie Baxter is a 87 y.o. female.  Pt with c/o generally feeling weak and nausea/vomiting. Symptoms acute onset yesterday, moderate/severe, constant, persistent. Denies abd pain. No bloody or bilious emesis. Having normal bms. Has hx pancreatitic mass and cholecystitis - has had biliary drain in place for past few months - continues to drain normal amount. Has biliary stent. Denies fever although did have chills last night. No cough or uri symptoms. No dysuria or gu c/o.   The history is provided by the patient, the spouse, medical records and the EMS personnel. The history is limited by the condition of the patient.  Emesis Associated symptoms: chills   Associated symptoms: no abdominal pain, no cough, no fever, no headaches and no sore throat        Home Medications Prior to Admission medications   Medication Sig Start Date End Date Taking? Authorizing Provider  BD POSIFLUSH 0.9 % SOLN injection USE AS DIRECTED. 07/14/22  Yes Nadara Mustard, [employer] S, FNP  cholecalciferol (VITAMIN D3) 25 MCG (1000 UNIT) tablet Take 1,000 Units by mouth daily.   Yes [provider]  diclofenac Sodium (VOLTAREN) 1 % GEL Apply 2 g topically 4 (four) times daily as needed. Patient taking differently: Apply 2 g topically 4 (four) times daily as needed (pain). 02/14/21  Yes Mcarthur Rossetti, MD  glipiZIDE (GLUCOTROL) 5 MG tablet TAKE 1 TABLET BY MOUTH TWICE  DAILY BEFORE MEALS (BREAKFAST  AND LUNCH) 03/27/22  Yes Lauree Chandler, NP  insulin detemir (LEVEMIR FLEXPEN) 100 UNIT/ML FlexPen ADMINISTER 20 UNITS UNDER THE SKIN DAILY AT 7 PM 07/24/22  Yes Nadara Mustard, [employer], FNP  Pancrelipase, Lip-Prot-Amyl, (ZENPEP) 40000-126000 units CPEP Two capsules with first bite of meal, 1 capsule with first bite of snack. Up to 8 total daily.  08/25/22  Yes Mahala Menghini, PA-C  Propylene Glycol (SYSTANE BALANCE OP) Apply 1 drop to eye daily as needed (dry eyes).   Yes [provider]  simvastatin (ZOCOR) 40 MG tablet TAKE 1 TABLET BY MOUTH DAILY 04/07/22  Yes Lauree Chandler, NP  sodium bicarbonate 650 MG tablet Take 1 tablet by mouth 3 (three) times daily. 02/01/22  Yes [provider]  TRADJENTA 5 MG TABS tablet TAKE 1 TABLET(5 MG) BY MOUTH DAILY 06/05/22  Yes Lauree Chandler, NP  Blood Glucose Monitoring Suppl (ACCU-CHEK GUIDE ME) w/Device KIT 1 kit by Does not apply route as directed. ACCU CHEK GUIDE ME TEST STRIPS WITH KIT 180 strips, with 4 refills. 01/25/22   Lauree Chandler, NP  glucose blood (ACCU-CHEK GUIDE) test strip Use to test blood sugar twice daily. Dx: E11.22 02/13/22   Lauree Chandler, NP  Insulin Pen Needle (B-D UF III MINI PEN NEEDLES) 31G X 5 MM MISC Use to give insulin twice daily. Dx:E11.22 02/06/22   Lauree Chandler, NP      Allergies    Elemental sulfur, Naproxen, and Tramadol    Review of Systems   Review of Systems  Constitutional:  Positive for chills. Negative for fever.  HENT:  Negative for sore throat.   Eyes:  Negative for redness.  Respiratory:  Negative for cough and shortness of breath.   Cardiovascular:  Negative for chest pain.  Gastrointestinal:  Positive for nausea and vomiting. Negative for abdominal pain.  Genitourinary:  Negative for dysuria and flank pain.  Musculoskeletal:  Negative for back pain and neck pain.  Skin:  Negative for rash.  Neurological:  Positive for weakness. Negative for headaches.  Hematological:  Does not bruise/bleed easily.  Psychiatric/Behavioral:  Negative for confusion.     Physical Exam Updated Vital Signs BP 96/73   Pulse (!) 103   Temp (!) 95.1 F (35.1 C) (Rectal)   Resp 15   SpO2 100%  Physical Exam Vitals and nursing note reviewed.  Constitutional:      Appearance: Normal appearance. She is well-developed.  HENT:      Head: Atraumatic.     Nose: Nose normal.     Mouth/Throat:     Mouth: Mucous membranes are moist.  Eyes:     General: No scleral icterus.    Conjunctiva/sclera: Conjunctivae normal.  Neck:     Trachea: No tracheal deviation.  Cardiovascular:     Rate and Rhythm: Normal rate and regular rhythm.     Pulses: Normal pulses.     Heart sounds: Normal heart sounds. No murmur heard.    No friction rub. No gallop.  Pulmonary:     Effort: Pulmonary effort is normal. No respiratory distress.     Breath sounds: Normal breath sounds.  Abdominal:     General: Bowel sounds are normal. There is no distension.     Palpations: Abdomen is soft. There is no mass.     Tenderness: There is abdominal tenderness. There is no guarding.     Comments: Biliary drain in place, site intact without obvious infection at site, draining bile into tube/bag. Mild mid to upper abd tenderness.   Genitourinary:    Comments: No cva tenderness.  Musculoskeletal:        General: No swelling.     Cervical back: Normal range of motion and neck supple. No rigidity. No muscular tenderness.  Skin:    General: Skin is warm and dry.     Findings: No rash.  Neurological:     Mental Status: She is alert.     Comments: Alert, speech normal. Motor/sens grossly intact bil.   Psychiatric:        Mood and Affect: Mood normal.     ED Results / Procedures / Treatments   Labs (all labs ordered are listed, but only abnormal results are displayed) Results for orders placed or performed during the hospital encounter of 09/01/22  Culture, blood (Routine x 2)   Specimen: BLOOD  Result Value Ref Range   Specimen Description BLOOD BLOOD RIGHT FOREARM    Special Requests      BOTTLES DRAWN AEROBIC AND ANAEROBIC Blood Culture adequate volume Performed at [facility], [address], [place], [place] [address]    Culture PENDING    Report Status PENDING   Culture, blood (Routine x 2)   Specimen: Right Antecubital; Blood   Result Value Ref Range   Specimen Description RIGHT ANTECUBITAL    Special Requests      BOTTLES DRAWN AEROBIC AND ANAEROBIC Blood Culture adequate volume Performed at [facility], [address], [place], [place] [address]    Culture PENDING    Report Status PENDING   Resp panel by RT-PCR (RSV, Flu A&B, Covid) Anterior Nasal Swab   Specimen: Anterior Nasal Swab  Result Value Ref Range   SARS Coronavirus 2 by RT PCR NEGATIVE NEGATIVE   Influenza A by PCR NEGATIVE NEGATIVE   Influenza B by PCR NEGATIVE NEGATIVE  Resp Syncytial Virus by PCR NEGATIVE NEGATIVE  Lipase, blood  Result Value Ref Range   Lipase 26 11 - 51 U/L  Comprehensive metabolic panel  Result Value Ref Range   Sodium 129 (L) 135 - 145 mmol/L   Potassium 3.4 (L) 3.5 - 5.1 mmol/L   Chloride 102 98 - 111 mmol/L   CO2 12 (L) 22 - 32 mmol/L   Glucose, Bld 148 (H) 70 - 99 mg/dL   BUN 41 (H) 8 - 23 mg/dL   Creatinine, Ser 2.93 (H) 0.44 - 1.00 mg/dL   Calcium 7.7 (L) 8.9 - 10.3 mg/dL   Total Protein 5.9 (L) 6.5 - 8.1 g/dL   Albumin 2.0 (L) 3.5 - 5.0 g/dL   AST 69 (H) 15 - 41 U/L   ALT 111 (H) 0 - 44 U/L   Alkaline Phosphatase 1,040 (H) 38 - 126 U/L   Total Bilirubin 3.1 (H) 0.3 - 1.2 mg/dL   GFR, Estimated 15 (L) >60 mL/min   Anion gap 15 5 - 15  Lactic acid, plasma  Result Value Ref Range   Lactic Acid, Venous 2.4 (HH) 0.5 - 1.9 mmol/L  Lactic acid, plasma  Result Value Ref Range   Lactic Acid, Venous 3.6 (HH) 0.5 - 1.9 mmol/L  CBC with Differential  Result Value Ref Range   WBC 52.6 (HH) 4.0 - 10.5 K/uL   RBC 3.52 (L) 3.87 - 5.11 MIL/uL   Hemoglobin 10.5 (L) 12.0 - 15.0 g/dL   HCT 31.8 (L) 36.0 - 46.0 %   MCV 90.3 80.0 - 100.0 fL   MCH 29.8 26.0 - 34.0 pg   MCHC 33.0 30.0 - 36.0 g/dL   RDW 15.8 (H) 11.5 - 15.5 %   Platelets 216 150 - 400 K/uL   nRBC 0.0 0.0 - 0.2 %   Neutrophils Relative % 92 %   Neutro Abs 49.4 (H) 1.7 - 7.7 K/uL   Band Neutrophils 2 %   Lymphocytes Relative 1 %   Lymphs Abs 0.5  (L) 0.7 - 4.0 K/uL   Monocytes Relative 4 %   Monocytes Absolute 2.1 (H) 0.1 - 1.0 K/uL   Eosinophils Relative 0 %   Eosinophils Absolute 0.0 0.0 - 0.5 K/uL   Basophils Relative 0 %   Basophils Absolute 0.0 0.0 - 0.1 K/uL   WBC Morphology MILD LEFT SHIFT (1-5% METAS, OCC MYELO, OCC BANDS)    RBC Morphology MORPHOLOGY UNREMARKABLE    Smear Review PLATELETS APPEAR ADEQUATE    Metamyelocytes Relative 1 %   Abs Immature Granulocytes 0.50 (H) 0.00 - 0.07 K/uL  Protime-INR  Result Value Ref Range   Prothrombin Time 17.8 (H) 11.4 - 15.2 seconds   INR 1.5 (H) 0.8 - 1.2     EKG EKG Interpretation  Date/Time:  Friday September 01 2022 10:27:32 EST Ventricular Rate:  112 PR Interval:  143 QRS Duration: 82 QT Interval:  360 QTC Calculation: 438 R Axis:   -16 Text Interpretation: Sinus tachycardia Sinus arrhythmia Premature ventricular complexes Low voltage, extremity and precordial leads Confirmed by Lajean Saver [phone]) on 09/01/2022 10:34:55 AM  Radiology CT ABDOMEN PELVIS WO CONTRAST  Result Date: 09/01/2022 CLINICAL DATA:  Vomiting. Abdominal pain. History of a pancreatic mass and biliary stent. EXAM: CT ABDOMEN AND PELVIS WITHOUT CONTRAST TECHNIQUE: Multidetector CT imaging of the abdomen and pelvis was performed following the standard protocol without IV contrast. RADIATION DOSE REDUCTION: This exam was performed according to the departmental dose-optimization program which includes automated exposure control,  adjustment of the mA and/or kV according to patient size and/or use of iterative reconstruction technique. COMPARISON:  Contrast CT 07/13/2022 FINDINGS: Lower chest: Breathing motion at the lung bases. There is some dependent atelectasis or scarring. No pleural effusion. Coronary artery calcifications are seen. Hepatobiliary: As seen previously there is a pigtail catheter within the gallbladder. The gallbladder is contracted. There is a endoscopic lead placed stent within the common  duct with moderate to severe intrahepatic bile duct dilatation which is slightly increased from the prior CT scan. No separate intrinsic liver mass limits of noncontrast. Pancreas: Global pancreatic atrophy again identified with a cystic area towards the tail once again seen measuring a proximally 10 mm. Mass lesion in the head/uncinate region is again noted but poorly defined without contrast. There is some adjacent stranding in this location. This appears to abut the course of the portal vein at the portal venous confluence. Again please correlate with the prior contrast CT. Spleen: Spleen is nonenlarged. Adrenals/Urinary Tract: Left adrenal gland is preserved. The right adrenal gland is slightly thickened and nodular, unchanged from previous. Mild to moderate bilateral renal atrophy. Punctate nonobstructing upper pole right-sided renal stone. Nonspecific perinephric stranding. No ureteral stones are identified. Preserved contours of the urinary bladder. Stomach/Bowel: On this non oral contrast exam, the large bowel is of normal course and caliber with scattered stool. Appendix not clearly seen in the right lower quadrant. There is mild fluid in the stomach. Small bowel is nondilated. Vascular/Lymphatic: Diffuse vascular calcifications identified along the aorta, iliac vessels and of the branch vessels. No dilatation. Preserved caliber to the IVC. There is a small less than 1 cm size lymph nodes identified in the retroperitoneum. The lymph node in the portacaval space today on image 27 of series 2 measures 15 by 10 mm. In retrospect previously this measured 15 by 9 mm when measured in the same fashion, not significantly changed. Additional small nodes elsewhere are similar. Small pre cardiophrenic node is identified as well. Reproductive: Status post hysterectomy. No adnexal masses. Other: The previous fluid collection abutting the margin of the diaphragm posterior and superior to the right kidney is smaller  today. Previous dimensions of 4.8 x 2.7 cm and today on series 2, image 24 measuring 3.6 x 1.5 cm. No additional fluid collections identified. No scattered ascites or free air. Musculoskeletal: Extensive degenerative changes of the spine and pelvis. There is streak artifact related to the bilateral hip arthroplasties. Once again there is compression deformity along the inferior endplate of L2. Appearance is similar to the prior. IMPRESSION: Previous fluid collection identified retroperitoneum on the right side is slightly smaller today. No new fluid collections identified by noncontrast. No ascites. Again cholecystostomy tube in place. The gallbladder is contracted. Separate endoscopic biliary stent. There continues to be significant intrahepatic biliary ductal dilatation which may be slightly increased compared to the prior CT exam. Please correlate with clinical findings. Pancreatic mass of the head is again noted but less well defined without contrast. There is also some prominent but stable retroperitoneal nodes and some cystic areas along the pancreas. Please correlate with prior contrast exam Electronically Signed   By: Jill Side M.D.   On: 09/01/2022 13:21   DG Chest Port 1 View  Result Date: 09/01/2022 CLINICAL DATA:  Shortness of breath EXAM: PORTABLE CHEST 1 VIEW COMPARISON:  None Available. FINDINGS: The heart is normal in size. Atherosclerotic calcification of the aortic arch. Interstitial markings of the lungs without evidence of focal consolidation or large pleural  effusion. Mild bibasilar atelectasis. Thoracic spondylosis. Moderate bilateral acromioclavicular osteoarthritis. No acute osseous abnormality. IMPRESSION: No acute cardiopulmonary process. Electronically Signed   By: Keane Police D.O.   On: 09/01/2022 10:49    Procedures Procedures    Medications Ordered in ED Medications  lactated ringers infusion ( Intravenous Transfusing/Transfer 09/01/22 1314)  ceFEPIme (MAXIPIME) 2 g in  sodium chloride 0.9 % 100 mL IVPB (has no administration in time range)  vancomycin (VANCOREADY) IVPB 1250 mg/250 mL (1,250 mg Intravenous New Bag/Given 09/01/22 1327)  vancomycin variable dose per unstable renal function (pharmacist dosing) (has no administration in time range)  lactated ringers bolus 1,000 mL (0 mLs Intravenous Stopped 09/01/22 1252)    And  lactated ringers bolus 1,000 mL (0 mLs Intravenous Stopped 09/01/22 1252)    And  lactated ringers bolus 250 mL (0 mLs Intravenous Stopped 09/01/22 1313)  ceFEPIme (MAXIPIME) 2 g in sodium chloride 0.9 % 100 mL IVPB (0 g Intravenous Stopped 09/01/22 1252)  metroNIDAZOLE (FLAGYL) IVPB 500 mg (0 mg Intravenous Stopped 09/01/22 1207)  lactated ringers bolus 1,000 mL (1,000 mLs Intravenous New Bag/Given 09/01/22 1252)    ED Course/ Medical Decision Making/ A&P                           Medical Decision Making Problems Addressed: Acute sepsis [facility]): acute illness or injury with systemic symptoms that poses a threat to life or bodily functions AKI (acute kidney injury) ([place]): acute illness or injury with systemic symptoms that poses a threat to life or bodily functions Dehydration: acute illness or injury with systemic symptoms that poses a threat to life or bodily functions Elevated lactic acid level: acute illness or injury with systemic symptoms that poses a threat to life or bodily functions Hypoalbuminemia: chronic illness or injury Hypokalemia: acute illness or injury Nausea and vomiting in adult: acute illness or injury with systemic symptoms that poses a threat to life or bodily functions Pancreatic mass: chronic illness or injury with exacerbation, progression, or side effects of treatment that poses a threat to life or bodily functions  Amount and/or Complexity of Data Reviewed Independent Historian: EMS    Details: Family/ EMS, hx External Data Reviewed: notes. Labs: ordered. Decision-making details documented in ED Course. Radiology:  ordered and independent interpretation performed. Decision-making details documented in ED Course. ECG/medicine tests: ordered and independent interpretation performed. Decision-making details documented in ED Course. Discussion of management or test interpretation with external provider(s): hospitalist  Risk Prescription drug management. Decision regarding hospitalization.   Iv ns. Continuous pulse ox and cardiac monitoring. Labs ordered/sent. Imaging ordered.   Differential diagnosis includes sepsis, uti, biliary infection, dehydration/aki, etc.  . Dispo decision including potential need for admission considered - will get labs and imaging and reassess.   Reviewed nursing notes and prior charts for additional history. External reports reviewed. Additional history from: family, ems.   Cardiac monitor: sinus rhythm, rate 90.  Labs reviewed/interpreted by me - aki, elevated lfts, lactate high, wbc markedly high.  Additional ivf. Iv abx given, maxipime, flagyl, vanc.    Recent CT reviewed/interpreted by me - panc mass.  Today's ct reviewed/interpreted by me - no new fluid collection.   LR 30 cc/kg bolus. Iv antibiotics.   Zofran iv.   Hospitalists consulted for admission.  Prior charts note 'dnr' wishes.  Discussed w pt, pt confirms she wants medical tx, ivf, meds, etc, but does not want cpr, intubation or vent.   Lactate  is trending in wrong direction. No new c/o. Pt awake and alert, is breathing comfortably, sats good.  Additional fluids, will recheck,.   CRITICAL CARE RE: sepsis, hypotension, dehydration, aki.  Performed by: Mirna Mires Total critical care time: 110 minutes Critical care time was exclusive of separately billable procedures and treating other patients. Critical care was necessary to treat or prevent imminent or life-threatening deterioration. Critical care was time spent personally by me on the following activities: development of treatment plan with patient  and/or surrogate as well as nursing, discussions with consultants, evaluation of patient's response to treatment, examination of patient, obtaining history from patient or surrogate, ordering and performing treatments and interventions, ordering and review of laboratory studies, ordering and review of radiographic studies, pulse oximetry and re-evaluation of patient's condition.          Final Clinical Impression(s) / ED Diagnoses Final diagnoses:  Nausea and vomiting in adult  Acute sepsis ([place])  Dehydration  AKI (acute kidney injury) ([place])  Pancreatic mass  Hypoalbuminemia  Elevated lactic acid level  Hypokalemia    Rx / DC Orders ED Discharge Orders     None         Lajean Saver, MD 09/01/22 1354

## 2022-09-01 NOTE — Assessment & Plan Note (Signed)
-  Continue as needed anxiolytic regimen -Mood is currently stable.

## 2022-09-01 NOTE — Assessment & Plan Note (Addendum)
-  Acute kidney injury on chronic kidney disease a stage IV most likely from prerenal azotemia. -Continue to minimize nephrotoxic agents -Continue to maintain adequate hydration -Follow renal function trend. -Will check urinalysis.

## 2022-09-01 NOTE — Progress Notes (Addendum)
Pharmacy Antibiotic Note  Julie Baxter is a 87 y.o. female admitted on 09/01/2022 with  possible sepsis versus intra-abdominal infection .   Pharmacy has been consulted for cefepime and vancomycin dosing.  Patient currently with low temp of 95 in ED, baseline labs show AKI with scr of 2.9 giving a crcl<23m/min.  Plan: Cefepime  2g q24 hours  Will give vancomycin '1250mg'$  IV now then consider random level prior to redosing in 48 hours unless renal function improves.   Temp (24hrs), Avg:96.5 F (35.8 C), Min:95.1 F (35.1 C), Max:97.9 F (36.6 C)  No results for input(s): "WBC", "CREATININE", "LATICACIDVEN", "VANCOTROUGH", "VANCOPEAK", "VANCORANDOM", "GENTTROUGH", "GENTPEAK", "GENTRANDOM", "TOBRATROUGH", "TOBRAPEAK", "TOBRARND", "AMIKACINPEAK", "AMIKACINTROU", "AMIKACIN" in the last 168 hours.  CrCl cannot be calculated (Patient's most recent lab result is older than the maximum 21 days allowed.).    Allergies  Allergen Reactions   Elemental Sulfur Hives   Naproxen Swelling and Other (See Comments)    Blacked out - Face swelling    Tramadol Nausea And Vomiting    Thank you for allowing pharmacy to be a part of this patient's care.  FErin HearingPharmD., BCPS Clinical Pharmacist 09/01/2022 11:29 AM

## 2022-09-01 NOTE — Assessment & Plan Note (Signed)
-  Prior history of orthostatic hypotension -Follow-up vital signs -Patient low blood pressure currently most likely associated with acute ongoing infection -Continue fluid resuscitation and supportive care.

## 2022-09-01 NOTE — ED Notes (Signed)
Edp aware code sepsis and vs

## 2022-09-01 NOTE — H&P (Signed)
History and Physical    Patient: Julie Baxter ZOX:[id number] DOB: 03/07/1936 DOA: 09/01/2022 DOS: the patient was seen and examined on 09/01/2022 PCP: Rubie Maid, FNP  Patient coming from: Home  Chief Complaint:  Chief Complaint  Patient presents with   Emesis   HPI: Julie Baxter is a 87 y.o. female with medical history significant of type 2 diabetes mellitus with retinopathy and chronic kidney disease stage IV, hypertension, hyperlipidemia, pancreatic mass and recent admission for cholecystitis status post cholecystostomy; who presented to the hospital secondary to intermittent nausea, abdominal discomfort, general malaise and chills.  Patient reports symptoms has been present for the last 3 days or so and worsening.  Patient expressed no shortness of breath, no vomiting, no dysuria, no hematuria, no focal weaknesses, no melena or hematochezia; patient denies any chest pain, productive coughing spells or sick contacts.  In the ED patient was found meeting criteria for severe sepsis with lactic acidosis, hypotensive, acute on chronic renal failure as part of organ dysfunction, significant elevation in her WBCs in the 50,000 K and having transaminitis.  Patient reported that she was supposed to have pancreatic stent removed couple weeks ago but that had never happened.  Chest x-ray demonstrated no acute cardiopulmonary process, urinalysis/culture pending.  COVID PCR and influenza negative.  IV fluids, broad-spectrum antibiotics and supportive care initiated in the ED; patient expressed not wanting heroic measures or invasive/artificial treatment.  TRH consulted to place patient in the hospital for further evaluation and management.   Review of Systems: As mentioned in the history of present illness. All other systems reviewed and are negative. Past Medical History:  Diagnosis Date   Anxiety    regarding surgery; occassionally pt. states   Anxiety state, unspecified    Arthritis     Asymptomatic varicose veins    Broken arm    Chronic kidney disease    Diabetic retinopathy ([place])    Edema    Hard of hearing    Hyperpotassemia    Hypertension    Inflammation of joint of knee    right knee   Insomnia, unspecified    Multiple thyroid nodules    Osteoarthrosis, unspecified whether generalized or localized, unspecified site    Other abnormal blood chemistry    Other and unspecified hyperlipidemia    Other malaise and fatigue    Other specified disease of nail    Overweight(278.02)    Pain in joint, lower leg    Pain in joint, pelvic region and thigh    Peripheral vascular disease ([place])    varicose veins-  "Blood Clot anterior left LEG WITH PREGNANCY"   PONV (postoperative nausea and vomiting)    pt. states that she was sick after hip surgery and oral surgery   Type II or unspecified type diabetes mellitus with renal manifestations, not stated as uncontrolled(250.40)    Undiagnosed cardiac murmurs    Unspecified disorder of kidney and ureter    Unspecified disorder of kidney and ureter    Urinary tract infection, site not specified    Past Surgical History:  Procedure Laterality Date   ABDOMINAL HYSTERECTOMY     EYE SURGERY Bilateral    cataract extraction with IOL   IR EXCHANGE BILIARY DRAIN  07/13/2022   LIPOMA EXCISION     x2  bilateral buttocks   spinal meningitis  1957   TONSILLECTOMY     TOTAL HIP ARTHROPLASTY Right 11/29/2012   Procedure: RIGHT TOTAL HIP ARTHROPLASTY ANTERIOR APPROACH;  Surgeon: Harrell Gave  Kerry Fort, MD;  Location: [facility];  Service: Orthopedics;  Laterality: Right;   TOTAL HIP ARTHROPLASTY Left 06/01/2015   Procedure: LEFT TOTAL HIP ARTHROPLASTY ANTERIOR APPROACH;  Surgeon: Mcarthur Rossetti, MD;  Location: [place];  Service: Orthopedics;  Laterality: Left;   Social History:  reports that she has never smoked. She has never used smokeless tobacco. She reports that she does not drink alcohol and does not use drugs.  Allergies   Allergen Reactions   Elemental Sulfur Hives   Naproxen Swelling and Other (See Comments)    Blacked out - Face swelling    Tramadol Nausea And Vomiting    History reviewed. No pertinent family history.  Prior to Admission medications   Medication Sig Start Date End Date Taking? Authorizing Provider  BD POSIFLUSH 0.9 % SOLN injection USE AS DIRECTED. 07/14/22  Yes Nadara Mustard, [employer] S, FNP  cholecalciferol (VITAMIN D3) 25 MCG (1000 UNIT) tablet Take 1,000 Units by mouth daily.   Yes [provider]  diclofenac Sodium (VOLTAREN) 1 % GEL Apply 2 g topically 4 (four) times daily as needed. Patient taking differently: Apply 2 g topically 4 (four) times daily as needed (pain). 02/14/21  Yes Mcarthur Rossetti, MD  glipiZIDE (GLUCOTROL) 5 MG tablet TAKE 1 TABLET BY MOUTH TWICE  DAILY BEFORE MEALS (BREAKFAST  AND LUNCH) 03/27/22  Yes Lauree Chandler, NP  insulin detemir (LEVEMIR FLEXPEN) 100 UNIT/ML FlexPen ADMINISTER 20 UNITS UNDER THE SKIN DAILY AT 7 PM 07/24/22  Yes Nadara Mustard, [employer], FNP  Pancrelipase, Lip-Prot-Amyl, (ZENPEP) 40000-126000 units CPEP Two capsules with first bite of meal, 1 capsule with first bite of snack. Up to 8 total daily. 08/25/22  Yes Mahala Menghini, PA-C  Propylene Glycol (SYSTANE BALANCE OP) Apply 1 drop to eye daily as needed (dry eyes).   Yes [provider]  simvastatin (ZOCOR) 40 MG tablet TAKE 1 TABLET BY MOUTH DAILY 04/07/22  Yes Lauree Chandler, NP  sodium bicarbonate 650 MG tablet Take 1 tablet by mouth 3 (three) times daily. 02/01/22  Yes [provider]  TRADJENTA 5 MG TABS tablet TAKE 1 TABLET(5 MG) BY MOUTH DAILY 06/05/22  Yes Lauree Chandler, NP  Blood Glucose Monitoring Suppl (ACCU-CHEK GUIDE ME) w/Device KIT 1 kit by Does not apply route as directed. ACCU CHEK GUIDE ME TEST STRIPS WITH KIT 180 strips, with 4 refills. 01/25/22   Lauree Chandler, NP  glucose blood (ACCU-CHEK GUIDE) test strip Use to test blood sugar twice  daily. Dx: E11.22 02/13/22   Lauree Chandler, NP  Insulin Pen Needle (B-D UF III MINI PEN NEEDLES) 31G X 5 MM MISC Use to give insulin twice daily. Dx:E11.22 02/06/22   Lauree Chandler, NP    Physical Exam: Vitals:   09/01/22 1730 09/01/22 1800 09/01/22 1815 09/01/22 1819  BP: (!) 101/42   (!) 102/41  Pulse: 91 88 89 87  Resp: '12 12 16 16  '$ Temp:      TempSrc:      SpO2: 99% 99% 97% 97%  Weight:      Height:       General exam: Alert, awake, oriented x 3; no chest pain or shortness of breath.  Reports feeling slightly nauseous. Respiratory system: Good air movement bilaterally; no using accessory muscles. Cardiovascular system: Rate controlled, no rubs, no gallops, no JVD appreciated on exam. Gastrointestinal system: Abdomen is nondistended, soft and vaguely tender to palpation in her mid abdomen. No organomegaly or masses felt. Normal  bowel sounds heard.  Cholecystostomy tube in place without any signs of superficial infection or drainage. Central nervous system: Alert and oriented. No focal neurological deficits. Extremities: No cyanosis, clubbing or edema. Skin: Stage I pressure injury in her right buttocks without drainage or signs of superimposed infection.  Present at time of admission. Psychiatry: Judgement and insight appear normal. Mood & affect appropriate.   Data Reviewed: Lipase: 26 Comprehensive metabolic panel: Sodium 818, potassium 3.4, chloride 102, bicarb 12, glucose 148, BUN 41, creatinine 2.93, AST 69, ALT 811, alkaline phosphatase 1040, total bilirubin 3.1 and GFR 15. Lactic acid: 2.4>> 3.6>> 2.4 CBC: WBCs 52.6, hemoglobin 10.5 and platelet count 216 K Respiratory panel negative for COVID and influenza  Assessment and Plan: * Severe sepsis with lactic acidosis (HCC) -Most likely secondary to intra-abdominal infection from pancreatic stent and early cholangitis -Continue aggressive fluid resuscitation, antibiotics, as needed analgesics, antiemetics,  antipyretics and supportive care. -Patient needs to for stent removal at [facility]; and that will be follow after discharge. -She has expressed not wanting heroic measures or invasive intervention in case her condition declines -Wishes will be respected and patient will be placed DNR/DNI. -Follow sepsis features lactic acid level.  Acute urinary retention -No urine output appreciated after receiving about 4 L of fluids -In and out catheter was fallen retrieved and over 380 mL -Patient denying dysuria or suprapubic tenderness at this point. -Continue close monitoring and follow capacity to urinate on her own.  Pressure injury of skin -Stage I right buttock pressure injury present at time of admission -No signs of superimposed infection -Continue preventive measures and constant repositioning.  Pancreatic mass -Continue outpatient follow-up at [facility]. -Status post stent placement -Per patient's and family report initial biopsy negative for malignancy.  CKD (chronic kidney disease) stage 4, GFR 15-29 ml/min (HCC) -Acute kidney injury on chronic kidney disease a stage IV most likely from prerenal azotemia. -Continue to minimize nephrotoxic agents -Continue to maintain adequate hydration -Follow renal function trend. -Will check urinalysis.  Controlled type 2 diabetes mellitus with stable proliferative retinopathy of both eyes, without long-term current use of insulin (HCC) -Sliding scale insulin will be initiated while inpatient -Follow CBGs fluctuation.  Orthostatic hypotension -Prior history of orthostatic hypotension -Follow-up vital signs -Patient low blood pressure currently most likely associated with acute ongoing infection -Continue fluid resuscitation and supportive care.  Essential hypertension -Blood pressure soft/low at time of admission in the setting of sepsis -Continue aggressive fluid resuscitation -Follow-up vital signs.  Generalized anxiety disorder -Continue as  needed anxiolytic regimen -Mood is currently stable.  Mixed hyperlipidemia -Holding statin in the setting of transaminitis   Hyponatremia/hypokalemia/metabolic acidosis -Potassium 3.4; will replete and follow trend. -Metabolic acidosis in the settings of sepsis, dehydration and acute on chronic renal failure -Resume home sodium bicarbonate and provide fluid resuscitation -Continue to follow electrolytes trend.   Advance Care Planning:   Code Status: DNR   Consults: None  Family Communication: Son at bedside.  Severity of Illness: The appropriate patient status for this patient is INPATIENT. Inpatient status is judged to be reasonable and necessary in order to provide the required intensity of service to ensure the patient's safety. The patient's presenting symptoms, physical exam findings, and initial radiographic and laboratory data in the context of their chronic comorbidities is felt to place them at high risk for further clinical deterioration. Furthermore, it is not anticipated that the patient will be medically stable for discharge from the hospital within 2 midnights of admission.   *  I certify that at the point of admission it is my clinical judgment that the patient will require inpatient hospital care spanning beyond 2 midnights from the point of admission due to high intensity of service, high risk for further deterioration and high frequency of surveillance required.*  Author: Barton Dubois, MD 09/01/2022 6:38 PM  For on call review [email or web address].

## 2022-09-01 NOTE — Assessment & Plan Note (Addendum)
-  Continue to hold statin in the setting of transaminitis -Follow LFTs.

## 2022-09-01 NOTE — ED Triage Notes (Signed)
N/v since yesterday. Weak pulses per edp. A/o per ems. Denies pain. Has had 764m NS in route. Bp in route 84/47 with hr 126. Cbg 181. Vomited once with ems.

## 2022-09-01 NOTE — ED Notes (Signed)
Date and time results received: 09/01/22 1226 (use smartphrase ".now" to insert current time)  Test: wbc Critical Value: 66  Name of Provider Notified: dr Ashok Cordia  Orders Received? Or Actions Taken?:  advised

## 2022-09-01 NOTE — Assessment & Plan Note (Signed)
-  Most likely secondary to intra-abdominal infection from pancreatic stent and possible early cholangitis. -Adequately responding to aggressive fluid resuscitation and broad-spectrum antibiotic -Continue to follow culture results and continue current clinical management course. -Continue slowly advancing diet. -Lactic acid within normal limits, no fever and WBCs downtrending

## 2022-09-01 NOTE — Assessment & Plan Note (Signed)
-  Stage I right buttock pressure injury present at time of admission -No signs of superimposed infection -Continue preventive measures and constant repositioning.

## 2022-09-02 DIAGNOSIS — E876 Hypokalemia: Secondary | ICD-10-CM

## 2022-09-02 DIAGNOSIS — E1121 Type 2 diabetes mellitus with diabetic nephropathy: Secondary | ICD-10-CM

## 2022-09-02 DIAGNOSIS — R338 Other retention of urine: Secondary | ICD-10-CM | POA: Diagnosis not present

## 2022-09-02 DIAGNOSIS — F411 Generalized anxiety disorder: Secondary | ICD-10-CM | POA: Diagnosis not present

## 2022-09-02 DIAGNOSIS — R112 Nausea with vomiting, unspecified: Secondary | ICD-10-CM | POA: Diagnosis not present

## 2022-09-02 DIAGNOSIS — E860 Dehydration: Secondary | ICD-10-CM

## 2022-09-02 DIAGNOSIS — A419 Sepsis, unspecified organism: Secondary | ICD-10-CM | POA: Diagnosis not present

## 2022-09-02 LAB — GLUCOSE, CAPILLARY
Glucose-Capillary: 50 mg/dL — ABNORMAL LOW (ref 70–99)
Glucose-Capillary: 79 mg/dL (ref 70–99)
Glucose-Capillary: 242 mg/dL — ABNORMAL HIGH (ref 70–99)
Glucose-Capillary: 209 mg/dL — ABNORMAL HIGH (ref 70–99)
Glucose-Capillary: 177 mg/dL — ABNORMAL HIGH (ref 70–99)

## 2022-09-02 LAB — PHOSPHORUS: Phosphorus: 4.1 mg/dL (ref 2.5–4.6)

## 2022-09-02 LAB — CBC
HCT: 26.1 % — ABNORMAL LOW (ref 36.0–46.0)
Hemoglobin: 8.6 g/dL — ABNORMAL LOW (ref 12.0–15.0)
MCH: 29.7 pg (ref 26.0–34.0)
MCHC: 33 g/dL (ref 30.0–36.0)
MCV: 90 fL (ref 80.0–100.0)
Platelets: 145 10*3/uL — ABNORMAL LOW (ref 150–400)
RBC: 2.9 MIL/uL — ABNORMAL LOW (ref 3.87–5.11)
RDW: 15.9 % — ABNORMAL HIGH (ref 11.5–15.5)
WBC: 24.5 10*3/uL — ABNORMAL HIGH (ref 4.0–10.5)
nRBC: 0 % (ref 0.0–0.2)

## 2022-09-02 LAB — COMPREHENSIVE METABOLIC PANEL
ALT: 64 U/L — ABNORMAL HIGH (ref 0–44)
AST: 41 U/L (ref 15–41)
Albumin: <1.5 g/dL — ABNORMAL LOW (ref 3.5–5.0)
Alkaline Phosphatase: 619 U/L — ABNORMAL HIGH (ref 38–126)
Anion gap: 7 (ref 5–15)
BUN: 40 mg/dL — ABNORMAL HIGH (ref 8–23)
CO2: 17 mmol/L — ABNORMAL LOW (ref 22–32)
Calcium: 7.3 mg/dL — ABNORMAL LOW (ref 8.9–10.3)
Chloride: 109 mmol/L (ref 98–111)
Creatinine, Ser: 2.44 mg/dL — ABNORMAL HIGH (ref 0.44–1.00)
GFR, Estimated: 19 mL/min — ABNORMAL LOW (ref >60)
Glucose, Bld: 80 mg/dL (ref 70–99)
Potassium: 4 mmol/L (ref 3.5–5.1)
Sodium: 133 mmol/L — ABNORMAL LOW (ref 135–145)
Total Bilirubin: 2 mg/dL — ABNORMAL HIGH (ref 0.3–1.2)
Total Protein: 4.1 g/dL — ABNORMAL LOW (ref 6.5–8.1)

## 2022-09-02 LAB — LACTIC ACID, PLASMA: Lactic Acid, Venous: 1.5 mmol/L (ref 0.5–1.9)

## 2022-09-02 LAB — MAGNESIUM: Magnesium: 1.3 mg/dL — ABNORMAL LOW (ref 1.7–2.4)

## 2022-09-02 LAB — CORTISOL: Cortisol, Plasma: 74.6 ug/dL

## 2022-09-02 MED ORDER — MAGNESIUM SULFATE IN D5W 1-5 GM/100ML-% IV SOLN
1.0000 g | Freq: Once | INTRAVENOUS | Status: AC
  Administered 2022-09-02: 1 g via INTRAVENOUS
  Filled 2022-09-02: qty 100

## 2022-09-02 MED ORDER — ORAL CARE MOUTH RINSE: 15.0000 mL | OROMUCOSAL | Status: DC | PRN

## 2022-09-02 MED ORDER — LACTATED RINGERS IV SOLN: INTRAVENOUS | Status: AC

## 2022-09-02 MED ORDER — SODIUM CHLORIDE 0.9 % IV BOLUS
500.0000 mL | Freq: Once | INTRAVENOUS | Status: AC
  Administered 2022-09-02: 500 mL via INTRAVENOUS

## 2022-09-02 MED ORDER — INSULIN GLARGINE-YFGN 100 UNIT/ML ~~LOC~~ SOLN
5.0000 [IU] | Freq: Every day | SUBCUTANEOUS | Status: DC
  Administered 2022-09-02: 5 [IU] via SUBCUTANEOUS
  Filled 2022-09-02 (×2): qty 0.05

## 2022-09-02 NOTE — TOC Progression Note (Signed)
  Transition of Care (TOC) Screening Note   Patient Details  Name: Julie Baxter Date of Birth: 03-31-1936   Transition of Care [facility]) CM/SW Contact:    Boneta Lucks, RN Phone Number: 09/02/2022, 3:01 PM    Transition of Care Department [facility]) has reviewed patient and no TOC needs have been identified at this time. We will continue to monitor patient advancement through interdisciplinary progression rounds. If new patient transition needs arise, please place a TOC consult.      Barriers to Discharge: Continued Medical Work up

## 2022-09-02 NOTE — Progress Notes (Signed)
Progress Note   Patient: Julie Baxter BJS:[id number] DOB: [birth year] DOA: 09/01/2022     1 DOS: the patient was seen and examined on 09/02/2022   Brief hospital course: [name] is a 87 y.o. female with medical history significant of type 2 diabetes mellitus with retinopathy and chronic kidney disease stage IV, hypertension, hyperlipidemia, pancreatic mass and recent admission for cholecystitis status post cholecystostomy; who presented to the hospital secondary to intermittent nausea, abdominal discomfort, general malaise and chills.  Patient reports symptoms has been present for the last 3 days or so and worsening.   Patient expressed no shortness of breath, no vomiting, no dysuria, no hematuria, no focal weaknesses, no melena or hematochezia; patient denies any chest pain, productive coughing spells or sick contacts.   In the ED patient was found meeting criteria for severe sepsis with lactic acidosis, hypotensive, acute on chronic renal failure as part of organ dysfunction, significant elevation in her WBCs in the 50,000 K and having transaminitis.  Patient reported that she was supposed to have pancreatic stent removed couple weeks ago but that had never happened.  Chest x-ray demonstrated no acute cardiopulmonary process, urinalysis/culture pending.  COVID PCR and influenza negative.   IV fluids, broad-spectrum antibiotics and supportive care initiated in the ED; patient expressed not wanting heroic measures or invasive/artificial treatment.  TRH consulted to place patient in the hospital for further evaluation and management.  Assessment and Plan: * Severe sepsis with lactic acidosis (HCC) -Most likely secondary to intra-abdominal infection from pancreatic stent and possible early cholangitis. -Adequately responding to aggressive fluid resuscitation and broad-spectrum antibiotic -Continue to follow culture results and continue current clinical management course. -Continue slowly  advancing diet. -Lactic acid within normal limits, no fever and WBCs downtrending    Acute urinary retention -Status post In-N-Out catheterization x 1 retrieving 380 mL; patient has seen laying void spontaneously and adequately. -Continue fluid resuscitation and follow clinical course.  Pressure injury of skin -Stage I right buttock pressure injury present at time of admission -No signs of superimposed infection -Continue preventive measures and constant repositioning.  Pancreatic mass -Continue outpatient [facility]up at [facility]. -Status post stent placement -Per patient's and family report initial biopsy negative for malignancy.  CKD (chronic kidney disease) stage 4, GFR 15-29 ml/min (HCC) -Acute kidney injury on chronic kidney disease a stage IV most likely from prerenal azotemia. -Continue to minimize nephrotoxic agents -Continue to maintain adequate hydration -Continue to follow renal function trend; creatinine trending down appropriately.  Controlled type 2 diabetes mellitus with stable proliferative retinopathy of both eyes, without long-term current use of insulin (HCC) -Continue sliding scale insulin and follow CBG fluctuation. -Continue slowly advancing diet. -Recent A1c in December 2023 (9.1)  Orthostatic hypotension -Prior history of orthostatic hypotension -Continue aggressive fluid resuscitation and follow-up vital signs -Patient's low blood pressure currently most likely associated with acute ongoing infection.  Essential hypertension -Blood pressure soft/low at time of admission in the setting of sepsis -Continue aggressive fluid resuscitation -Cortisol level 74.6; not requiring the use of Solu-Cortef or midodrine currently -Continue to follow vital signs.  Generalized anxiety disorder -Continue as needed anxiolytic regimen -Mood is currently stable.  Mixed hyperlipidemia -Continue to hold statin in the setting of transaminitis -Follow  LFTs.   Hyponatremia/hypokalemia/metabolic acidosis/hypomagnesemia -Continue aggressive fluid resuscitation -Continue to replete electrolytes as needed -Continue to follow trend. -Bicarb up to 17 and adequately responding; continue oral supplementation.  Subjective:  No fever, no chest pain, no nausea, no vomiting, no shortness of  breath.  Patient expressed feeling hungry and also expressing significant treatment intraorbital symptoms.  Currently denying abdominal discomfort.  Physical Exam: Vitals:   09/02/22 1315 09/02/22 1400 09/02/22 1401 09/02/22 1640  BP:  129/86    Pulse: 86 84 86   Resp: 20 (!) 21 20   Temp:    98 F (36.7 C)  TempSrc:    Oral  SpO2: 97% 100% 98%   Weight:      Height:       General exam: Alert, awake, oriented x 3; reports feeling much better; no nausea or vomiting currently and expressing to be hungry.  Patient denies any chest pain, palpitations or shortness of breath. Respiratory system: Clear to auscultation. Respiratory effort normal.  Good saturation on room air; no using accessory muscles. Cardiovascular system: Rate controlled, no rubs, no gallops, no JVD. Gastrointestinal system: Abdomen is nondistended, soft and nontender on palpation.  Cholecystostomy tube in place; no guarding.  Positive bowel sounds. Central nervous system: Alert and oriented. No focal neurological deficits. Extremities: No cyanosis or clubbing. Skin: No petechiae.  Stage I pressure injury in her right buttocks present at time of admission without signs of superimposed infection. Psychiatry: Judgement and insight appear normal. Mood & affect appropriate.   Data Reviewed: Magnesium 1.3 Phosphorus 4.1 Comprehensive metabolic panel: Sodium 025, potassium 4.0, chloride 109, bicarb 17, BUN 40, creatinine 2.4, AST 41, ALT 64 and alkaline phosphatase 619; GFR 19. CBC: White blood cells 24.5, hemoglobin 9.6 and platelet count 145 K Lactic acid: 1.5   Family Communication:  husband at bedside.  Disposition: Status is: Inpatient Remains inpatient appropriate because: Continue treatment for severe sepsis due to intrabdominal infection with IV antibiotics and IV fluids.   Planned Discharge Destination: Home  Time spent: 50 minutes  Author: Barton Dubois, MD 09/02/2022 6:02 PM  For on call review [email or web address].

## 2022-09-02 NOTE — Progress Notes (Signed)
Hypoglycemic Event  CBG: 50  Treatment: 8 oz juice/soda  Symptoms: None  Follow-up CBG: Time:0753 CBG Result: 79  Possible Reasons for Event: Inadequate meal intake  Comments/MD notified:Dr Madera---new orders placed    Julie Baxter

## 2022-09-03 DIAGNOSIS — E860 Dehydration: Secondary | ICD-10-CM

## 2022-09-03 DIAGNOSIS — K8689 Other specified diseases of pancreas: Secondary | ICD-10-CM | POA: Diagnosis not present

## 2022-09-03 DIAGNOSIS — I10 Essential (primary) hypertension: Secondary | ICD-10-CM | POA: Diagnosis not present

## 2022-09-03 DIAGNOSIS — R7989 Other specified abnormal findings of blood chemistry: Secondary | ICD-10-CM

## 2022-09-03 DIAGNOSIS — R338 Other retention of urine: Secondary | ICD-10-CM | POA: Diagnosis not present

## 2022-09-03 DIAGNOSIS — A419 Sepsis, unspecified organism: Secondary | ICD-10-CM | POA: Diagnosis not present

## 2022-09-03 DIAGNOSIS — N179 Acute kidney failure, unspecified: Secondary | ICD-10-CM

## 2022-09-03 LAB — COMPREHENSIVE METABOLIC PANEL
ALT: 52 U/L — ABNORMAL HIGH (ref 0–44)
AST: 31 U/L (ref 15–41)
Albumin: <3.5 g/dL — ABNORMAL LOW (ref 3.5–5.0)
Alkaline Phosphatase: 606 U/L — ABNORMAL HIGH (ref 38–126)
Anion gap: 6 (ref 5–15)
BUN: 35 mg/dL — ABNORMAL HIGH (ref 8–23)
CO2: 16 mmol/L — ABNORMAL LOW (ref 22–32)
Calcium: 7.1 mg/dL — ABNORMAL LOW (ref 8.9–10.3)
Chloride: 112 mmol/L — ABNORMAL HIGH (ref 98–111)
Creatinine, Ser: 2.07 mg/dL — ABNORMAL HIGH (ref 0.44–1.00)
GFR, Estimated: 23 mL/min — ABNORMAL LOW (ref >60)
Glucose, Bld: 91 mg/dL (ref 70–99)
Potassium: 3.5 mmol/L (ref 3.5–5.1)
Sodium: 134 mmol/L — ABNORMAL LOW (ref 135–145)
Total Bilirubin: 1.7 mg/dL — ABNORMAL HIGH (ref 0.3–1.2)
Total Protein: 4.2 g/dL — ABNORMAL LOW (ref 6.5–8.1)

## 2022-09-03 LAB — CBC
HCT: 26.6 % — ABNORMAL LOW (ref 36.0–46.0)
Hemoglobin: 8.8 g/dL — ABNORMAL LOW (ref 12.0–15.0)
MCH: 29.3 pg (ref 26.0–34.0)
MCHC: 33.1 g/dL (ref 30.0–36.0)
MCV: 88.7 fL (ref 80.0–100.0)
Platelets: 100 10*3/uL — ABNORMAL LOW (ref 150–400)
RBC: 3 MIL/uL — ABNORMAL LOW (ref 3.87–5.11)
RDW: 15.9 % — ABNORMAL HIGH (ref 11.5–15.5)
WBC: 16.4 10*3/uL — ABNORMAL HIGH (ref 4.0–10.5)
nRBC: 0 % (ref 0.0–0.2)

## 2022-09-03 LAB — VANCOMYCIN, RANDOM: Vancomycin Rm: 9 ug/mL

## 2022-09-03 LAB — GLUCOSE, CAPILLARY
Glucose-Capillary: 62 mg/dL — ABNORMAL LOW (ref 70–99)
Glucose-Capillary: 189 mg/dL — ABNORMAL HIGH (ref 70–99)

## 2022-09-03 MED ORDER — AMOXICILLIN-POT CLAVULANATE 500-125 MG PO TABS: 1.0000 | ORAL_TABLET | Freq: Two times a day (BID) | ORAL | 0 refills | Status: AC

## 2022-09-03 MED ORDER — SIMVASTATIN 40 MG PO TABS: 40.0000 mg | ORAL_TABLET | Freq: Every day | ORAL | 2 refills | Status: DC

## 2022-09-03 MED ORDER — LEVEMIR FLEXPEN 100 UNIT/ML ~~LOC~~ SOPN: 12.0000 [IU] | PEN_INJECTOR | Freq: Every day | SUBCUTANEOUS | 3 refills | Status: DC

## 2022-09-03 MED ORDER — VANCOMYCIN HCL 750 MG/150ML IV SOLN
750.0000 mg | Freq: Once | INTRAVENOUS | Status: AC
  Administered 2022-09-03: 750 mg via INTRAVENOUS
  Filled 2022-09-03: qty 150

## 2022-09-03 NOTE — Progress Notes (Signed)
Patient given discharge instructions and verbalized understanding. All of patient's questioned answered. Son and husband at bedside. 2 peripheral ivs removed. 2 skin tears noted after tape was removed carefully. Skin tears dressed appropriately. Patient taken out via wheelchair to be discharged to home.

## 2022-09-03 NOTE — Progress Notes (Signed)
Pharmacy Antibiotic Note  Julie Baxter is a 87 y.o. female admitted on 09/01/2022 with sepsis.  Pharmacy has been consulted for vancomycin and cefepime dosing.  Pt w/ AKI and dosing using random vanc levels; this am random vanc level at 9 ~36h after vanc dose, below goal and appropriate to redose.  SCr improved this am at 2.07.  Plan: Give vancomycin '750mg'$  IV x1; consider AUC dosing if SCr levels out. Continue cefepime 2g IV Q24H.  Height: '5\' 5"'$  (165.1 cm) Weight: 65.7 kg (144 lb 13.5 oz) IBW/kg (Calculated) : 57  Temp (24hrs), Avg:97.9 F (36.6 C), Min:97.6 F (36.4 C), Max:98.2 F (36.8 C)  Recent Labs  Lab 09/01/22 1103 09/01/22 1244 09/01/22 1559 09/02/22 0354 09/03/22 0414  WBC 52.6*  --   --  24.5*  --   CREATININE 2.93*  --   --  2.44* 2.07*  LATICACIDVEN 2.4* 3.6* 2.4* 1.5  --   VANCORANDOM  --   --   --   --  9    Estimated Creatinine Clearance: 18.2 mL/min (A) (by C-G formula based on SCr of 2.07 mg/dL (H)).    Allergies  Allergen Reactions   Elemental Sulfur Hives   Naproxen Swelling and Other (See Comments)    Blacked out - Face swelling    Tramadol Nausea And Vomiting    Thank you for allowing pharmacy to be a part of this patient's care.  Wynona Neat, PharmD, BCPS  09/03/2022 4:51 AM

## 2022-09-03 NOTE — Discharge Summary (Signed)
Physician Discharge Summary   Patient: Julie Baxter MRN: [id number] DOB: 11/18/1935  Admit date:     09/01/2022  Discharge date: 09/03/22  Discharge Physician: Barton Dubois   PCP: Rubie Maid, FNP   Recommendations at discharge:  Repeat basic metabolic panel to follow electrolytes and renal function Repeat CBC to follow hemoglobin and WBCs trend/stability Make sure patient has follow-up with gastroenterology service as instructed Repeat LFTs in and if stable resume statin. Continue close monitoring of patient's CBGs/A1c with further adjustment to hypoglycemic regimen as needed.  Discharge Diagnoses: Principal Problem:   Severe sepsis with lactic acidosis (HCC) Active Problems:   Mixed hyperlipidemia   Generalized anxiety disorder   Essential hypertension   Orthostatic hypotension   Type 2 diabetes with nephropathy (HCC)   CKD (chronic kidney disease) stage 4, GFR 15-29 ml/min (HCC)   Pancreatic mass   Pressure injury of skin   Acute urinary retention   AKI (acute kidney injury) ([place])   Elevated lactic acid level   Dehydration  Brief hospital Course: Julie Baxter is a 87 y.o. female with medical history significant of type 2 diabetes mellitus with retinopathy and chronic kidney disease stage IV, hypertension, hyperlipidemia, pancreatic mass and recent admission for cholecystitis status post cholecystostomy; who presented to the hospital secondary to intermittent nausea, abdominal discomfort, general malaise and chills.  Patient reports symptoms has been present for the last 3 days or so and worsening.   Patient expressed no shortness of breath, no vomiting, no dysuria, no hematuria, no focal weaknesses, no melena or hematochezia; patient denies any chest pain, productive coughing spells or sick contacts.   In the ED patient was found meeting criteria for severe sepsis with lactic acidosis, hypotensive, acute on chronic renal failure as part of organ dysfunction,  significant elevation in her WBCs in the 50,000 K and having transaminitis.  Patient reported that she was supposed to have pancreatic stent removed couple weeks ago but that had never happened.  Chest x-ray demonstrated no acute cardiopulmonary process, urinalysis/culture pending.  COVID PCR and influenza negative.   IV fluids, broad-spectrum antibiotics and supportive care initiated in the ED; patient expressed not wanting heroic measures or invasive/artificial treatment.  TRH consulted to place patient in the hospital for further evaluation and management.  Assessment and Plan: * Severe sepsis with lactic acidosis (HCC) -Most likely secondary to intra-abdominal infection from pancreatic stent and possible early cholangitis. -Adequately responding to aggressive fluid resuscitation and broad-spectrum antibiotic -At time of discharge cultures without any growth. -Diet advanced and well-tolerated -Nausea, vomiting or abdominal pain. -Patient was afebrile and wanting to go home. -Antibiotics transition to oral Augmentin twice a day (dose adjusted for renal function) and instructions to follow-up with PCP/[facility] at discharge. -Lactic acid within normal limits, no fever and WBCs downtrending appropriately.    Acute urinary retention -Status post In-and-Out catheterization x 1 retrieving 380 mL; patient has since been able to void spontaneously and adequately. -Continue to maintain adequate hydration.   Pressure injury of skin -Stage I right buttock pressure injury present at time of admission -No signs of superimposed infection -Continue preventive measures and constant repositioning.   Pancreatic mass -Continue outpatient follow-up at [facility]. -Status post stent placement -Per patient's and family report initial biopsy negative for malignancy.   Acute kidney injury on CKD (chronic kidney disease) stage 4, GFR 15-29 ml/min (HCC) -Acute kidney injury on chronic kidney disease a stage IV  most likely from prerenal azotemia. -Continue to minimize nephrotoxic  agents -Continue to maintain adequate hydration -Continue to follow renal function trend; creatinine trending down appropriately. -Creatinine back to baseline at time of discharge.  Medications were adjusted based on current renal function.   uncontrolled type 2 diabetes mellitus with stable proliferative retinopathy of both eyes, with long-term current use of insulin (HCC) and associated nephropathy. -Continue the use of insulin at discharge -Patient advised to follow modified carbohydrate diet -Given the finding episodes of hypoglycemia while inpatient tradjenta and glipizide were kept on hold at discharge until follow up with PCP -please continue close monitoring of patient's CBGs/A1c. -Recent A1c in December 2023 (9.1)   Orthostatic hypotension -Prior history of orthostatic hypotension -Patient advised to maintain adequate hydration. -Patient's low blood pressure at presentation most likely associated with acute sepsis/infection. -Low blood pressure resolved and was stable at discharge.   Essential hypertension -After aggressive fluid resuscitation patient's blood pressure stabilized and remained within normal limits. -Cortisol level 74.6; not requiring the use of Solu-Cortef or midodrine currently during hospitalization. -Continue to follow and maintain adequate oral intake.   Generalized anxiety disorder -Continue as needed anxiolytic regimen -Mood is currently stable and well-controlled..   Mixed hyperlipidemia -Continue to hold statin in the setting of transaminitis -Follow LFTs; advised to resume the use of a statin -Heart healthy diet discussed with patient.  Hyponatremia/hypokalemia/metabolic acidosis/hypomagnesemia -Continue to maintain adequate hydration -Electrolytes for the most part repleted at time of discharge -Continue daily bicarb supplementation -Repeat basic metabolic panel and magnesium  level at follow-up visit to reassess electrolytes trend/stability.    Consultants: None Procedures performed: See below for x-ray reports. Disposition: Home Diet recommendation: Heart healthy modified carbohydrate diet.  DISCHARGE MEDICATION: Allergies as of 09/03/2022       Reactions   Elemental Sulfur Hives   Naproxen Swelling, Other (See Comments)   Blacked out - Face swelling   Tramadol Nausea And Vomiting        Medication List     STOP taking these medications    glipiZIDE 5 MG tablet Commonly known as: GLUCOTROL   Tradjenta 5 MG Tabs tablet Generic drug: linagliptin       TAKE these medications    Accu-Chek Guide Me w/Device Kit 1 kit by Does not apply route as directed. ACCU CHEK GUIDE ME TEST STRIPS WITH KIT 180 strips, with 4 refills.   Accu-Chek Guide test strip Generic drug: glucose blood Use to test blood sugar twice daily. Dx: E11.22   amoxicillin-clavulanate 500-125 MG tablet Commonly known as: Augmentin Take 1 tablet by mouth every 12 (twelve) hours for 8 days.   B-D UF III MINI PEN NEEDLES 31G X 5 MM Misc Generic drug: Insulin Pen Needle Use to give insulin twice daily. Dx:E11.22   BD PosiFlush 0.9 % Soln injection Generic drug: sodium chloride flush USE AS DIRECTED.   cholecalciferol 25 MCG (1000 UNIT) tablet Commonly known as: VITAMIN D3 Take 1,000 Units by mouth daily.   diclofenac Sodium 1 % Gel Commonly known as: VOLTAREN Apply 2 g topically 4 (four) times daily as needed. What changed: reasons to take this   Levemir FlexPen 100 UNIT/ML FlexPen Generic drug: insulin detemir Inject 12 Units into the skin at bedtime. What changed: See the new instructions.   simvastatin 40 MG tablet Commonly known as: ZOCOR Take 1 tablet (40 mg total) by mouth daily. Hold medication until follow-up with PCP. What changed: additional instructions   sodium bicarbonate 650 MG tablet Take 1 tablet by mouth 3 (three) times daily.  SYSTANE  BALANCE OP Apply 1 drop to eye daily as needed (dry eyes).   Zenpep 40000-126000 units Cpep Generic drug: Pancrelipase (Lip-Prot-Amyl) Two capsules with first bite of meal, 1 capsule with first bite of snack. Up to 8 total daily.        Follow-up Information     Rubie Maid, FNP. Schedule an appointment as soon as possible for a visit in 1 week(s).   Specialty: Family Medicine Contact information: [address] [place] [address] [phone]                Discharge Exam: Danley Danker Weights   09/01/22 1626  Weight: 65.7 kg   General exam: Alert, awake, oriented x 3; reports feeling much better; no nausea or vomiting currently and expressing to be hungry.  Patient denies any chest pain, palpitations or shortness of breath.  No abdominal pain.  Patient afebrile. Respiratory system: Clear to auscultation. Respiratory effort normal.  Good saturation on room air; no using accessory muscles. Cardiovascular system: Rate controlled, no rubs, no gallops, no JVD. Gastrointestinal system: Abdomen is nondistended, soft and nontender on palpation.  Cholecystostomy tube in place; no guarding.  Positive bowel sounds. Central nervous system: Alert and oriented. No focal neurological deficits. Extremities: No cyanosis or clubbing. Skin: No petechiae.  Stage I pressure injury in her right buttocks present at time of admission without signs of superimposed infection. Psychiatry: Judgement and insight appear normal. Mood & affect appropriate.   Condition at discharge: Stable and improved.  The results of significant diagnostics from this hospitalization (including imaging, microbiology, ancillary and laboratory) are listed below for reference.   Imaging Studies: CT ABDOMEN PELVIS WO CONTRAST  Result Date: 09/01/2022 CLINICAL DATA:  Vomiting. Abdominal pain. History of a pancreatic mass and biliary stent. EXAM: CT ABDOMEN AND PELVIS WITHOUT CONTRAST TECHNIQUE: Multidetector CT  imaging of the abdomen and pelvis was performed following the standard protocol without IV contrast. RADIATION DOSE REDUCTION: This exam was performed according to the departmental dose-optimization program which includes automated exposure control, adjustment of the mA and/or kV according to patient size and/or use of iterative reconstruction technique. COMPARISON:  Contrast CT 07/13/2022 FINDINGS: Lower chest: Breathing motion at the lung bases. There is some dependent atelectasis or scarring. No pleural effusion. Coronary artery calcifications are seen. Hepatobiliary: As seen previously there is a pigtail catheter within the gallbladder. The gallbladder is contracted. There is a endoscopic lead placed stent within the common duct with moderate to severe intrahepatic bile duct dilatation which is slightly increased from the prior CT scan. No separate intrinsic liver mass limits of noncontrast. Pancreas: Global pancreatic atrophy again identified with a cystic area towards the tail once again seen measuring a proximally 10 mm. Mass lesion in the head/uncinate region is again noted but poorly defined without contrast. There is some adjacent stranding in this location. This appears to abut the course of the portal vein at the portal venous confluence. Again please correlate with the prior contrast CT. Spleen: Spleen is nonenlarged. Adrenals/Urinary Tract: Left adrenal gland is preserved. The right adrenal gland is slightly thickened and nodular, unchanged from previous. Mild to moderate bilateral renal atrophy. Punctate nonobstructing upper pole right-sided renal stone. Nonspecific perinephric stranding. No ureteral stones are identified. Preserved contours of the urinary bladder. Stomach/Bowel: On this non oral contrast exam, the large bowel is of normal course and caliber with scattered stool. Appendix not clearly seen in the right lower quadrant. There is mild fluid in the  stomach. Small bowel is nondilated.  Vascular/Lymphatic: Diffuse vascular calcifications identified along the aorta, iliac vessels and of the branch vessels. No dilatation. Preserved caliber to the IVC. There is a small less than 1 cm size lymph nodes identified in the retroperitoneum. The lymph node in the portacaval space today on image 27 of series 2 measures 15 by 10 mm. In retrospect previously this measured 15 by 9 mm when measured in the same fashion, not significantly changed. Additional small nodes elsewhere are similar. Small pre cardiophrenic node is identified as well. Reproductive: Status post hysterectomy. No adnexal masses. Other: The previous fluid collection abutting the margin of the diaphragm posterior and superior to the right kidney is smaller today. Previous dimensions of 4.8 x 2.7 cm and today on series 2, image 24 measuring 3.6 x 1.5 cm. No additional fluid collections identified. No scattered ascites or free air. Musculoskeletal: Extensive degenerative changes of the spine and pelvis. There is streak artifact related to the bilateral hip arthroplasties. Once again there is compression deformity along the inferior endplate of L2. Appearance is similar to the prior. IMPRESSION: Previous fluid collection identified retroperitoneum on the right side is slightly smaller today. No new fluid collections identified by noncontrast. No ascites. Again cholecystostomy tube in place. The gallbladder is contracted. Separate endoscopic biliary stent. There continues to be significant intrahepatic biliary ductal dilatation which may be slightly increased compared to the prior CT exam. Please correlate with clinical findings. Pancreatic mass of the head is again noted but less well defined without contrast. There is also some prominent but stable retroperitoneal nodes and some cystic areas along the pancreas. Please correlate with prior contrast exam Electronically Signed   By: Jill Side M.D.   On: 09/01/2022 13:21   DG Chest Port 1  View  Result Date: 09/01/2022 CLINICAL DATA:  Shortness of breath EXAM: PORTABLE CHEST 1 VIEW COMPARISON:  None Available. FINDINGS: The heart is normal in size. Atherosclerotic calcification of the aortic arch. Interstitial markings of the lungs without evidence of focal consolidation or large pleural effusion. Mild bibasilar atelectasis. Thoracic spondylosis. Moderate bilateral acromioclavicular osteoarthritis. No acute osseous abnormality. IMPRESSION: No acute cardiopulmonary process. Electronically Signed   By: Keane Police D.O.   On: 09/01/2022 10:49    Microbiology: Results for orders placed or performed during the hospital encounter of 09/01/22  Culture, blood (Routine x 2)     Status: None (Preliminary result)   Collection Time: 09/01/22 11:03 AM   Specimen: BLOOD  Result Value Ref Range Status   Specimen Description BLOOD BLOOD RIGHT FOREARM  Final   Special Requests   Final    BOTTLES DRAWN AEROBIC AND ANAEROBIC Blood Culture adequate volume   Culture   Final    NO GROWTH 2 DAYS Performed at [facility], [address], [place], [place] [address]    Report Status PENDING  Incomplete  Culture, blood (Routine x 2)     Status: None (Preliminary result)   Collection Time: 09/01/22 11:03 AM   Specimen: Right Antecubital; Blood  Result Value Ref Range Status   Specimen Description RIGHT ANTECUBITAL  Final   Special Requests   Final    BOTTLES DRAWN AEROBIC AND ANAEROBIC Blood Culture adequate volume   Culture   Final    NO GROWTH 2 DAYS Performed at [facility], [address] [place], [place] [address]    Report Status PENDING  Incomplete  Resp panel by RT-PCR (RSV, Flu A&B, Covid) Anterior Nasal Swab  Status: None   Collection Time: 09/01/22 11:19 AM   Specimen: Anterior Nasal Swab  Result Value Ref Range Status   SARS Coronavirus 2 by RT PCR NEGATIVE NEGATIVE Final    Comment: (NOTE) SARS-CoV-2 target nucleic acids are NOT DETECTED.  The SARS-CoV-2 RNA is generally  detectable in upper respiratory specimens during the acute phase of infection. The lowest concentration of SARS-CoV-2 viral copies this assay can detect is 138 copies/mL. A negative result does not preclude SARS-Cov-2 infection and should not be used as the sole basis for treatment or other patient management decisions. A negative result may occur with  improper specimen collection/handling, submission of specimen other than nasopharyngeal swab, presence of viral mutation(s) within the areas targeted by this assay, and inadequate number of viral copies(<138 copies/mL). A negative result must be combined with clinical observations, patient history, and epidemiological information. The expected result is Negative.  Fact Sheet for Patients:  EntrepreneurPulse.com.au  Fact Sheet for Healthcare Providers:  IncredibleEmployment.be  This test is no t yet approved or cleared by the [place] FDA and  has been authorized for detection and/or diagnosis of SARS-CoV-2 by FDA under an Emergency Use Authorization (EUA). This EUA will remain  in effect (meaning this test can be used) for the duration of the COVID-19 declaration under Section 564(b)(1) of the Act, 21 U.S.C.section 360bbb-3(b)(1), unless the authorization is terminated  or revoked sooner.       Influenza A by PCR NEGATIVE NEGATIVE Final   Influenza B by PCR NEGATIVE NEGATIVE Final    Comment: (NOTE) The Xpert Xpress SARS-CoV-2/FLU/RSV plus assay is intended as an aid in the diagnosis of influenza from Nasopharyngeal swab specimens and should not be used as a sole basis for treatment. Nasal washings and aspirates are unacceptable for Xpert Xpress SARS-CoV-2/FLU/RSV testing.  Fact Sheet for Patients: EntrepreneurPulse.com.au  Fact Sheet for Healthcare Providers: IncredibleEmployment.be  This test is not yet approved or cleared by the [place] FDA  and has been authorized for detection and/or diagnosis of SARS-CoV-2 by FDA under an Emergency Use Authorization (EUA). This EUA will remain in effect (meaning this test can be used) for the duration of the COVID-19 declaration under Section 564(b)(1) of the Act, 21 U.S.C. section 360bbb-3(b)(1), unless the authorization is terminated or revoked.     Resp Syncytial Virus by PCR NEGATIVE NEGATIVE Final    Comment: (NOTE) Fact Sheet for Patients: EntrepreneurPulse.com.au  Fact Sheet for Healthcare Providers: IncredibleEmployment.be  This test is not yet approved or cleared by the [place] FDA and has been authorized for detection and/or diagnosis of SARS-CoV-2 by FDA under an Emergency Use Authorization (EUA). This EUA will remain in effect (meaning this test can be used) for the duration of the COVID-19 declaration under Section 564(b)(1) of the Act, 21 U.S.C. section 360bbb-3(b)(1), unless the authorization is terminated or revoked.  Performed at [facility], [address] [place], [place] [address]   MRSA Next Gen by PCR, Nasal     Status: None   Collection Time: 09/01/22  3:32 PM   Specimen: Nasal Mucosa; Nasal Swab  Result Value Ref Range Status   MRSA by PCR Next Gen NOT DETECTED NOT DETECTED Final    Comment: (NOTE) The GeneXpert MRSA Assay (FDA approved for NASAL specimens only), is one component of a comprehensive MRSA colonization surveillance program. It is not intended to diagnose MRSA infection nor to guide or monitor treatment for MRSA infections. Test performance is not FDA approved in patients less than  8 years old. Performed at [facility], [address], [place], [place] [address]     Labs: CBC: Recent Labs  Lab 09/01/22 1103 09/02/22 0354 09/03/22 0414  WBC 52.6* 24.5* 16.4*  NEUTROABS 49.4*  --   --   HGB 10.5* 8.6* 8.8*  HCT 31.8* 26.1* 26.6*  MCV 90.3 90.0 88.7  PLT 216 145* 397*   Basic Metabolic  Panel: Recent Labs  Lab 09/01/22 1103 09/02/22 0354 09/03/22 0414  NA 129* 133* 134*  K 3.4* 4.0 3.5  CL 102 109 112*  CO2 12* 17* 16*  GLUCOSE 148* 80 91  BUN 41* 40* 35*  CREATININE 2.93* 2.44* 2.07*  CALCIUM 7.7* 7.3* 7.1*  MG  --  1.3*  --   PHOS  --  4.1  --    Liver Function Tests: Recent Labs  Lab 09/01/22 1103 09/02/22 0354 09/03/22 0414  AST 69* 41 31  ALT 111* 64* 52*  ALKPHOS 1,040* 619* 606*  BILITOT 3.1* 2.0* 1.7*  PROT 5.9* 4.1* 4.2*  ALBUMIN 2.0* <1.5* <3.5*   CBG: Recent Labs  Lab 09/02/22 1136 09/02/22 1638 09/02/22 2021 09/03/22 0735 09/03/22 1101  GLUCAP 242* 209* 177* 62* 189*    Discharge time spent: greater than 30 minutes.  Signed: Barton Dubois, MD [facility] 09/03/2022

## 2022-09-04 ENCOUNTER — Telehealth: Payer: Self-pay

## 2022-09-04 DIAGNOSIS — K831 Obstruction of bile duct: Secondary | ICD-10-CM | POA: Diagnosis not present

## 2022-09-04 NOTE — Patient Outreach (Signed)
  Care Coordination [facility] Note Transition Care Management Unsuccessful Follow-up Telephone Call  Date of discharge and from where:  09/03/22 [facility] dx severe sepsis with lactic acidosis  Attempts:  1st Attempt  Reason for unsuccessful TCM follow-up call:  Left voice message Peter Garter RN, [facility] [place] Management [phone]

## 2022-09-05 ENCOUNTER — Telehealth: Payer: Self-pay

## 2022-09-05 NOTE — Patient Outreach (Signed)
  Care Coordination [facility] Note Transition Care Management Follow-up Telephone Call Date of discharge and from where: 09/03/22 Julie Baxter dx sepsis with lactic acidosis How have you been since you were released from the hospital? I am feeling better.  I saw the [place] specialist yesterday and I am going to have a metal stent put in next week.  My husband and daughter-in-law are making sure I have everything I need Any questions or concerns? No  Items Reviewed: Did the pt receive and understand the discharge instructions provided? Yes  Medications obtained and verified? Yes  Other? No  Any new allergies since your discharge? No  Dietary orders reviewed? No Do you have support at home? Yes   Home Care and Equipment/Supplies: Were home health services ordered? no If so, what is the name of the agency? N/a  Has the agency set up a time to come to the patient's home? not applicable Were any new equipment or medical supplies ordered?  No What is the name of the medical supply agency? N/a Were you able to get the supplies/equipment? not applicable Do you have any questions related to the use of the equipment or supplies? No  Functional Questionnaire: (I = Independent and D = Dependent) ADLs: I family assists if needed  Bathing/Dressing- I  Meal Prep- I  Eating- I  Maintaining continence- I  Transferring/Ambulation- I  Managing Meds- I  Follow up appointments reviewed:  PCP Hospital f/u appt confirmed? Yes  Scheduled to see Mila Merry FNP  on 09/12/22 @ 2 PM. [facility] f/u appt confirmed? Yes  Scheduled to see Dr Gladstone Lighter on 09/04/22 @ 1:15 PM. Are transportation arrangements needed? No  If their condition worsens, is the pt aware to call PCP or go to the Emergency Dept.? Yes Was the patient provided with contact information for the PCP's office or ED? Yes Was to pt encouraged to call back with questions or concerns? Yes  SDOH assessments and interventions completed:    Yes SDOH Interventions Today    Flowsheet Row Most Recent Value  SDOH Interventions   Food Insecurity Interventions Intervention Not Indicated  Transportation Interventions Intervention Not Indicated       Care Coordination Interventions:  No Care Coordination interventions needed at this time.   Encounter Outcome:  Pt. Visit Completed

## 2022-09-05 NOTE — Telephone Encounter (Signed)
Thank you for the update. Chart reviewed.

## 2022-09-05 NOTE — Telephone Encounter (Signed)
Pt called to let you know that she has been in and out of [facility] with severe dehydration and a severe eye infection. Pt states that she went to [facility] and the pancreatic stent has resolved itself. Pt states that they are going to place a metal stent and may eventually remove her gallbladder. She said that she is doing much better and that the bag is coming out because it is backing up bad. Pt wanted to let you know that she thinks you are wonderful and appreciates everything that we have done for her.

## 2022-09-06 LAB — CULTURE, BLOOD (ROUTINE X 2)
Culture: NO GROWTH
Culture: NO GROWTH
Special Requests: ADEQUATE
Special Requests: ADEQUATE

## 2022-09-07 ENCOUNTER — Other Ambulatory Visit (HOSPITAL_COMMUNITY): Payer: Medicare Other

## 2022-09-08 ENCOUNTER — Other Ambulatory Visit: Payer: Self-pay

## 2022-09-08 ENCOUNTER — Other Ambulatory Visit: Payer: Self-pay | Admitting: Nurse Practitioner

## 2022-09-08 ENCOUNTER — Telehealth: Payer: Self-pay | Admitting: Family Medicine

## 2022-09-08 DIAGNOSIS — E1165 Type 2 diabetes mellitus with hyperglycemia: Secondary | ICD-10-CM

## 2022-09-08 MED ORDER — BD PEN NEEDLE MINI U/F 31G X 5 MM MISC: 1 refills | Status: DC

## 2022-09-08 NOTE — Telephone Encounter (Signed)
Called pt aware request has been sent to pharmacy.

## 2022-09-08 NOTE — Telephone Encounter (Signed)
Patient called to request script be called in today for   Insulin Pen Needle (B-D UF III MINI PEN NEEDLES) 31G X 5 MM MISC   Pharmacy confirmed as:  [id number] - [place], [address] [place],  [place] [address] Phone: [phone]  Fax: [phone] DEA #: [id number]   Patient stated she only has 2 left.  Please advise at [phone]

## 2022-09-11 ENCOUNTER — Other Ambulatory Visit: Payer: Medicare Other

## 2022-09-11 DIAGNOSIS — K8689 Other specified diseases of pancreas: Secondary | ICD-10-CM

## 2022-09-11 DIAGNOSIS — R17 Unspecified jaundice: Secondary | ICD-10-CM

## 2022-09-11 DIAGNOSIS — I10 Essential (primary) hypertension: Secondary | ICD-10-CM

## 2022-09-11 DIAGNOSIS — N184 Chronic kidney disease, stage 4 (severe): Secondary | ICD-10-CM

## 2022-09-11 DIAGNOSIS — E1121 Type 2 diabetes mellitus with diabetic nephropathy: Secondary | ICD-10-CM

## 2022-09-11 DIAGNOSIS — R7989 Other specified abnormal findings of blood chemistry: Secondary | ICD-10-CM | POA: Diagnosis not present

## 2022-09-12 ENCOUNTER — Telehealth: Payer: Self-pay

## 2022-09-12 ENCOUNTER — Inpatient Hospital Stay: Payer: Medicare Other | Admitting: Family Medicine

## 2022-09-12 LAB — CBC WITH DIFFERENTIAL/PLATELET
Absolute Monocytes: 1534 cells/uL — ABNORMAL HIGH (ref 200–950)
Basophils Absolute: 39 cells/uL (ref 0–200)
Basophils Relative: 0.3 %
Eosinophils Absolute: 169 cells/uL (ref 15–500)
Eosinophils Relative: 1.3 %
HCT: 28.9 % — ABNORMAL LOW (ref 35.0–45.0)
Hemoglobin: 9.7 g/dL — ABNORMAL LOW (ref 11.7–15.5)
Lymphs Abs: 1898 cells/uL (ref 850–3900)
MCH: 30.3 pg (ref 27.0–33.0)
MCHC: 33.6 g/dL (ref 32.0–36.0)
MCV: 90.3 fL (ref 80.0–100.0)
MPV: 11.3 fL (ref 7.5–12.5)
Monocytes Relative: 11.8 %
Neutro Abs: 9360 cells/uL — ABNORMAL HIGH (ref 1500–7800)
Neutrophils Relative %: 72 %
Platelets: 281 10*3/uL (ref 140–400)
RBC: 3.2 10*6/uL — ABNORMAL LOW (ref 3.80–5.10)
RDW: 13.6 % (ref 11.0–15.0)
Total Lymphocyte: 14.6 %
WBC: 13 10*3/uL — ABNORMAL HIGH (ref 3.8–10.8)

## 2022-09-12 LAB — COMPREHENSIVE METABOLIC PANEL
AG Ratio: 0.9 (calc) — ABNORMAL LOW (ref 1.0–2.5)
ALT: 146 U/L — ABNORMAL HIGH (ref 6–29)
AST: 174 U/L — ABNORMAL HIGH (ref 10–35)
Albumin: 2.3 g/dL — ABNORMAL LOW (ref 3.6–5.1)
Alkaline phosphatase (APISO): 1463 U/L — ABNORMAL HIGH (ref 37–153)
BUN/Creatinine Ratio: 10 (calc) (ref 6–22)
BUN: 22 mg/dL (ref 7–25)
CO2: 20 mmol/L (ref 20–32)
Calcium: 8.1 mg/dL — ABNORMAL LOW (ref 8.6–10.4)
Chloride: 105 mmol/L (ref 98–110)
Creat: 2.1 mg/dL — ABNORMAL HIGH (ref 0.60–0.95)
Globulin: 2.7 g/dL (calc) (ref 1.9–3.7)
Glucose, Bld: 130 mg/dL — ABNORMAL HIGH (ref 65–99)
Potassium: 5.1 mmol/L (ref 3.5–5.3)
Sodium: 134 mmol/L — ABNORMAL LOW (ref 135–146)
Total Bilirubin: 13.7 mg/dL — ABNORMAL HIGH (ref 0.2–1.2)
Total Protein: 5 g/dL — ABNORMAL LOW (ref 6.1–8.1)

## 2022-09-12 LAB — BILIRUBIN, FRACTIONATED(TOT/DIR/INDIR)
Bilirubin, Direct: 7.6 mg/dL — ABNORMAL HIGH (ref 0.0–0.2)
Indirect Bilirubin: 5.5 mg/dL (calc) — ABNORMAL HIGH (ref 0.2–1.2)
Total Bilirubin: 13.1 mg/dL — ABNORMAL HIGH (ref 0.2–1.2)

## 2022-09-12 LAB — HEPATIC FUNCTION PANEL
AG Ratio: 0.9 (calc) — ABNORMAL LOW (ref 1.0–2.5)
ALT: 146 U/L — ABNORMAL HIGH (ref 6–29)
AST: 174 U/L — ABNORMAL HIGH (ref 10–35)
Albumin: 2.3 g/dL — ABNORMAL LOW (ref 3.6–5.1)
Alkaline phosphatase (APISO): 1463 U/L — ABNORMAL HIGH (ref 37–153)
Bilirubin, Direct: 8.1 mg/dL — ABNORMAL HIGH (ref 0.0–0.2)
Globulin: 2.7 g/dL (calc) (ref 1.9–3.7)
Indirect Bilirubin: 5.6 mg/dL (calc) — ABNORMAL HIGH (ref 0.2–1.2)
Total Bilirubin: 13.7 mg/dL — ABNORMAL HIGH (ref 0.2–1.2)
Total Protein: 5 g/dL — ABNORMAL LOW (ref 6.1–8.1)

## 2022-09-12 LAB — HEMOGLOBIN A1C
Hgb A1c MFr Bld: 9 % of total Hgb — ABNORMAL HIGH (ref <5.7)
Mean Plasma Glucose: 212 mg/dL
eAG (mmol/L): 11.7 mmol/L

## 2022-09-12 NOTE — Telephone Encounter (Addendum)
Tried calling Dr. Cephas Darby at [facility], LVM for nurse re:  Julie Baxter is jaundiced and her Bili is 13 up from 1.7 on 1/7 and ask if he would like to see her sooner than 1/22?   Will wait for call back?

## 2022-09-13 DIAGNOSIS — K822 Perforation of gallbladder: Secondary | ICD-10-CM | POA: Diagnosis not present

## 2022-09-13 DIAGNOSIS — K8689 Other specified diseases of pancreas: Secondary | ICD-10-CM | POA: Diagnosis not present

## 2022-09-14 ENCOUNTER — Encounter: Payer: Medicare Other | Admitting: Nurse Practitioner

## 2022-09-14 NOTE — Progress Notes (Signed)
Contacted Dr Cephas Darby at [facility] regarding these lab results on 1/16 to request he see her sooner. Julie Baxter was seen at [facility] 1/17 and has ERCP planned for 1/22.

## 2022-09-18 ENCOUNTER — Telehealth: Payer: Self-pay | Admitting: Family Medicine

## 2022-09-18 DIAGNOSIS — Z9689 Presence of other specified functional implants: Secondary | ICD-10-CM | POA: Diagnosis not present

## 2022-09-18 DIAGNOSIS — Z4659 Encounter for fitting and adjustment of other gastrointestinal appliance and device: Secondary | ICD-10-CM | POA: Diagnosis not present

## 2022-09-18 DIAGNOSIS — K8051 Calculus of bile duct without cholangitis or cholecystitis with obstruction: Secondary | ICD-10-CM | POA: Diagnosis not present

## 2022-09-18 DIAGNOSIS — Z7984 Long term (current) use of oral hypoglycemic drugs: Secondary | ICD-10-CM | POA: Diagnosis not present

## 2022-09-18 DIAGNOSIS — R748 Abnormal levels of other serum enzymes: Secondary | ICD-10-CM | POA: Diagnosis not present

## 2022-09-18 DIAGNOSIS — K831 Obstruction of bile duct: Secondary | ICD-10-CM | POA: Diagnosis not present

## 2022-09-18 DIAGNOSIS — K8050 Calculus of bile duct without cholangitis or cholecystitis without obstruction: Secondary | ICD-10-CM | POA: Diagnosis not present

## 2022-09-18 DIAGNOSIS — Z79899 Other long term (current) drug therapy: Secondary | ICD-10-CM | POA: Diagnosis not present

## 2022-09-18 DIAGNOSIS — T85590A Other mechanical complication of bile duct prosthesis, initial encounter: Secondary | ICD-10-CM | POA: Diagnosis not present

## 2022-09-18 DIAGNOSIS — E119 Type 2 diabetes mellitus without complications: Secondary | ICD-10-CM | POA: Diagnosis not present

## 2022-09-18 DIAGNOSIS — R17 Unspecified jaundice: Secondary | ICD-10-CM | POA: Diagnosis not present

## 2022-09-18 DIAGNOSIS — Z794 Long term (current) use of insulin: Secondary | ICD-10-CM | POA: Diagnosis not present

## 2022-09-18 DIAGNOSIS — K838 Other specified diseases of biliary tract: Secondary | ICD-10-CM | POA: Diagnosis not present

## 2022-09-18 NOTE — Telephone Encounter (Signed)
Left message for patient to call back and schedule Medicare Annual Wellness Visit (AWV) in office.   If not able to come in office, please offer to do virtually or by telephone.   Last AWV: 09/08/2021  Please schedule at any time with [facility]-Nurse Health Advisor.  30 minute appointment  Any questions, please contact me at [phone]   Thank you,   [facility]  Ambulatory Clinical Support for [place] [facility] ??[phone] or ??[phone]

## 2022-09-22 DIAGNOSIS — K8689 Other specified diseases of pancreas: Secondary | ICD-10-CM | POA: Diagnosis not present

## 2022-09-22 DIAGNOSIS — K822 Perforation of gallbladder: Secondary | ICD-10-CM | POA: Diagnosis not present

## 2022-09-27 ENCOUNTER — Ambulatory Visit (INDEPENDENT_AMBULATORY_CARE_PROVIDER_SITE_OTHER): Payer: Medicare Other | Admitting: Family Medicine

## 2022-09-27 ENCOUNTER — Encounter: Payer: Self-pay | Admitting: Family Medicine

## 2022-09-27 VITALS — BP 115/64 | HR 95 | Temp 98.4°F | Ht 65.0 in | Wt 162.0 lb

## 2022-09-27 DIAGNOSIS — E1121 Type 2 diabetes mellitus with diabetic nephropathy: Secondary | ICD-10-CM | POA: Diagnosis not present

## 2022-09-27 DIAGNOSIS — N184 Chronic kidney disease, stage 4 (severe): Secondary | ICD-10-CM

## 2022-09-27 DIAGNOSIS — K831 Obstruction of bile duct: Secondary | ICD-10-CM

## 2022-09-27 DIAGNOSIS — E1165 Type 2 diabetes mellitus with hyperglycemia: Secondary | ICD-10-CM | POA: Diagnosis not present

## 2022-09-27 MED ORDER — LEVEMIR FLEXPEN 100 UNIT/ML ~~LOC~~ SOPN: 16.0000 [IU] | PEN_INJECTOR | Freq: Every day | SUBCUTANEOUS | 3 refills | Status: DC

## 2022-09-27 NOTE — Progress Notes (Signed)
Acute Office Visit  Subjective:     Patient ID: Julie Baxter, female    DOB: 1936-08-07, 87 y.o.   MRN: [id number]  Chief Complaint  Patient presents with   [place] follow up/needs labs    HPI Patient is in today for follow up and labs s/p ERCP on 09/18/2022 with [facility] due to biliary obstruction. Her biliary stent was replaced. She was seen at [facility] on 09/22/22 for follow-up with improved symptoms and bilirubin levels, report states her primary goal is perc chole tube removal, but remains unclear if this will be able to be done. Julie Baxter reports increased appetite and good nutrition, decreasing output from chole tube, and improvement in stool frequency and consistency.  Her A1c this month was up to 9.0. Her Glipizide was discontinued and she is currently taking 16 units of Levemir daily. She reports fasting BG of 120s and she checks this daily.  Review of Systems  All other systems reviewed and are negative.       Objective:    BP 115/64   Pulse 95   Temp 98.4 F (36.9 C) (Oral)   Ht '5\' 5"'$  (1.651 m)   Wt 162 lb (73.5 kg)   SpO2 99%   BMI 26.96 kg/m    Physical Exam Vitals and nursing note reviewed.  Constitutional:      Appearance: Normal appearance. She is normal weight.  HENT:     Head: Normocephalic and atraumatic.  Eyes:     General: Scleral icterus present.  Cardiovascular:     Rate and Rhythm: Normal rate and regular rhythm.     Pulses: Normal pulses.     Heart sounds: Normal heart sounds.  Pulmonary:     Effort: Pulmonary effort is normal.     Breath sounds: Normal breath sounds.  Abdominal:     Comments: Right lower quadrant biliary drain present, skin clean, dry, intact surrounding drain site, output clear, yellow, approximately 38m  Musculoskeletal:     Right lower leg: 3+ Pitting Edema present.     Left lower leg: 3+ Pitting Edema present.  Skin:    General: Skin is warm and dry.      Coloration: Skin is jaundiced.  Neurological:     General: No focal deficit present.     Mental Status: She is alert and oriented to person, place, and time. Mental status is at baseline.  Psychiatric:        Mood and Affect: Mood normal.        Behavior: Behavior normal.        Thought Content: Thought content normal.        Judgment: Judgment normal.     No results found for any visits on 09/27/22.      Assessment & Plan:   Problem List Items Addressed This Visit       Endocrine   Type 2 diabetes with nephropathy (HCC)    Last A1c 9.0 this month. She has increased her Levemir to 16 units daily some days with improvement in fasting BG numbers and no hypoglycemic episodes. Will continue Levemir 16 units daily and refer to pharmacy for assistance with medication regimen given the complexity of her case.      Relevant Medications   insulin detemir (LEVEMIR FLEXPEN) 100 UNIT/ML FlexPen     Genitourinary   CKD (chronic kidney disease) stage 4, GFR 15-29 ml/min (HCC)   Other Visit Diagnoses  Biliary obstruction    -  Primary   Relevant Orders   CBC with Differential/Platelet   COMPLETE METABOLIC PANEL WITH GFR   Uncontrolled type 2 diabetes mellitus with hyperglycemia (HCC)       Relevant Medications   insulin detemir (LEVEMIR FLEXPEN) 100 UNIT/ML FlexPen   Other Relevant Orders   AMB Referral to Community Care Coordinaton (ACO Patients)   Microalbumin / creatinine urine ratio       Meds ordered this encounter  Medications   insulin detemir (LEVEMIR FLEXPEN) 100 UNIT/ML FlexPen    Sig: Inject 16 Units into the skin at bedtime.    Dispense:  15 mL    Refill:  3    Order Specific Question:   Supervising Provider    Answer:   Jenna Luo T [[id number]]    Return in about 3 months (around 12/26/2022) for diabetes.  Rubie Maid, FNP

## 2022-09-27 NOTE — Assessment & Plan Note (Signed)
Last A1c 9.0 this month. She has increased her Levemir to 16 units daily some days with improvement in fasting BG numbers and no hypoglycemic episodes. Will continue Levemir 16 units daily and refer to pharmacy for assistance with medication regimen given the complexity of her case.

## 2022-09-28 ENCOUNTER — Telehealth: Payer: Self-pay | Admitting: Family Medicine

## 2022-09-28 LAB — CBC WITH DIFFERENTIAL/PLATELET
Absolute Monocytes: 1984 cells/uL — ABNORMAL HIGH (ref 200–950)
Basophils Absolute: 80 cells/uL (ref 0–200)
Basophils Relative: 0.5 %
Eosinophils Absolute: 96 cells/uL (ref 15–500)
Eosinophils Relative: 0.6 %
HCT: 22.6 % — ABNORMAL LOW (ref 35.0–45.0)
Hemoglobin: 7.6 g/dL — ABNORMAL LOW (ref 11.7–15.5)
Lymphs Abs: 2480 cells/uL (ref 850–3900)
MCH: 32.2 pg (ref 27.0–33.0)
MCHC: 33.6 g/dL (ref 32.0–36.0)
MCV: 95.8 fL (ref 80.0–100.0)
MPV: 10.4 fL (ref 7.5–12.5)
Monocytes Relative: 12.4 %
Neutro Abs: 11360 cells/uL — ABNORMAL HIGH (ref 1500–7800)
Neutrophils Relative %: 71 %
Platelets: 387 10*3/uL (ref 140–400)
RBC: 2.36 10*6/uL — ABNORMAL LOW (ref 3.80–5.10)
RDW: 16.2 % — ABNORMAL HIGH (ref 11.0–15.0)
Total Lymphocyte: 15.5 %
WBC: 16 10*3/uL — ABNORMAL HIGH (ref 3.8–10.8)

## 2022-09-28 LAB — COMPLETE METABOLIC PANEL WITH GFR
AG Ratio: 0.7 (calc) — ABNORMAL LOW (ref 1.0–2.5)
ALT: 50 U/L — ABNORMAL HIGH (ref 6–29)
AST: 61 U/L — ABNORMAL HIGH (ref 10–35)
Albumin: 2.4 g/dL — ABNORMAL LOW (ref 3.6–5.1)
Alkaline phosphatase (APISO): 620 U/L — ABNORMAL HIGH (ref 37–153)
BUN/Creatinine Ratio: 12 (calc) (ref 6–22)
BUN: 28 mg/dL — ABNORMAL HIGH (ref 7–25)
CO2: 20 mmol/L (ref 20–32)
Calcium: 7.9 mg/dL — ABNORMAL LOW (ref 8.6–10.4)
Chloride: 109 mmol/L (ref 98–110)
Creat: 2.28 mg/dL — ABNORMAL HIGH (ref 0.60–0.95)
Globulin: 3.3 g/dL (calc) (ref 1.9–3.7)
Glucose, Bld: 204 mg/dL — ABNORMAL HIGH (ref 65–99)
Potassium: 5.7 mmol/L — ABNORMAL HIGH (ref 3.5–5.3)
Sodium: 138 mmol/L (ref 135–146)
Total Bilirubin: 4.9 mg/dL — ABNORMAL HIGH (ref 0.2–1.2)
Total Protein: 5.7 g/dL — ABNORMAL LOW (ref 6.1–8.1)
eGFR: 21 mL/min/{1.73_m2} — ABNORMAL LOW (ref >=60)

## 2022-09-28 LAB — MICROALBUMIN / CREATININE URINE RATIO
Creatinine, Urine: 95 mg/dL (ref 20–275)
Microalb Creat Ratio: 9 mcg/mg creat (ref <30)
Microalb, Ur: 0.9 mg/dL

## 2022-09-28 NOTE — Telephone Encounter (Signed)
Spoke with patient regarding her lab results. We had a discussion about her goals of care and she would like to proceed with labs and management but declines invasive measures for her possible pancreatic cancer. Regarding her elevated Potassium she would not like to take Kayexalate due to the side effects. She continues to complain of swelling in her bilateral feet, we discussed options and she will take 40 meq of lasix today and have labs redraws in 1 week. Will recheck CBC at that time as well and treat Hgb if <7.

## 2022-09-29 ENCOUNTER — Telehealth: Payer: Self-pay | Admitting: Pharmacist

## 2022-09-29 ENCOUNTER — Other Ambulatory Visit: Payer: Self-pay

## 2022-09-29 DIAGNOSIS — K8689 Other specified diseases of pancreas: Secondary | ICD-10-CM

## 2022-09-29 NOTE — Progress Notes (Unsigned)
Care Management & Coordination Services Pharmacy Team  Reason for Encounter: Appointment Reminder  Contacted patient to confirm {visittype:27222} appointment with ***, PharmD on *** at ***. {[facility] Outreach:28874}  Do you have any problems getting your medications? {yes/no:20286} If yes what types of problems are you experiencing? {Problems:27223}  What is your top health concern you would like to discuss at your upcoming visit?   Have you seen any other providers since your last visit with PCP? {yes/no:20286}   Chart review:  Recent office visits:  09/27/2022 OV (Fam Med) Rubie Maid, FNP; no medication changes indicated.  Recent consult visits:  09/22/2022 OV (Oncology) Georganna Skeans, Amy Kingston, [place]; no medication changes indicated.  09/04/2022 OV Gertie [facility]ey) [place], Phylis Bougie, MD;   Hospital visits:  09/18/2022 Surgery - ENDOSCOPIC RETROGRADE CHOLANGIOPANCREATOGRAPHY (ERCP); WITH REMOVAL OF CALCULI/DEBRIS FROM BILIARY/PANCREATIC DUCT(S)    Star Rating Drugs:     Care Gaps: Annual wellness visit in last year? {yes/no:20286}  If Diabetic: Last eye exam / retinopathy screening: Last diabetic foot exam:   ***sig

## 2022-09-30 ENCOUNTER — Emergency Department (HOSPITAL_COMMUNITY)
Admission: EM | Admit: 2022-09-30 | Discharge: 2022-09-30 | Disposition: A | Discharge status: Home / Self Care | Payer: Medicare Other | Attending: Emergency Medicine | Admitting: Emergency Medicine

## 2022-09-30 ENCOUNTER — Encounter (HOSPITAL_COMMUNITY): Payer: Self-pay | Admitting: Pharmacy Technician

## 2022-09-30 ENCOUNTER — Emergency Department (HOSPITAL_COMMUNITY): Payer: Medicare Other

## 2022-09-30 ENCOUNTER — Other Ambulatory Visit: Payer: Self-pay

## 2022-09-30 DIAGNOSIS — D72829 Elevated white blood cell count, unspecified: Secondary | ICD-10-CM | POA: Diagnosis not present

## 2022-09-30 DIAGNOSIS — R945 Abnormal results of liver function studies: Secondary | ICD-10-CM | POA: Insufficient documentation

## 2022-09-30 DIAGNOSIS — R404 Transient alteration of awareness: Secondary | ICD-10-CM

## 2022-09-30 DIAGNOSIS — Z794 Long term (current) use of insulin: Secondary | ICD-10-CM | POA: Diagnosis not present

## 2022-09-30 DIAGNOSIS — K8689 Other specified diseases of pancreas: Secondary | ICD-10-CM

## 2022-09-30 DIAGNOSIS — E119 Type 2 diabetes mellitus without complications: Secondary | ICD-10-CM | POA: Insufficient documentation

## 2022-09-30 DIAGNOSIS — I10 Essential (primary) hypertension: Secondary | ICD-10-CM | POA: Diagnosis not present

## 2022-09-30 DIAGNOSIS — R410 Disorientation, unspecified: Secondary | ICD-10-CM | POA: Diagnosis not present

## 2022-09-30 DIAGNOSIS — D649 Anemia, unspecified: Secondary | ICD-10-CM | POA: Diagnosis not present

## 2022-09-30 DIAGNOSIS — I959 Hypotension, unspecified: Secondary | ICD-10-CM | POA: Diagnosis not present

## 2022-09-30 DIAGNOSIS — K869 Disease of pancreas, unspecified: Secondary | ICD-10-CM | POA: Diagnosis not present

## 2022-09-30 DIAGNOSIS — R7989 Other specified abnormal findings of blood chemistry: Secondary | ICD-10-CM

## 2022-09-30 LAB — COMPREHENSIVE METABOLIC PANEL
ALT: 41 U/L (ref 0–44)
AST: 43 U/L — ABNORMAL HIGH (ref 15–41)
Albumin: 1.9 g/dL — ABNORMAL LOW (ref 3.5–5.0)
Alkaline Phosphatase: 481 U/L — ABNORMAL HIGH (ref 38–126)
Anion gap: 14 (ref 5–15)
BUN: 39 mg/dL — ABNORMAL HIGH (ref 8–23)
CO2: 16 mmol/L — ABNORMAL LOW (ref 22–32)
Calcium: 7.8 mg/dL — ABNORMAL LOW (ref 8.9–10.3)
Chloride: 109 mmol/L (ref 98–111)
Creatinine, Ser: 2.36 mg/dL — ABNORMAL HIGH (ref 0.44–1.00)
GFR, Estimated: 20 mL/min — ABNORMAL LOW (ref >60)
Glucose, Bld: 230 mg/dL — ABNORMAL HIGH (ref 70–99)
Potassium: 3.4 mmol/L — ABNORMAL LOW (ref 3.5–5.1)
Sodium: 139 mmol/L (ref 135–145)
Total Bilirubin: 4.9 mg/dL — ABNORMAL HIGH (ref 0.3–1.2)
Total Protein: 6.2 g/dL — ABNORMAL LOW (ref 6.5–8.1)

## 2022-09-30 LAB — CBC WITH DIFFERENTIAL/PLATELET
Abs Immature Granulocytes: 0.13 10*3/uL — ABNORMAL HIGH (ref 0.00–0.07)
Basophils Absolute: 0.1 10*3/uL (ref 0.0–0.1)
Basophils Relative: 0 %
Eosinophils Absolute: 0 10*3/uL (ref 0.0–0.5)
Eosinophils Relative: 0 %
HCT: 25.9 % — ABNORMAL LOW (ref 36.0–46.0)
Hemoglobin: 8 g/dL — ABNORMAL LOW (ref 12.0–15.0)
Immature Granulocytes: 1 %
Lymphocytes Relative: 9 %
Lymphs Abs: 1.4 10*3/uL (ref 0.7–4.0)
MCH: 31.9 pg (ref 26.0–34.0)
MCHC: 30.9 g/dL (ref 30.0–36.0)
MCV: 103.2 fL — ABNORMAL HIGH (ref 80.0–100.0)
Monocytes Absolute: 1.7 10*3/uL — ABNORMAL HIGH (ref 0.1–1.0)
Monocytes Relative: 11 %
Neutro Abs: 12.6 10*3/uL — ABNORMAL HIGH (ref 1.7–7.7)
Neutrophils Relative %: 79 %
Platelets: 344 10*3/uL (ref 150–400)
RBC: 2.51 MIL/uL — ABNORMAL LOW (ref 3.87–5.11)
RDW: 20.1 % — ABNORMAL HIGH (ref 11.5–15.5)
WBC: 15.9 10*3/uL — ABNORMAL HIGH (ref 4.0–10.5)
nRBC: 0 % (ref 0.0–0.2)

## 2022-09-30 LAB — LACTIC ACID, PLASMA: Lactic Acid, Venous: 2.4 mmol/L (ref 0.5–1.9)

## 2022-09-30 LAB — AMMONIA: Ammonia: <10 umol/L (ref 9–35)

## 2022-09-30 LAB — URINALYSIS, ROUTINE W REFLEX MICROSCOPIC
Bilirubin Urine: NEGATIVE
Glucose, UA: NEGATIVE mg/dL
Hgb urine dipstick: NEGATIVE
Ketones, ur: NEGATIVE mg/dL
Leukocytes,Ua: NEGATIVE
Nitrite: NEGATIVE
Protein, ur: NEGATIVE mg/dL
Specific Gravity, Urine: 1.009 (ref 1.005–1.030)
pH: 5 (ref 5.0–8.0)

## 2022-09-30 LAB — PREPARE RBC (CROSSMATCH)

## 2022-09-30 LAB — CBG MONITORING, ED: Glucose-Capillary: 220 mg/dL — ABNORMAL HIGH (ref 70–99)

## 2022-09-30 LAB — PROTIME-INR
INR: 1.1 (ref 0.8–1.2)
Prothrombin Time: 13.9 seconds (ref 11.4–15.2)

## 2022-09-30 MED ORDER — SODIUM CHLORIDE 0.9% IV SOLUTION
Freq: Once | INTRAVENOUS | Status: AC
  Administered 2022-09-30: 10 mL/h via INTRAVENOUS

## 2022-09-30 MED ORDER — SODIUM CHLORIDE 0.9 % IV BOLUS
500.0000 mL | Freq: Once | INTRAVENOUS | Status: AC
  Administered 2022-09-30: 500 mL via INTRAVENOUS

## 2022-09-30 NOTE — Discharge Instructions (Signed)
You are seen in the emergency department for evaluation of weakness and some possible confusion.  Your lab counts were fairly stable from recent blood work last week.  You were given a unit of packed red blood cells for anemia.  Please continue your regular medications and follow-up with your primary care doctor and your treatment team at [facility].  Return to the emergency department if any worsening or concerning symptoms.

## 2022-09-30 NOTE — ED Triage Notes (Signed)
Pt bib ems with reports of confusion worsening over the last few days. Pt also reports pt is pale in color and hx of anemia. Given 300cc NS en route. 116/70 HR 100 98% RA CBG 198

## 2022-09-30 NOTE — ED Provider Notes (Signed)
[facility] Provider Note   CSN: [id number] Arrival date & time: 09/30/22  1026     History  Chief Complaint  Patient presents with   Altered Mental Status    Julie Baxter is a 87 y.o. female.  She is brought in from home for possible confusion.  She has a history of diabetes and liver disease, just discharged from [facility] after Susan Moore tube exchange.  History of pancreatic mass and rupture of gallbladder.  Patient denies any chest pain shortness of breath abdominal pain vomiting diarrhea.  She said she went to sleep last night at 3 PM and did not wake up until this morning.  Patient states she thinks she is here because her blood count is low.  The history is provided by the patient and the EMS personnel.  Altered Mental Status Presenting symptoms: confusion   Context: recent illness   Associated symptoms: no abdominal pain, no difficulty breathing, no fever, no nausea and no vomiting        Home Medications Prior to Admission medications   Medication Sig Start Date End Date Taking? Authorizing Provider  BD POSIFLUSH 0.9 % SOLN injection USE AS DIRECTED. 07/14/22   Rubie Maid, FNP  Blood Glucose Monitoring Suppl (ACCU-CHEK GUIDE ME) w/Device KIT 1 kit by Does not apply route as directed. ACCU CHEK GUIDE ME TEST STRIPS WITH KIT 180 strips, with 4 refills. 01/25/22   Lauree Chandler, NP  glucose blood (ACCU-CHEK GUIDE) test strip Use to test blood sugar twice daily. Dx: E11.22 02/13/22   Lauree Chandler, NP  insulin detemir (LEVEMIR FLEXPEN) 100 UNIT/ML FlexPen Inject 16 Units into the skin at bedtime. 09/27/22   Rubie Maid, FNP  Insulin Pen Needle (B-D UF III MINI PEN NEEDLES) 31G X 5 MM MISC USE WITH INJECTION AT BEDTIME 09/08/22   Susy Frizzle, MD  Pancrelipase, Lip-Prot-Amyl, (ZENPEP) 40000-126000 units CPEP Two capsules with first bite of meal, 1 capsule with first bite of snack. Up to 8 total daily. Patient not  taking: Reported on 09/27/2022 08/25/22   Mahala Menghini, PA-C  Propylene Glycol (SYSTANE BALANCE OP) Apply 1 drop to eye daily as needed (dry eyes).    [provider]  simvastatin (ZOCOR) 40 MG tablet Take 1 tablet (40 mg total) by mouth daily. Hold medication until follow-up with PCP. Patient not taking: Reported on 09/27/2022 09/03/22   Barton Dubois, MD  sodium bicarbonate 650 MG tablet Take 1 tablet by mouth 3 (three) times daily. 02/01/22   [provider]      Allergies    Elemental sulfur, Naproxen, and Tramadol    Review of Systems   Review of Systems  Constitutional:  Negative for fever.  Gastrointestinal:  Negative for abdominal pain, nausea and vomiting.  Psychiatric/Behavioral:  Positive for confusion.     Physical Exam Updated Vital Signs BP 132/60   Pulse 96   Temp 97.6 F (36.4 C)   Resp 17   SpO2 100%  Physical Exam Vitals and nursing note reviewed.  Constitutional:      General: She is not in acute distress.    Appearance: Normal appearance. She is well-developed.  HENT:     Head: Normocephalic and atraumatic.  Eyes:     Conjunctiva/sclera: Conjunctivae normal.  Cardiovascular:     Rate and Rhythm: Normal rate and regular rhythm.     Heart sounds: No murmur heard. Pulmonary:     Effort: Pulmonary  effort is normal. No respiratory distress.     Breath sounds: Normal breath sounds.  Abdominal:     Palpations: Abdomen is soft.     Tenderness: There is no abdominal tenderness. There is no guarding or rebound.     Comments: She has a drain in place in her right upper lateral abdomen.  Musculoskeletal:        General: No deformity.     Cervical back: Neck supple.     Right lower leg: No edema.     Left lower leg: No edema.  Skin:    General: Skin is warm and dry.     Capillary Refill: Capillary refill takes less than 2 seconds.  Neurological:     General: No focal deficit present.     Mental Status: She is alert and oriented to person,  place, and time.     Sensory: No sensory deficit.     Motor: No weakness.     ED Results / Procedures / Treatments   Labs (all labs ordered are listed, but only abnormal results are displayed) Labs Reviewed  CBC WITH DIFFERENTIAL/PLATELET - Abnormal; Notable for the following components:      Result Value   WBC 15.9 (*)    RBC 2.51 (*)    Hemoglobin 8.0 (*)    HCT 25.9 (*)    MCV 103.2 (*)    RDW 20.1 (*)    Neutro Abs 12.6 (*)    Monocytes Absolute 1.7 (*)    Abs Immature Granulocytes 0.13 (*)    All other components within normal limits  COMPREHENSIVE METABOLIC PANEL - Abnormal; Notable for the following components:   Potassium 3.4 (*)    CO2 16 (*)    Glucose, Bld 230 (*)    BUN 39 (*)    Creatinine, Ser 2.36 (*)    Calcium 7.8 (*)    Total Protein 6.2 (*)    Albumin 1.9 (*)    AST 43 (*)    Alkaline Phosphatase 481 (*)    Total Bilirubin 4.9 (*)    GFR, Estimated 20 (*)    All other components within normal limits  LACTIC ACID, PLASMA - Abnormal; Notable for the following components:   Lactic Acid, Venous 2.4 (*)    All other components within normal limits  CBG MONITORING, ED - Abnormal; Notable for the following components:   Glucose-Capillary 220 (*)    All other components within normal limits  URINALYSIS, ROUTINE W REFLEX MICROSCOPIC  PROTIME-INR  AMMONIA  TYPE AND SCREEN  PREPARE RBC (CROSSMATCH)    EKG EKG Interpretation  Date/Time:  Saturday September 30 2022 10:38:25 EST Ventricular Rate:  96 PR Interval:  144 QRS Duration: 88 QT Interval:  368 QTC Calculation: 465 R Axis:   -19 Text Interpretation: Sinus rhythm Inferior infarct, age indeterminate Low voltage QRS No significant change since prior 1/24 Confirmed by Aletta Edouard [phone]) on 09/30/2022 11:02:29 AM  Radiology DG Chest Port 1 View  Result Date: 09/30/2022 CLINICAL DATA:  Altered mental status EXAM: PORTABLE CHEST 1 VIEW COMPARISON:  09/01/2022 FINDINGS: Numerous leads and wires  project over the chest. Midline trachea. Normal heart size. Atherosclerosis in the transverse aorta. No pleural effusion or pneumothorax. Clear lungs. IMPRESSION: No acute cardiopulmonary disease. Aortic Atherosclerosis (ICD10-I70.0). Electronically Signed   By: Abigail Miyamoto M.D.   On: 09/30/2022 11:07    Procedures Procedures    Medications Ordered in ED Medications  sodium chloride 0.9 % bolus 500 mL (0  mLs Intravenous Stopped 09/30/22 1240)  0.9 %  sodium chloride infusion (Manually program via Guardrails IV Fluids) (10 mL/hr Intravenous New Bag/Given 09/30/22 1644)    ED Course/ Medical Decision Making/ A&P Clinical Course as of 09/30/22 1715  Sat Sep 30, 2022  1103 Chest x-ray interpreted by me as no acute infiltrates.  Awaiting radiology reading. [MB]  [place] Patient's son is here.  He feels she is at her baseline mental status.  He does say she has been weak and is was to get a transfusion on Monday and was wondering if we can give her a unit of blood now.  I reviewed the rest of her labs with him.  Do not see a definite need for admission at this time.  Have ordered a unit of blood. [MB]    Clinical Course User Index [MB] Hayden Rasmussen, MD                             Medical Decision Making Amount and/or Complexity of Data Reviewed Labs: ordered. Radiology: ordered.  Risk Prescription drug management.   This patient complains of general weakness possible confusion; this involves an extensive number of treatment Options and is a complaint that carries with it a high risk of complications and morbidity. The differential includes sepsis, Sirs, encephalopathy, anemia, dehydration  I ordered, reviewed and interpreted labs, which included CBC with elevated white count hemoglobin low similar to priors, chemistries with chronic CKD worsening bicarb elevated LFTs level stable from priors, lactate elevated, ammonia normal, urine without infection I ordered medication IV fluids,  transfusion of packed red blood cells and reviewed PMP when indicated. I ordered imaging studies which included chest x-ray and I independently    visualized and interpreted imaging which showed no acute findings Additional history obtained from patient's son Previous records obtained and reviewed in epic including recent discharge summary from [facility] and PCP notes Cardiac monitoring reviewed, normal sinus rhythm Social determinants considered, no significant barriers Critical Interventions: None  After the interventions stated above, I reevaluated the patient and found patient to be in no distress hemodynamically stable Admission and further testing considered, will transfuse with packed red blood cells.  She has underlying pancreatic mass and liver disease that appear stable at this time.  Signed out to Dr. Matilde Sprang to follow-up on response to transfusion.  May still require admission or transfer to [facility] but I think if she is feeling improved she could be discharged with close follow-up with outpatient treatment team.         Final Clinical Impression(s) / ED Diagnoses Final diagnoses:  Pancreatic mass  Elevated LFTs  Symptomatic anemia    Rx / DC Orders ED Discharge Orders     None         Hayden Rasmussen, MD 09/30/22 1718

## 2022-10-01 LAB — BPAM RBC
Blood Product Expiration Date: 202402172359
ISSUE DATE / TIME: 202402031641
Unit Type and Rh: 600

## 2022-10-01 LAB — TYPE AND SCREEN
ABO/RH(D): A NEG
Antibody Screen: NEGATIVE
Unit division: 0

## 2022-10-01 NOTE — ED Provider Notes (Signed)
  Physical Exam  BP 117/63   Pulse 90   Temp 97.8 F (36.6 C) (Oral)   Resp 20   SpO2 100%   Physical Exam Vitals and nursing note reviewed.  Constitutional:      General: She is not in acute distress.    Appearance: She is well-developed.  HENT:     Head: Normocephalic and atraumatic.  Eyes:     Conjunctiva/sclera: Conjunctivae normal.  Cardiovascular:     Rate and Rhythm: Normal rate and regular rhythm.     Heart sounds: No murmur heard. Pulmonary:     Effort: Pulmonary effort is normal. No respiratory distress.  Musculoskeletal:        General: No swelling.     Cervical back: Neck supple.  Skin:    General: Skin is warm and dry.     Capillary Refill: Capillary refill takes less than 2 seconds.  Neurological:     Mental Status: She is alert.  Psychiatric:        Mood and Affect: Mood normal.     Procedures  Procedures  ED Course / MDM   Clinical Course as of 10/01/22 1340  Sat Sep 30, 2022  1103 Chest x-ray interpreted by me as no acute infiltrates.  Awaiting radiology reading. [MB]  [place] Patient's son is here.  He feels she is at her baseline mental status.  He does say she has been weak and is was to get a transfusion on Monday and was wondering if we can give her a unit of blood now.  I reviewed the rest of her labs with him.  Do not see a definite need for admission at this time.  Have ordered a unit of blood. [MB]    Clinical Course User Index [MB] Hayden Rasmussen, MD   Medical Decision Making Amount and/or Complexity of Data Reviewed Labs: ordered. Radiology: ordered.  Risk Prescription drug management.   Patient received an handoff.  Transient alteration of awareness with possible symptomatic anemia pending blood transfusion.  Patient received 1 unit of blood and on reevaluation, patient appears to have returned to normal mental status baseline.  Patient story may be consistent with sundowning given the fact that she went to sleep early and altered  her sleep schedule and this episode of transient alteration of awareness occurred in the middle of the night.  Her labs appear to be improving from previous and she has follow-up with her [facility] in 5 days.  I shared decision making with the patient's family and we agreed that the patient has improved and she will return to the emergency department of these episodes of confusion continue to occur.  At this time, patient safe for discharge with outpatient follow-up       Teressa Lower, MD 10/01/22 [phone]

## 2022-10-02 ENCOUNTER — Encounter (HOSPITAL_COMMUNITY)
Admission: RE | Admit: 2022-10-02 | Discharge: 2022-10-02 | Disposition: A | Discharge status: Home / Self Care | Payer: Medicare Other | Source: Ambulatory Visit | Attending: Family Medicine | Admitting: Family Medicine

## 2022-10-02 ENCOUNTER — Telehealth: Payer: Self-pay

## 2022-10-02 ENCOUNTER — Other Ambulatory Visit: Payer: Self-pay

## 2022-10-02 DIAGNOSIS — N184 Chronic kidney disease, stage 4 (severe): Secondary | ICD-10-CM

## 2022-10-02 DIAGNOSIS — D6489 Other specified anemias: Secondary | ICD-10-CM

## 2022-10-02 NOTE — Telephone Encounter (Signed)
Pt's daughter called and asks if pt's blood work this week can be cancelled since the patient has an appointment at [facility] on Wednesday? Thank you.

## 2022-10-03 ENCOUNTER — Ambulatory Visit: Payer: Medicare Other | Admitting: Pharmacist

## 2022-10-03 NOTE — Progress Notes (Signed)
Care Management & Coordination Services Pharmacy Note  10/03/2022 Name:  Julie Baxter MRN:  [id number] DOB:  1935-12-06  Summary: Initial visit with PharmD.  Patient reports glucose 300-400s to me today.  PCP visit last week glucose was 120 fasting per her reports.  She took a dose of Tradjenta from old stock today due to elevated glucose.  I would not recommend due to increased pancreatitis risk with current mass.  I am concerned for glucose toxicity, she was also previously on glipizide but stopped due to hypo approx 1 month ago in hospital.  Recommendations/Changes made from today's visit: I think she needs something at least to decrease her severely elevated glucose.  Consider Glipizide one-half tablet daily with largest meal.  We can call to check on her sugars and adjust as needed.  Follow up plan: FU 2-3 days with me to check on sugars   Subjective: Julie Baxter is an 87 y.o. year old female who is a primary patient of Julie Maid, FNP.  The care coordination team was consulted for assistance with disease management and care coordination needs.    Engaged with patient by telephone for initial visit.  Recent office visits:  09/27/2022 OV (Fam Med) Mila Merry S, FNP; no medication changes indicated.   07/03/2022 OV (Fam Med) Julie Maid, FNP; no medication changes indicated.   06/06/2022 OV (Fam Med) Julie Maid, FNP; no medication changes indicated.   Recent consult visits:  09/22/2022 OV (Oncology) Georganna Skeans, Amy Palominas, [place]; no medication changes indicated.   09/04/2022 OV (Gastro) Jowell, Phylis Bougie, MD; no medication changes indicated.    08/12/2022 OV (Nephrology) Theador Hawthorne, Manpreet S, MBBS; no medication changes indicated.   06/30/2022 OV Gertie Fey) Mahala Menghini, PA-C; no medication changes indicated.   Hospital visits:  09/30/2022 Pancreatic Mass -No medication changes noted.   09/18/2022 Admission for Surgery - ENDOSCOPIC RETROGRADE  CHOLANGIOPANCREATOGRAPHY (ERCP); WITH REMOVAL OF CALCULI/DEBRIS FROM BILIARY/PANCREATIC DUCT(S)    09/01/2022 ED to Hospital Admission due to Nausea and Vomiting -Antibiotics transition to oral Augmentin twice a day (dose adjusted for renal function) and instructions to follow-up with PCP/[facility] at discharge  -Given the finding episodes of hypoglycemia while inpatient tradjenta and glipizide were kept on hold at discharge until follow up with PCP.   05/27/2022 Admission for Surgery - ERCP; WITH PLACEMENT OF ENDOSCOPIC STENT INTO BILIARY / PANCREATIC DUCT, INCLUDING PRE- AND POST-DILATION AND GUIDE WIRE PASSAGE, WHEN PERFORMED, INCLUDING SPHINCTEROTOMY, WHEN PERFORMED, EACH STENT.   05/26/2022 ED visit for Pancreatic Tumor -No medication changes noted    Objective:  Lab Results  Component Value Date   CREATININE 2.36 (H) 09/30/2022   BUN 39 (H) 09/30/2022   EGFR 21 (L) 09/27/2022   GFRNONAA 20 (L) 09/30/2022   GFRAA 34 (L) 10/06/2020   NA 139 09/30/2022   K 3.4 (L) 09/30/2022   CALCIUM 7.8 (L) 09/30/2022   CO2 16 (L) 09/30/2022   GLUCOSE 230 (H) 09/30/2022    Lab Results  Component Value Date/Time   HGBA1C 9.0 (H) 09/11/2022 08:36 AM   HGBA1C 9.1 (H) 08/03/2022 03:16 PM   MICROALBUR 0.9 09/27/2022 03:36 PM    Last diabetic Eye exam:  Lab Results  Component Value Date/Time   HMDIABEYEEXA Retinopathy (A) 10/23/2017 09:15 AM    Last diabetic Foot exam: No results found for: "HMDIABFOOTEX"   Lab Results  Component Value Date   CHOL 103 06/06/2022   HDL 34 (L) 06/06/2022   LDLCALC 50 06/06/2022  TRIG 111 06/06/2022   CHOLHDL 3.0 06/06/2022       Latest Ref Rng & Units 09/30/2022   11:09 AM 09/27/2022   11:37 AM 09/11/2022    8:36 AM  Hepatic Function  Total Protein 6.5 - 8.1 g/dL 6.2  5.7  5.0    5.0   Albumin 3.5 - 5.0 g/dL 1.9     AST 15 - 41 U/L 43  61  174    174   ALT 0 - 44 U/L 41  50  146    146   Alk Phosphatase 38 - 126 U/L 481     Total Bilirubin  0.3 - 1.2 mg/dL 4.9  4.9  13.7    13.7    13.1   Bilirubin, Direct 0.0 - 0.2 mg/dL 0.0 - 0.2 mg/dL   8.1    7.6     Lab Results  Component Value Date/Time   TSH 1.37 05/26/2022 09:19 AM   TSH 1.08 11/11/2021 10:56 AM       Latest Ref Rng & Units 09/30/2022   11:09 AM 09/27/2022   11:37 AM 09/11/2022    8:36 AM  CBC  WBC 4.0 - 10.5 K/uL 15.9  16.0  13.0   Hemoglobin 12.0 - 15.0 g/dL 8.0  7.6  9.7   Hematocrit 36.0 - 46.0 % 25.9  22.6  28.9   Platelets 150 - 400 K/uL 344  387  281     Lab Results  Component Value Date/Time   VD25OH 18 (L) 06/06/2022 11:00 AM    Clinical ASCVD: No  The ASCVD Risk score (Arnett DK, et al., 2019) failed to calculate for the following reasons:   The 2019 ASCVD risk score is only valid for ages 39 to 24        07/03/2022   10:10 AM 09/08/2021    2:41 PM 10/11/2020    9:52 AM  Depression screen PHQ 2/9  Decreased Interest 0 0 0  Down, Depressed, Hopeless 0 0 0  PHQ - 2 Score 0 0 0  Altered sleeping 0    Tired, decreased energy 0    Change in appetite 0    Feeling bad or failure about yourself  0    Trouble concentrating 0    Moving slowly or fidgety/restless 0    Suicidal thoughts 0    PHQ-9 Score 0    Difficult doing work/chores Not difficult at all       Social History   Tobacco Use  Smoking Status Never  Smokeless Tobacco Never   BP Readings from Last 3 Encounters:  09/30/22 117/63  09/27/22 115/64  09/03/22 (!) 157/84   Pulse Readings from Last 3 Encounters:  09/30/22 90  09/27/22 95  09/03/22 81   Wt Readings from Last 3 Encounters:  09/27/22 162 lb (73.5 kg)  09/01/22 144 lb 13.5 oz (65.7 kg)  07/03/22 145 lb (65.8 kg)   BMI Readings from Last 3 Encounters:  09/27/22 26.96 kg/m  09/01/22 24.10 kg/m  07/03/22 24.13 kg/m    Allergies  Allergen Reactions   Elemental Sulfur Hives   Naproxen Swelling and Other (See Comments)    Blacked out - Face swelling    Tramadol Nausea And Vomiting    Medications  Reviewed Today     Reviewed by Shana Chute, CPhT (Pharmacy Technician) on 09/30/22 at 1512  Med List Status: Complete   Medication Order Taking? Sig Documenting Provider Last Dose Status Informant  BD POSIFLUSH  0.9 % SOLN injection [id number]  USE AS DIRECTED. Julie Maid, FNP  Active Self  Blood Glucose Monitoring Suppl (ACCU-CHEK GUIDE ME) w/Device KIT [id number]  1 kit by Does not apply route as directed. ACCU CHEK GUIDE ME TEST STRIPS WITH KIT 180 strips, with 4 refills. Lauree Chandler, NP  Active Self  ciprofloxacin (CIPRO) 500 MG tablet [id number] No Take 500 mg by mouth daily. Starting 01.22.2024 x 5 days. [provider] 09/23/2022 Active Self           Med Note Ahmed Prima, AMBER C   Sat Sep 30, 2022  3:12 PM) Completed course  glucose blood (ACCU-CHEK GUIDE) test strip [id number]  Use to test blood sugar twice daily. Dx: E11.22 Lauree Chandler, NP  Active Self  insulin detemir (LEVEMIR FLEXPEN) 100 UNIT/ML FlexPen [id number] Yes Inject 16 Units into the skin at bedtime. Julie Baxter, [place] 09/29/2022 Active Self  Insulin Pen Needle (B-D UF III MINI PEN NEEDLES) 31G X 5 MM MISC [id number]  USE WITH INJECTION AT BEDTIME Susy Frizzle, MD  Active Self  Propylene Glycol (SYSTANE BALANCE OP) [id number] Yes Apply 1 drop to eye daily as needed (dry eyes). [provider]  Active Self  traMADol (ULTRAM) 50 MG tablet [id number] Yes Take 50 mg by mouth daily. [provider]  Active Self            SDOH:  (Social Determinants of Health) assessments and interventions performed: Yes Financial Resource Strain: Not on file   Food Insecurity: No Food Insecurity (09/05/2022)   Hunger Vital Sign    Worried About Running Out of Food in the Last Year: Never true    Ran Out of Food in the Last Year: Never true    SDOH Interventions    Flowsheet Row Telephone from 09/05/2022 in [place] [facility] Telephone from 06/02/2022 in [place]  SDOH Interventions    Food Insecurity Interventions Intervention Not Indicated Intervention Not Indicated  Transportation Interventions Intervention Not Indicated Intervention Not Indicated       Medication Assistance: None required.  Patient affirms current coverage meets needs.  Medication Access: Within the past 30 days, how often has patient missed a dose of medication? 0 Is a pillbox or other method used to improve adherence? Yes  Factors that may affect medication adherence? no barriers identified Are meds synced by current pharmacy? No  Are meds delivered by current pharmacy? No  Does patient experience delays in picking up medications due to transportation concerns? No   [facility] Reviewed: Is patient disadvantaged to use [facility]?: No  Current Rx insurance plan: [employer] Name and location of Current pharmacy:  [place], [address] [place] [address] Phone: [phone] Fax: [place], [place] [address] Phone: [phone] Fax: [place], [place] [address] [place] [address] Phone: [phone] Fax: [phone]  [id number] [address] [facility] [address]: [phone]: [phone] [facility] services reviewed with patient today?: Yes  Patient requests to transfer care to [facility]?: No  Reason patient declined to change pharmacies: Loyalty to other pharmacy/Patient preference  Compliance/Adherence/Medication fill history: [facility] Drugs:  Glipizide 5 mg last filled 07/22/2022 100 DS Levemir Flexpen last filled 09/27/2022 28 DS Tradjenta 5  mg last filled 08/29/2022 30 DS Simvastatin 40 mg last filled 08/06/2022 100 DS   Care Gaps: Annual wellness visit in last year? No   If Diabetic: Last eye exam / retinopathy screening: 10/23/2017 Last diabetic foot exam: 11/11/2021   Assessment/Plan   Hypertension (BP goal <130/80) -Controlled -Current treatment: None at this time -Medications previously tried: atenolol, losartan  -Current home readings: controlled at home, controlled in office -Current dietary habits: eating much better now that she is out of the hospital. -Current exercise habits: minimal, a little weak due to needing blood transfusions -Denies hypotensive/hypertensive symptoms -Educated on BP goals and benefits of medications for prevention of heart attack, stroke and kidney damage; Daily salt intake goal < 2300 mg; Exercise goal of 150 minutes per week; -Counseled to monitor BP at home if able, document, and provide log at future appointments -Recommended continue current management  Hyperlipidemia: (LDL goal < 70) -Controlled -Current treatment: None -Medications previously tried: simvastatin (renal function)  -Current dietary patterns: see DM -Current exercise habits: see DM -Educated on Cholesterol goals;  Benefits of statin for ASCVD risk reduction; Importance of limiting foods high in cholesterol; - Continue to keep LDL < 70.  Technically should be on statin with diabetes.  At this time will hold, recheck lipids at next complete phsyical.  If she needs a statin we could start atorvastatin '20mg'$  due to no need for renal adjustments.  Diabetes (A1c goal <8%) -Uncontrolled, most recent A1c 9.0 -Current medications: Levemir 100units/ml 14 units Appropriate, Query effective,  -Medications previously tried: Tradjenta, glipizide  -Current home glucose readings fasting glucose: 339 this morning, has had past of 400 post prandial glucose: unclear -Denies hypoglycemic/hyperglycemic symptoms -Current meal  patterns:  breakfast: sausage biscuit, activia, hash brown  lunch: half carton of pimiento cheese, activia, fruit  dinner: wonton soup, activia snacks:  drinks: sprite zero -Current exercise: minimal, until she gets next unit of blood -Educated on A1c and blood sugar goals; Complications of diabetes including kidney damage, retinal damage, and cardiovascular disease; Proper insulin injection technique; Prevention and management of hypoglycemic episodes; Benefits of routine self-monitoring of blood sugar; -Counseled to check feet daily and get yearly eye exams -Took a dose of Tradjenta '5mg'$  this morning and 14 units of insulin. - Today she tells me that her blood sugars are running very high.  This morning her fasting sugar was 339 and has seen readings > 400 recently.  I am concerned for DKA.  She took a dose of Tradjenta from old stock this morning due to high readings.  I would not recommend continuation of this due to pancreatic issues she has.  Two options:  bolus meal time insulin or low dose  glipizide.  At this time I feel the best option would be glipizide 2.'5mg'$  once daily with her largest meal.  She will need to return for evaluation.  CMA to check in a few days on glucose levels with glipizide.  She also goes to [facility] tomorrow so we will know POC glucose at that time. Consulted with Mila Merry PCP to determine best course of action.   Pancreatic Mass  -Not ideally controlled -Current treatment  None -Medications previously tried: none -Pancreatic mass  that is possible malignancy.  She has declined invasive procedures on this - Consider investigative procedures on pancreatic mass if patient is agreeable.  Beverly Milch, PharmD, CPP Clinical Pharmacist Practitioner [place] [phone]

## 2022-10-04 ENCOUNTER — Encounter: Payer: Self-pay | Admitting: Family Medicine

## 2022-10-04 ENCOUNTER — Other Ambulatory Visit: Payer: Self-pay

## 2022-10-04 ENCOUNTER — Other Ambulatory Visit: Payer: Medicare Other

## 2022-10-04 DIAGNOSIS — K822 Perforation of gallbladder: Secondary | ICD-10-CM | POA: Diagnosis not present

## 2022-10-04 DIAGNOSIS — K8689 Other specified diseases of pancreas: Secondary | ICD-10-CM | POA: Diagnosis not present

## 2022-10-04 DIAGNOSIS — Z742 Need for assistance at home and no other household member able to render care: Secondary | ICD-10-CM

## 2022-10-05 ENCOUNTER — Encounter: Payer: Self-pay | Admitting: Family Medicine

## 2022-10-05 ENCOUNTER — Encounter (HOSPITAL_COMMUNITY)
Admission: RE | Admit: 2022-10-05 | Discharge: 2022-10-05 | Disposition: A | Discharge status: Home / Self Care | Payer: Medicare Other | Source: Ambulatory Visit | Attending: Family Medicine | Admitting: Family Medicine

## 2022-10-05 ENCOUNTER — Telehealth: Payer: Self-pay

## 2022-10-05 NOTE — Telephone Encounter (Signed)
Amy,PA w/[facility] called today stated that pt has strongly stressed to her that pt don't want to pursue treatment for illness, minus some blood work here at [facility]. However, pt is requesting for for Palliative Care and transitioning into hospice care.   Per Amy,PA, will start looking for a Palliative Care service per pt request because pt pushing to have this done as soon as possible.   Any questions pls call

## 2022-10-06 ENCOUNTER — Ambulatory Visit: Payer: Medicare Other | Admitting: Pharmacist

## 2022-10-06 NOTE — Progress Notes (Signed)
Care Management & Coordination Services Pharmacy Note  10/06/2022 Name:  Julie Baxter MRN:  [id number] DOB:  09-06-1935  Summary: Fasting sugars have improved with addition of glipizide.  She was counseled on avoiding hypoglycemia.  Patient seems able to self manage her glucose at this time.  Daughter is there to provide care.  Recommendations/Changes made from today'Baxter visit: No changes - goal to keep sugars around 200 or lower.  Follow up plan: CMA to check next month on glucose   Subjective: Julie Baxter is an 87 y.o. year old female who is a primary patient of Julie Maid, FNP.  The care coordination team was consulted for assistance with disease management and care coordination needs.    Engaged with patient by telephone for initial visit.  Recent office visits:  09/27/2022 OV (Fam Med) Julie Merry S, FNP; no medication changes indicated.   07/03/2022 OV (Fam Med) Julie Maid, FNP; no medication changes indicated.   06/06/2022 OV (Fam Med) Julie Maid, FNP; no medication changes indicated.   Recent consult visits:  09/22/2022 OV (Oncology) Julie Baxter, Julie Baxter, [place]; no medication changes indicated.   09/04/2022 OV (Gastro) Julie Baxter, Julie Bougie, MD; no medication changes indicated.    08/12/2022 OV (Nephrology) Julie Baxter, Julie Baxter, MBBS; no medication changes indicated.   06/30/2022 OV Julie Baxter) Julie Menghini, PA-C; no medication changes indicated.   Hospital visits:  09/30/2022 Pancreatic Mass -No medication changes noted.   09/18/2022 Admission for Surgery - ENDOSCOPIC RETROGRADE CHOLANGIOPANCREATOGRAPHY (ERCP); WITH REMOVAL OF CALCULI/DEBRIS FROM BILIARY/PANCREATIC DUCT(Baxter)    09/01/2022 ED to Hospital Admission due to Nausea and Vomiting -Antibiotics transition to oral Augmentin twice a day (dose adjusted for renal function) and instructions to follow-up with PCP/[facility] at discharge  -Given the finding episodes of hypoglycemia while  inpatient tradjenta and glipizide were kept on hold at discharge until follow up with PCP.   05/27/2022 Admission for Surgery - ERCP; WITH PLACEMENT OF ENDOSCOPIC STENT INTO BILIARY / PANCREATIC DUCT, INCLUDING PRE- AND POST-DILATION AND GUIDE WIRE PASSAGE, WHEN PERFORMED, INCLUDING SPHINCTEROTOMY, WHEN PERFORMED, EACH STENT.   05/26/2022 ED visit for Pancreatic Tumor -No medication changes noted    Objective:  Lab Results  Component Value Date   CREATININE 2.36 (H) 09/30/2022   BUN 39 (H) 09/30/2022   EGFR 21 (L) 09/27/2022   GFRNONAA 20 (L) 09/30/2022   GFRAA 34 (L) 10/06/2020   NA 139 09/30/2022   K 3.4 (L) 09/30/2022   CALCIUM 7.8 (L) 09/30/2022   CO2 16 (L) 09/30/2022   GLUCOSE 230 (H) 09/30/2022    Lab Results  Component Value Date/Time   HGBA1C 9.0 (H) 09/11/2022 08:36 AM   HGBA1C 9.1 (H) 08/03/2022 03:16 PM   MICROALBUR 0.9 09/27/2022 03:36 PM    Last diabetic Eye exam:  Lab Results  Component Value Date/Time   HMDIABEYEEXA Retinopathy (A) 10/23/2017 09:15 AM    Last diabetic Foot exam: No results found for: "HMDIABFOOTEX"   Lab Results  Component Value Date   CHOL 103 06/06/2022   HDL 34 (L) 06/06/2022   LDLCALC 50 06/06/2022   TRIG 111 06/06/2022   CHOLHDL 3.0 06/06/2022       Latest Ref Rng & Units 09/30/2022   11:09 AM 09/27/2022   11:37 AM 09/11/2022    8:36 AM  Hepatic Function  Total Protein 6.5 - 8.1 g/dL 6.2  5.7  5.0    5.0   Albumin 3.5 - 5.0 g/dL 1.9     AST  15 - 41 U/L 43  61  174    174   ALT 0 - 44 U/L 41  50  146    146   Alk Phosphatase 38 - 126 U/L 481     Total Bilirubin 0.3 - 1.2 mg/dL 4.9  4.9  13.7    13.7    13.1   Bilirubin, Direct 0.0 - 0.2 mg/dL 0.0 - 0.2 mg/dL   8.1    7.6     Lab Results  Component Value Date/Time   TSH 1.37 05/26/2022 09:19 AM   TSH 1.08 11/11/2021 10:56 AM       Latest Ref Rng & Units 09/30/2022   11:09 AM 09/27/2022   11:37 AM 09/11/2022    8:36 AM  CBC  WBC 4.0 - 10.5 K/uL 15.9  16.0  13.0    Hemoglobin 12.0 - 15.0 g/dL 8.0  7.6  9.7   Hematocrit 36.0 - 46.0 % 25.9  22.6  28.9   Platelets 150 - 400 K/uL 344  387  281     Lab Results  Component Value Date/Time   VD25OH 18 (L) 06/06/2022 11:00 AM    Clinical ASCVD: No  The ASCVD Risk score (Arnett DK, et al., 2019) failed to calculate for the following reasons:   The 2019 ASCVD risk score is only valid for ages 24 to 11        07/03/2022   10:10 AM 09/08/2021    2:41 PM 10/11/2020    9:52 AM  Depression screen PHQ 2/9  Decreased Interest 0 0 0  Down, Depressed, Hopeless 0 0 0  PHQ - 2 Score 0 0 0  Altered sleeping 0    Tired, decreased energy 0    Change in appetite 0    Feeling bad or failure about yourself  0    Trouble concentrating 0    Moving slowly or fidgety/restless 0    Suicidal thoughts 0    PHQ-9 Score 0    Difficult doing work/chores Not difficult at all       Social History   Tobacco Use  Smoking Status Never  Smokeless Tobacco Never   BP Readings from Last 3 Encounters:  09/30/22 117/63  09/27/22 115/64  09/03/22 (!) 157/84   Pulse Readings from Last 3 Encounters:  09/30/22 90  09/27/22 95  09/03/22 81   Wt Readings from Last 3 Encounters:  09/27/22 162 lb (73.5 kg)  09/01/22 144 lb 13.5 oz (65.7 kg)  07/03/22 145 lb (65.8 kg)   BMI Readings from Last 3 Encounters:  09/27/22 26.96 kg/m  09/01/22 24.10 kg/m  07/03/22 24.13 kg/m    Allergies  Allergen Reactions   Elemental Sulfur Hives   Naproxen Swelling and Other (See Comments)    Blacked out - Face swelling    Tramadol Nausea And Vomiting    Medications Reviewed Today     Reviewed by Edythe Clarity, [facility] (Pharmacist) on 10/03/22 at 24  Med List Status: <None>   Medication Order Taking? Sig Documenting Provider Last Dose Status Informant  BD POSIFLUSH 0.9 % SOLN injection [id number] Yes USE AS DIRECTED. Julie Maid, FNP Taking Active Self  Blood Glucose Monitoring Suppl (ACCU-CHEK GUIDE ME) w/Device KIT  [id number] Yes 1 kit by Does not apply route as directed. ACCU CHEK GUIDE ME TEST STRIPS WITH KIT 180 strips, with 4 refills. Lauree Chandler, NP Taking Active Self  ciprofloxacin (CIPRO) 500 MG tablet [id number] Yes Take 500 mg  by mouth daily. Starting 01.22.2024 x 5 days. [provider] Taking Active Self           Med Note Ahmed Prima, AMBER C   Sat Sep 30, 2022  3:12 PM) Completed course  glucose blood (ACCU-CHEK GUIDE) test strip [id number] Yes Use to test blood sugar twice daily. Dx: E11.22 Lauree Chandler, NP Taking Active Self  insulin detemir (LEVEMIR FLEXPEN) 100 UNIT/ML FlexPen [id number] Yes Inject 16 Units into the skin at bedtime. Julie Maid, FNP Taking Active Self  Insulin Pen Needle (B-D UF III MINI PEN NEEDLES) 31G X 5 MM MISC [id number] Yes USE WITH INJECTION AT BEDTIME Susy Frizzle, MD Taking Active Self  Propylene Glycol (SYSTANE BALANCE OP) [id number] Yes Apply 1 drop to eye daily as needed (dry eyes). [provider] Taking Active Self  traMADol (ULTRAM) 50 MG tablet [id number] Yes Take 50 mg by mouth daily. [provider] Taking Active Self            SDOH:  (Social Determinants of Health) assessments and interventions performed: Yes Financial Resource Strain: Low Risk  (10/03/2022)   Overall Financial Resource Strain (CARDIA)    Difficulty of Paying Living Expenses: Not hard at all   Food Insecurity: No Food Insecurity (10/03/2022)   Hunger Vital Sign    Worried About Running Out of Food in the Last Year: Never true    Ran Out of Food in the Last Year: Never true    SDOH Interventions    Flowsheet Row Telephone from 09/05/2022 in [place] Coordination Telephone from 06/02/2022 in [place] [facility]  SDOH Interventions    Food Insecurity Interventions Intervention Not Indicated Intervention Not Indicated  Transportation Interventions Intervention Not Indicated  Intervention Not Indicated       Medication Assistance: None required.  Patient affirms current coverage meets needs.  Medication Access: Within the past 30 days, how often has patient missed a dose of medication? 0 Is a pillbox or other method used to improve adherence? Yes  Factors that may affect medication adherence? no barriers identified Are meds synced by current pharmacy? No  Are meds delivered by current pharmacy? No  Does patient experience delays in picking up medications due to transportation concerns? No   [facility] Reviewed: Is patient disadvantaged to use [facility]?: No  Current Rx insurance plan: [employer] Name and location of Current pharmacy:  [place], [place] [id number] [place] [id number] Phone: [phone] Fax: [place], [place] [address] Phone: [phone] Fax: [place], [place] [address] [place] [address] Phone: [phone] Fax: [phone]  [id number]: [phone]: [phone] [facility] services reviewed with patient today?: Yes  Patient requests to transfer care to [facility]?: No  Reason patient declined to change pharmacies: Loyalty to other pharmacy/Patient preference  Compliance/Adherence/Medication fill history: [facility] Drugs:  Glipizide 5 mg last filled 07/22/2022 100 DS Levemir Flexpen last filled 09/27/2022 28 DS Tradjenta 5 mg last filled 08/29/2022 30 DS Simvastatin 40 mg last filled 08/06/2022 100 DS   Care Gaps: Annual wellness visit in last  year? No   If Diabetic: Last eye exam / retinopathy screening: 10/23/2017 Last  diabetic foot exam: 11/11/2021   Assessment/Plan   Hypertension (BP goal <130/80) -Controlled -Current treatment: None at this time -Medications previously tried: atenolol, losartan  -Current home readings: controlled at home, controlled in office -Current dietary habits: eating much better now that she is out of the hospital. -Current exercise habits: minimal, a little weak due to needing blood transfusions -Denies hypotensive/hypertensive symptoms -Educated on BP goals and benefits of medications for prevention of heart attack, stroke and kidney damage; Daily salt intake goal < 2300 mg; Exercise goal of 150 minutes per week; -Counseled to monitor BP at home if able, document, and provide log at future appointments -Recommended continue current management  Hyperlipidemia: (LDL goal < 70) -Controlled -Current treatment: None -Medications previously tried: simvastatin (renal function)  -Current dietary patterns: see DM -Current exercise habits: see DM -Educated on Cholesterol goals;  Benefits of statin for ASCVD risk reduction; Importance of limiting foods high in cholesterol; - Continue to keep LDL < 70.  Technically should be on statin with diabetes.  At this time will hold, recheck lipids at next complete phsyical.  If she needs a statin we could start atorvastatin 29m due to no need for renal adjustments.  Diabetes (A1c goal <8%) 10/06/22 -Uncontrolled, most recent A1c 9.0 -Current medications: Levemir 100units/ml 14 units Appropriate, Query effective,  Glipizide 2.552mdaily Appropriate, Query effective, ,  -Medications previously tried: Tradjenta, glipizide  -Current home glucose readings fasting glucose: 192 this morning - improved! post prandial glucose: unclear -Denies hypoglycemic/hyperglycemic symptoms -Current meal patterns:  breakfast: sausage biscuit, activia, hash brown  lunch: half carton of pimiento cheese, activia, fruit  dinner: wonton soup,  activia snacks:  drinks: sprite zero -Current exercise: minimal, until she gets next unit of blood -Educated on A1c and blood sugar goals; Complications of diabetes including kidney damage, retinal damage, and cardiovascular disease; Proper insulin injection technique; Prevention and management of hypoglycemic episodes; Benefits of routine self-monitoring of blood sugar; -Counseled to check feet daily and get yearly eye exams -Called 02/09 to check on glipizide addition.  She reports that today her fasting sugar was 192.  Denies any episodes of hypoglycemia.  We discussed a new goal of keeping her glucose around 200.  Patient agreeable to plan and will continue with just glipizide and Insulin.   10/04/22 -Today she tells me that her blood sugars are running very high.  This morning her fasting sugar was 339 and has seen readings > 400 recently.  I am concerned for DKA.  She took a dose of Tradjenta from old stock this morning due to high readings.  I would not recommend continuation of this due to pancreatic issues she has.  Two options:  bolus meal time insulin or low dose glipizide.  At this time I feel the best option would be glipizide 2.22m622mnce daily with her largest meal.  She will need to return for evaluation.  CMA to check in a few days on glucose levels with glipizide.  [facility] so we will know POC glucose at that time. Consulted with AmbMila MerryP to determine best course of action.   Pancreatic Mass  -Not ideally controlled -Current treatment  None -Medications previously tried: none -Pancreatic mass  that is possible malignancy.  She has declined invasive procedures on this - Consider investigative procedures on pancreatic mass if patient is agreeable.  ChrBeverly MilchharmD, CPP  Clinical Pharmacist Practitioner [place] [phone]

## 2022-10-09 NOTE — Progress Notes (Deleted)
Subjective:   Julie Baxter is a 87 y.o. female who presents for Medicare Annual (Subsequent) preventive examination.  Review of Systems    ***       Objective:    There were no vitals filed for this visit. There is no height or weight on file to calculate BMI.     09/01/2022    4:26 PM 05/26/2022    6:00 PM 05/12/2022    8:24 AM 05/11/2022    4:28 PM 02/06/2022    8:43 AM 12/23/2021    8:39 AM 11/11/2021    9:30 AM  Advanced Directives  Does Patient Have a Medical Advance Directive? No No Yes Yes Yes Yes Yes  Type of Advance Directive  Out of facility DNR (pink MOST or yellow form) Out of facility DNR (pink MOST or yellow form) Out of facility DNR (pink MOST or yellow form) Out of facility DNR (pink MOST or yellow form) Out of facility DNR (pink MOST or yellow form) Out of facility DNR (pink MOST or yellow form)  Does patient want to make changes to medical advance directive?  No - Patient declined No - Patient declined No - Patient declined No - Patient declined No - Patient declined No - Patient declined  Would patient like information on creating a medical advance directive? No - Patient declined No - Patient declined       Pre-existing out of facility DNR order (yellow form or pink MOST form)   Yellow form placed in chart (order not valid for inpatient use) Yellow form placed in chart (order not valid for inpatient use) Yellow form placed in chart (order not valid for inpatient use) Yellow form placed in chart (order not valid for inpatient use) Yellow form placed in chart (order not valid for inpatient use)    Current Medications (verified) Outpatient Encounter Medications as of 10/10/2022  Medication Sig   BD POSIFLUSH 0.9 % SOLN injection USE AS DIRECTED.   Blood Glucose Monitoring Suppl (ACCU-CHEK GUIDE ME) w/Device KIT 1 kit by Does not apply route as directed. ACCU CHEK GUIDE ME TEST STRIPS WITH KIT 180 strips, with 4 refills.   ciprofloxacin (CIPRO) 500 MG tablet Take  500 mg by mouth daily. Starting 01.22.2024 x 5 days.   glucose blood (ACCU-CHEK GUIDE) test strip Use to test blood sugar twice daily. Dx: E11.22   insulin detemir (LEVEMIR FLEXPEN) 100 UNIT/ML FlexPen Inject 16 Units into the skin at bedtime.   Insulin Pen Needle (B-D UF III MINI PEN NEEDLES) 31G X 5 MM MISC USE WITH INJECTION AT BEDTIME   Propylene Glycol (SYSTANE BALANCE OP) Apply 1 drop to eye daily as needed (dry eyes).   traMADol (ULTRAM) 50 MG tablet Take 50 mg by mouth daily.   No facility-administered encounter medications on file as of 10/10/2022.    Allergies (verified) Elemental sulfur, Naproxen, and Tramadol   History: Past Medical History:  Diagnosis Date   Anxiety    regarding surgery; occassionally pt. states   Anxiety state, unspecified    Arthritis    Asymptomatic varicose veins    Broken arm    Chronic kidney disease    Diabetic retinopathy ([place])    Edema    Hard of hearing    Hyperpotassemia    Hypertension    Inflammation of joint of knee    right knee   Insomnia, unspecified    Multiple thyroid nodules    Osteoarthrosis, unspecified whether generalized or localized, unspecified site  Other abnormal blood chemistry    Other and unspecified hyperlipidemia    Other malaise and fatigue    Other specified disease of nail    Overweight(278.02)    Pain in joint, lower leg    Pain in joint, pelvic region and thigh    Peripheral vascular disease ([place])    varicose veins-  "Blood Clot anterior left LEG WITH PREGNANCY"   PONV (postoperative nausea and vomiting)    pt. states that she was sick after hip surgery and oral surgery   Type II or unspecified type diabetes mellitus with renal manifestations, not stated as uncontrolled(250.40)    Undiagnosed cardiac murmurs    Unspecified disorder of kidney and ureter    Unspecified disorder of kidney and ureter    Urinary tract infection, site not specified    Past Surgical History:  Procedure Laterality Date    ABDOMINAL HYSTERECTOMY     EYE SURGERY Bilateral    cataract extraction with IOL   IR EXCHANGE BILIARY DRAIN  07/13/2022   LIPOMA EXCISION     x2  bilateral buttocks   spinal meningitis  1957   TONSILLECTOMY     TOTAL HIP ARTHROPLASTY Right 11/29/2012   Procedure: RIGHT TOTAL HIP ARTHROPLASTY ANTERIOR APPROACH;  Surgeon: Mcarthur Rossetti, MD;  Location: [facility];  Service: Orthopedics;  Laterality: Right;   TOTAL HIP ARTHROPLASTY Left 06/01/2015   Procedure: LEFT TOTAL HIP ARTHROPLASTY ANTERIOR APPROACH;  Surgeon: Mcarthur Rossetti, MD;  Location: [facility];  Service: Orthopedics;  Laterality: Left;   No family history on file. Social History   Socioeconomic History   Marital status: Single    Spouse name: Not on file   Number of children: Not on file   Years of education: Not on file   Highest education level: Not on file  Occupational History   Not on file  Tobacco Use   Smoking status: Never   Smokeless tobacco: Never  Vaping Use   Vaping Use: Never used  Substance and Sexual Activity   Alcohol use: No   Drug use: No   Sexual activity: Not Currently  Other Topics Concern   Not on file  Social History Narrative   Not on file   Social Determinants of Health   Financial Resource Strain: Low Risk  (10/03/2022)   Overall Financial Resource Strain (CARDIA)    Difficulty of Paying Living Expenses: Not hard at all  Food Insecurity: No Food Insecurity (10/03/2022)   Hunger Vital Sign    Worried About Running Out of Food in the Last Year: Never true    [place] in the Last Year: Never true  Transportation Needs: No Transportation Needs (09/05/2022)   PRAPARE - Hydrologist (Medical): No    Lack of Transportation (Non-Medical): No  Physical Activity: Not on file  Stress: Not on file  Social Connections: Not on file    Tobacco Counseling Counseling given: Not Answered   Clinical Intake:                 Diabetic?Yes    Nutrition Risk Assessment:  Has the patient had any N/V/D within the last 2 months?  {YES/NO:21197} Does the patient have any non-healing wounds?  {YES/NO:21197} Has the patient had any unintentional weight loss or weight gain?  {YES/NO:21197}  Diabetes:  Is the patient diabetic?  {YES/NO:21197} If diabetic, was a CBG obtained today?  {YES/NO:21197} Did the patient bring in their glucometer from home?  {  YES/NO:21197} How often do you monitor your CBG's? ***.   Financial Strains and Diabetes Management:  Are you having any financial strains with the device, your supplies or your medication? {YES/NO:21197}.  Does the patient want to be seen by Chronic Care Management for management of their diabetes?  {YES/NO:21197} Would the patient like to be referred to a Nutritionist or for Diabetic Management?  {YES/NO:21197}  Diabetic Exams:  {Diabetic Eye Exam:2101801} Diabetic Foot Exam: Completed 11/11/21          Activities of Daily Living    09/01/2022    4:26 PM  In your present state of health, do you have any difficulty performing the following activities:  Hearing? 0  Vision? 0  Difficulty concentrating or making decisions? 0  Walking or climbing stairs? 1  Dressing or bathing? 0  Doing errands, shopping? 0    Patient Care Team: Rubie Maid, FNP as PCP - General (Family Medicine) Zadie Rhine Clent Demark, MD as Consulting Physician (Ophthalmology)  Indicate any recent Medical Services you may have received from other than [facility] providers in the past year (date may be approximate).     Assessment:   This is a routine wellness examination for Dariela.  Hearing/Vision screen No results found.  Dietary issues and exercise activities discussed:     Goals Addressed   None    Depression Screen    07/03/2022   10:10 AM 09/08/2021    2:41 PM 10/11/2020    9:52 AM 12/12/2018    2:34 PM 06/13/2018   10:12 AM 06/13/2018    9:14 AM 09/13/2017    9:52 AM  PHQ 2/9 Scores   PHQ - 2 Score 0 0 0 0 0 0 0  PHQ- 9 Score 0    0      Fall Risk    09/27/2022   11:14 AM 05/12/2022    8:48 AM 02/06/2022    8:43 AM 12/23/2021    8:35 AM 11/11/2021    9:28 AM  Fall Risk   Falls in the past year? 0 1 0 1 1  Number falls in past yr:  0 0 0 0  Injury with Fall?  0 0 1 1  Comment  Jammed back.  Broke arm Broke bone in left arm  Risk for fall due to :  No Fall Risks No Fall Risks History of fall(s) History of fall(s)  Follow up  Falls evaluation completed Falls evaluation completed Falls evaluation completed Falls evaluation completed    [place]:  Any stairs in or around the home? {YES/NO:21197} If so, are there any without handrails? {YES/NO:21197} Home free of loose throw rugs in walkways, pet beds, electrical cords, etc? {YES/NO:21197} Adequate lighting in your home to reduce risk of falls? {YES/NO:21197}  ASSISTIVE DEVICES UTILIZED TO PREVENT FALLS:  Life alert? {YES/NO:21197} Use of a cane, walker or w/c? {YES/NO:21197} Grab bars in the bathroom? {YES/NO:21197} Shower chair or bench in shower? {YES/NO:21197} Elevated toilet seat or a handicapped toilet? {YES/NO:21197}  TIMED UP AND GO:  Was the test performed? {YES/NO:21197}. Telephonic visit   Cognitive Function:    02/23/2017    9:00 AM 11/06/2014    9:09 AM  MMSE - Mini Mental State Exam  Orientation to time 5 5  Orientation to Place 5 4  Registration 3 3  Attention/ Calculation 5 5  Recall 2 3  Language- name 2 objects 2 2  Language- repeat 1 1  Language- follow 3 step  command 3 3  Language- read & follow direction 1 1  Write a sentence 1 1  Copy design 1 0  Total score 29 28        09/08/2021    2:44 PM  6CIT Screen  What Year? 0 points  What month? 0 points  What time? 0 points  Count back from 20 0 points  Months in reverse 0 points  Repeat phrase 6 points  Total Score 6 points    Immunizations Immunization History  Administered Date(s)  Administered   Fluad Quad(high Dose 65+) 06/19/2019   Influenza Whole 05/28/2012   Influenza, High Dose Seasonal PF 06/13/2018   Influenza,inj,Quad PF,6+ Mos 07/17/2016   Influenza-Unspecified 07/02/2017   Moderna Sars-Covid-2 Vaccination 05/21/2020   Pneumococcal Conjugate-13 11/06/2014   Pneumococcal Polysaccharide-23 02/23/2017    TDAP status: Due, Education has been provided regarding the importance of this vaccine. Advised may receive this vaccine at local pharmacy or Health Dept. Aware to provide a copy of the vaccination record if obtained from local pharmacy or Health Dept. Verbalized acceptance and understanding.  Flu Vaccine status: Declined, Education has been provided regarding the importance of this vaccine but patient still declined. Advised may receive this vaccine at local pharmacy or Health Dept. Aware to provide a copy of the vaccination record if obtained from local pharmacy or Health Dept. Verbalized acceptance and understanding.  Pneumococcal vaccine status: Up to date  Covid-19 vaccine status: Information provided on how to obtain vaccines.   Qualifies for Shingles Vaccine? Yes   Zostavax completed No   Shingrix Completed?: No.    Education has been provided regarding the importance of this vaccine. Patient has been advised to call insurance company to determine out of pocket expense if they have not yet received this vaccine. Advised may also receive vaccine at local pharmacy or Health Dept. Verbalized acceptance and understanding.  Screening Tests Health Maintenance  Topic Date Due   OPHTHALMOLOGY EXAM  10/23/2018   COVID-19 Vaccine (2 - Moderna risk series) 06/18/2020   INFLUENZA VACCINE  11/26/2022 (Originally 03/28/2022)   Zoster Vaccines- Shingrix (1 of 2) 12/26/2022 (Originally 04/11/1955)   DEXA SCAN  08/29/2023 (Originally 04/12/2003)   FOOT EXAM  11/12/2022   HEMOGLOBIN A1C  03/12/2023   Pneumonia Vaccine 22+ Years old  Completed   HPV VACCINES  Aged Out    DTaP/Tdap/Td  Discontinued    Health Maintenance  Health Maintenance Due  Topic Date Due   OPHTHALMOLOGY EXAM  10/23/2018   COVID-19 Vaccine (2 - Moderna risk series) 06/18/2020    Colorectal cancer screening: No longer required.   Mammogram status: No longer required due to age.  {Bone Density status:21018021}  Lung Cancer Screening: (Low Dose CT Chest recommended if Age 66-80 years, 30 pack-year currently smoking OR have quit w/in 15years.) does not qualify.   Lung Cancer Screening Referral: n/a   Additional Screening:  Hepatitis C Screening: does not qualify  Vision Screening: Recommended annual ophthalmology exams for early detection of glaucoma and other disorders of the eye. Is the patient up to date with their annual eye exam?  {YES/NO:21197} Who is the provider or what is the name of the office in which the patient attends annual eye exams? *** If pt is not established with a provider, would they like to be referred to a provider to establish care? {YES/NO:21197}.   Dental Screening: Recommended annual dental exams for proper oral hygiene  Community Resource Referral / Chronic Care Management: CRR required this visit?  {  YES/NO:21197}  CCM required this visit?  {YES/NO:21197}     Plan:     I have personally reviewed and noted the following in the patient's chart:   Medical and social history Use of alcohol, tobacco or illicit drugs  Current medications and supplements including opioid prescriptions. {Opioid Prescriptions:[phone]} Functional ability and status Nutritional status Physical activity Advanced directives List of other physicians Hospitalizations, surgeries, and ER visits in previous 12 months Vitals Screenings to include cognitive, depression, and falls Referrals and appointments  In addition, I have reviewed and discussed with patient certain preventive protocols, quality metrics, and best practice recommendations. A written  personalized care plan for preventive services as well as general preventive health recommendations were provided to patient.     Denman George [place], [place]   [id number]   Nurse Notes: ***

## 2022-10-09 NOTE — Patient Instructions (Incomplete)
Julie Baxter , Thank you for taking time to come for your Medicare Wellness Visit. I appreciate your ongoing commitment to your health goals. Please review the following plan we discussed and let me know if I can assist you in the future.   These are the goals we discussed:  Goals      Blood Pressure < 140/90     Between 110-140/60-90        This is a list of the screening recommended for you and due dates:  Health Maintenance  Topic Date Due   Eye exam for diabetics  10/23/2018   COVID-19 Vaccine (2 - Moderna risk series) 06/18/2020   Medicare Annual Wellness Visit  09/08/2022   Flu Shot  11/26/2022*   Zoster (Shingles) Vaccine (1 of 2) 12/26/2022*   DEXA scan (bone density measurement)  08/29/2023*   Complete foot exam   11/12/2022   Hemoglobin A1C  03/12/2023   Yearly kidney health urinalysis for diabetes  09/28/2023   Yearly kidney function blood test for diabetes  10/01/2023   Pneumonia Vaccine  Completed   HPV Vaccine  Aged Out   DTaP/Tdap/Td vaccine  Discontinued  *Topic was postponed. The date shown is not the original due date.    Advanced directives:   Conditions/risks identified: Aim for 30 minutes of exercise or brisk walking, 6-8 glasses of water, and 5 servings of fruits and vegetables each day.   Next appointment: Follow up in one year for your annual wellness visit    Preventive Care 65 Years and Older, Female Preventive care refers to lifestyle choices and visits with your health care provider that can promote health and wellness. What does preventive care include? A yearly physical exam. This is also called an annual well check. Dental exams once or twice a year. Routine eye exams. Ask your health care provider how often you should have your eyes checked. Personal lifestyle choices, including: Daily care of your teeth and gums. Regular physical activity. Eating a healthy diet. Avoiding tobacco and drug use. Limiting alcohol use. Practicing safe  sex. Taking low-dose aspirin every day. Taking vitamin and mineral supplements as recommended by your health care provider. What happens during an annual well check? The services and screenings done by your health care provider during your annual well check will depend on your age, overall health, lifestyle risk factors, and family history of disease. Counseling  Your health care provider may ask you questions about your: Alcohol use. Tobacco use. Drug use. Emotional well-being. Home and relationship well-being. Sexual activity. Eating habits. History of falls. Memory and ability to understand (cognition). Work and work Statistician. Reproductive health. Screening  You may have the following tests or measurements: Height, weight, and BMI. Blood pressure. Lipid and cholesterol levels. These may be checked every 5 years, or more frequently if you are over 67 years old. Skin check. Lung cancer screening. You may have this screening every year starting at age 59 if you have a 30-pack-year history of smoking and currently smoke or have quit within the past 15 years. Fecal occult blood test (FOBT) of the stool. You may have this test every year starting at age 66. Flexible sigmoidoscopy or colonoscopy. You may have a sigmoidoscopy every 5 years or a colonoscopy every 10 years starting at age 92. Hepatitis C blood test. Hepatitis B blood test. Sexually transmitted disease (STD) testing. Diabetes screening. This is done by checking your blood sugar (glucose) after you have not eaten for a while (fasting). You  may have this done every 1-3 years. Bone density scan. This is done to screen for osteoporosis. You may have this done starting at age 15. Mammogram. This may be done every 1-2 years. Talk to your health care provider about how often you should have regular mammograms. Talk with your health care provider about your test results, treatment options, and if necessary, the need for more  tests. Vaccines  Your health care provider may recommend certain vaccines, such as: Influenza vaccine. This is recommended every year. Tetanus, diphtheria, and acellular pertussis (Tdap, Td) vaccine. You may need a Td booster every 10 years. Zoster vaccine. You may need this after age 73. Pneumococcal 13-valent conjugate (PCV13) vaccine. One dose is recommended after age 84. Pneumococcal polysaccharide (PPSV23) vaccine. One dose is recommended after age 24. Talk to your health care provider about which screenings and vaccines you need and how often you need them. This information is not intended to replace advice given to you by your health care provider. Make sure you discuss any questions you have with your health care provider. Document Released: 09/10/2015 Document Revised: 05/03/2016 Document Reviewed: 06/15/2015 Elsevier Interactive Patient Education  2017 [place] Prevention in the Home Falls can cause injuries. They can happen to people of all ages. There are many things you can do to make your home safe and to help prevent falls. What can I do on the outside of my home? Regularly fix the edges of walkways and driveways and fix any cracks. Remove anything that might make you trip as you walk through a door, such as a raised step or threshold. Trim any bushes or trees on the path to your home. Use bright outdoor lighting. Clear any walking paths of anything that might make someone trip, such as rocks or tools. Regularly check to see if handrails are loose or broken. Make sure that both sides of any steps have handrails. Any raised decks and porches should have guardrails on the edges. Have any leaves, snow, or ice cleared regularly. Use sand or salt on walking paths during winter. Clean up any spills in your garage right away. This includes oil or grease spills. What can I do in the bathroom? Use night lights. Install grab bars by the toilet and in the tub and shower.  Do not use towel bars as grab bars. Use non-skid mats or decals in the tub or shower. If you need to sit down in the shower, use a plastic, non-slip stool. Keep the floor dry. Clean up any water that spills on the floor as soon as it happens. Remove soap buildup in the tub or shower regularly. Attach bath mats securely with double-sided non-slip rug tape. Do not have throw rugs and other things on the floor that can make you trip. What can I do in the bedroom? Use night lights. Make sure that you have a light by your bed that is easy to reach. Do not use any sheets or blankets that are too big for your bed. They should not hang down onto the floor. Have a firm chair that has side arms. You can use this for support while you get dressed. Do not have throw rugs and other things on the floor that can make you trip. What can I do in the kitchen? Clean up any spills right away. Avoid walking on wet floors. Keep items that you use a lot in easy-to-reach places. If you need to reach something above you, use a strong  step stool that has a grab bar. Keep electrical cords out of the way. Do not use floor polish or wax that makes floors slippery. If you must use wax, use non-skid floor wax. Do not have throw rugs and other things on the floor that can make you trip. What can I do with my stairs? Do not leave any items on the stairs. Make sure that there are handrails on both sides of the stairs and use them. Fix handrails that are broken or loose. Make sure that handrails are as long as the stairways. Check any carpeting to make sure that it is firmly attached to the stairs. Fix any carpet that is loose or worn. Avoid having throw rugs at the top or bottom of the stairs. If you do have throw rugs, attach them to the floor with carpet tape. Make sure that you have a light switch at the top of the stairs and the bottom of the stairs. If you do not have them, ask someone to add them for you. What else  can I do to help prevent falls? Wear shoes that: Do not have high heels. Have rubber bottoms. Are comfortable and fit you well. Are closed at the toe. Do not wear sandals. If you use a stepladder: Make sure that it is fully opened. Do not climb a closed stepladder. Make sure that both sides of the stepladder are locked into place. Ask someone to hold it for you, if possible. Clearly mark and make sure that you can see: Any grab bars or handrails. First and last steps. Where the edge of each step is. Use tools that help you move around (mobility aids) if they are needed. These include: Canes. Walkers. Scooters. Crutches. Turn on the lights when you go into a dark area. Replace any light bulbs as soon as they burn out. Set up your furniture so you have a clear path. Avoid moving your furniture around. If any of your floors are uneven, fix them. If there are any pets around you, be aware of where they are. Review your medicines with your doctor. Some medicines can make you feel dizzy. This can increase your chance of falling. Ask your doctor what other things that you can do to help prevent falls. This information is not intended to replace advice given to you by your health care provider. Make sure you discuss any questions you have with your health care provider. Document Released: 06/10/2009 Document Revised: 01/20/2016 Document Reviewed: 09/18/2014 Elsevier Interactive Patient Education  2017 [employer].

## 2022-10-10 ENCOUNTER — Telehealth: Payer: Self-pay

## 2022-10-10 NOTE — Telephone Encounter (Signed)
FYI : so I have you spoke to Ms. Labus today? I called her this morning to set up an appt for lab. However, pt's daughter, Jackelyn Poling, stated that pt has transition into hospice care this morning and will no longer need our service.

## 2022-10-27 DEATH — deceased

## 2022-11-21 ENCOUNTER — Encounter (INDEPENDENT_AMBULATORY_CARE_PROVIDER_SITE_OTHER): Payer: Medicare Other | Admitting: Ophthalmology

## 2023-03-13 ENCOUNTER — Other Ambulatory Visit: Payer: Self-pay
